# Patient Record
Sex: Male | Born: 1985
Health system: Southern US, Community
[De-identification: ages and names within clinical notes are randomized; demographics above are authoritative.]

## PROBLEM LIST (undated history)

## (undated) DIAGNOSIS — Z21 Asymptomatic human immunodeficiency virus [HIV] infection status: Secondary | ICD-10-CM

## (undated) DIAGNOSIS — D693 Immune thrombocytopenic purpura: Secondary | ICD-10-CM

## (undated) DIAGNOSIS — B20 Human immunodeficiency virus [HIV] disease: Secondary | ICD-10-CM

## (undated) HISTORY — DX: Human immunodeficiency virus (HIV) disease: B20

## (undated) HISTORY — DX: Asymptomatic human immunodeficiency virus (hiv) infection status: Z21

## (undated) HISTORY — DX: Immune thrombocytopenic purpura: D69.3

---

## 2008-12-04 ENCOUNTER — Emergency Department (HOSPITAL_COMMUNITY): Admission: EM | Admit: 2008-12-04 | Discharge: 2008-12-04 | Payer: Self-pay | Admitting: Emergency Medicine

## 2009-04-04 ENCOUNTER — Emergency Department (HOSPITAL_COMMUNITY): Admission: EM | Admit: 2009-04-04 | Discharge: 2009-04-04 | Payer: Self-pay | Admitting: Emergency Medicine

## 2010-03-06 ENCOUNTER — Emergency Department (HOSPITAL_COMMUNITY): Admission: EM | Admit: 2010-03-06 | Discharge: 2010-03-06 | Payer: Self-pay | Admitting: Family Medicine

## 2011-08-09 ENCOUNTER — Emergency Department (HOSPITAL_COMMUNITY)
Admission: EM | Admit: 2011-08-09 | Discharge: 2011-08-09 | Disposition: A | Discharge status: Home / Self Care | Payer: Self-pay | Attending: Emergency Medicine | Admitting: Emergency Medicine

## 2011-08-09 DIAGNOSIS — J329 Chronic sinusitis, unspecified: Secondary | ICD-10-CM | POA: Insufficient documentation

## 2011-08-09 DIAGNOSIS — H9209 Otalgia, unspecified ear: Secondary | ICD-10-CM | POA: Insufficient documentation

## 2011-08-09 DIAGNOSIS — J3489 Other specified disorders of nose and nasal sinuses: Secondary | ICD-10-CM | POA: Insufficient documentation

## 2011-08-09 LAB — RAPID STREP SCREEN (MED CTR MEBANE ONLY): Streptococcus, Group A Screen (Direct): NEGATIVE

## 2012-05-07 ENCOUNTER — Emergency Department (HOSPITAL_COMMUNITY)
Admission: EM | Admit: 2012-05-07 | Discharge: 2012-05-07 | Disposition: A | Discharge status: Home / Self Care | Payer: Self-pay | Attending: Emergency Medicine | Admitting: Emergency Medicine

## 2012-05-07 ENCOUNTER — Encounter (HOSPITAL_COMMUNITY): Payer: Self-pay | Admitting: Emergency Medicine

## 2012-05-07 DIAGNOSIS — F17200 Nicotine dependence, unspecified, uncomplicated: Secondary | ICD-10-CM | POA: Insufficient documentation

## 2012-05-07 DIAGNOSIS — K0500 Acute gingivitis, plaque induced: Secondary | ICD-10-CM

## 2012-05-07 DIAGNOSIS — K0889 Other specified disorders of teeth and supporting structures: Secondary | ICD-10-CM

## 2012-05-07 DIAGNOSIS — K089 Disorder of teeth and supporting structures, unspecified: Secondary | ICD-10-CM | POA: Insufficient documentation

## 2012-05-07 MED ORDER — PENICILLIN V POTASSIUM 500 MG PO TABS: 500.0000 mg | ORAL_TABLET | Freq: Three times a day (TID) | ORAL | Status: AC

## 2012-05-07 MED ORDER — HYDROCODONE-ACETAMINOPHEN 5-325 MG PO TABS: 2.0000 | ORAL_TABLET | ORAL | Status: AC | PRN

## 2012-05-07 NOTE — ED Notes (Addendum)
C/o toothache and gums bleeding x 1 week.

## 2012-05-07 NOTE — ED Provider Notes (Signed)
History     CSN: [id number]  Arrival date & time 05/07/12  1826   First MD Initiated Contact with Patient 05/07/12 1930      Chief Complaint  Patient presents with  . Dental Pain    (Consider location/radiation/quality/duration/timing/severity/associated sxs/prior treatment) HPI Comments: Patient presents with lower incisor teeth pain and gingival pain and swelling for the past week.  He does not have a dentist.    Patient is a 26 y.o. male presenting with tooth pain. The history is provided by the patient.  Dental PainThe primary symptoms include mouth pain and oral bleeding. Primary symptoms do not include dental injury, oral lesions or fever. The symptoms are worsening. The symptoms are new. The symptoms occur constantly.  Additional symptoms include: dental sensitivity to temperature, gum swelling and gum tenderness. Additional symptoms do not include: purulent gums, trismus, facial swelling, trouble swallowing, pain with swallowing, excessive salivation and drooling.    History reviewed. No pertinent past medical history.  History reviewed. No pertinent past surgical history.  No family history on file.  History  Substance Use Topics  . Smoking status: Current Everyday Smoker  . Smokeless tobacco: Not on file  . Alcohol Use: No      Review of Systems  Constitutional: Negative for fever and chills.  HENT: Positive for dental problem. Negative for facial swelling, drooling, trouble swallowing, neck pain, neck stiffness and voice change.   Gastrointestinal: Negative for nausea and vomiting.  Skin: Negative for color change.    Allergies  Review of patient's allergies indicates no known allergies.  Home Medications  No current outpatient prescriptions on file.  BP 126/85  Pulse 60  Temp 98.5 F (36.9 C) (Oral)  Resp 17  Ht 6\' 2"  (1.88 m)  Wt 180 lb (81.647 kg)  BMI 23.11 kg/m2  SpO2 99%  Physical Exam  Nursing note and vitals reviewed. Constitutional: He  is oriented to person, place, and time. He appears well-developed and well-nourished. No distress.  HENT:  Head: Normocephalic and atraumatic. No trismus in the jaw.  Mouth/Throat: Uvula is midline, oropharynx is clear and moist and mucous membranes are normal. Abnormal dentition. No dental abscesses or uvula swelling. No oropharyngeal exudate, posterior oropharyngeal edema, posterior oropharyngeal erythema or tonsillar abscesses.       Poor dental hygiene. Pt able to open and close mouth with out difficulty. Airway intact. Uvula midline. Mild gingival swelling with tenderness over affected area, but no fluctuance. No swelling or tenderness of submental and submandibular regions.  Neck: Normal range of motion and full passive range of motion without pain. Neck supple.  Cardiovascular: Normal rate, regular rhythm and normal heart sounds.   Pulmonary/Chest: Effort normal and breath sounds normal. No respiratory distress. He has no wheezes.  Musculoskeletal: Normal range of motion.  Lymphadenopathy:       Head (right side): No submental, no submandibular, no tonsillar, no preauricular and no posterior auricular adenopathy present.       Head (left side): No submental, no submandibular, no tonsillar, no preauricular and no posterior auricular adenopathy present.    He has no cervical adenopathy.  Neurological: He is alert and oriented to person, place, and time.  Skin: Skin is warm and dry. No rash noted. He is not diaphoretic.    ED Course  Procedures (including critical care time)  Labs Reviewed - No data to display No results found.   No diagnosis found.    MDM  Patient with toothache.  No gross abscess.  Exam unconcerning for Ludwig's angina or spread of infection.  Will treat with penicillin and pain medicine.  Urged patient to follow-up with dentist.          Pascal Lux Epes, PA-C 05/08/12 0131

## 2012-05-08 NOTE — ED Provider Notes (Signed)
Medical screening examination/treatment/procedure(s) were performed by non-physician practitioner and as supervising physician I was immediately available for consultation/collaboration.  Juliet Rude. Rubin Payor, MD 05/08/12 [phone]

## 2012-06-24 ENCOUNTER — Emergency Department (HOSPITAL_COMMUNITY)
Admission: EM | Admit: 2012-06-24 | Discharge: 2012-06-24 | Disposition: A | Discharge status: Home / Self Care | Payer: Self-pay | Attending: Emergency Medicine | Admitting: Emergency Medicine

## 2012-06-24 DIAGNOSIS — J329 Chronic sinusitis, unspecified: Secondary | ICD-10-CM | POA: Insufficient documentation

## 2012-06-24 DIAGNOSIS — B9789 Other viral agents as the cause of diseases classified elsewhere: Secondary | ICD-10-CM | POA: Insufficient documentation

## 2012-06-24 MED ORDER — AMOXICILLIN-POT CLAVULANATE 875-125 MG PO TABS: 1.0000 | ORAL_TABLET | Freq: Two times a day (BID) | ORAL | Status: DC

## 2012-06-24 MED ORDER — CETIRIZINE-PSEUDOEPHEDRINE ER 5-120 MG PO TB12: 1.0000 | ORAL_TABLET | Freq: Every day | ORAL | Status: DC

## 2012-06-24 MED ORDER — OXYMETAZOLINE HCL 0.05 % NA SOLN
1.0000 | Freq: Once | NASAL | Status: AC
  Administered 2012-06-24: 1 via NASAL
  Filled 2012-06-24: qty 15

## 2012-06-24 MED ORDER — MOMETASONE FUROATE 50 MCG/ACT NA SUSP: 2.0000 | Freq: Every day | NASAL | Status: DC

## 2012-06-24 NOTE — ED Notes (Signed)
Pt verbalizes understanding 

## 2012-06-24 NOTE — ED Notes (Signed)
Pt reports feeling dizzy, congestion and "stuffy ears"  Unrelieved by any over the counter meds.  Pt denies chills or fever.  Pt reports bleeding/sensitive gums

## 2012-06-26 NOTE — ED Notes (Signed)
Pt was prescribed Nasonex by [facility] PA, it was too expensive for pt per pharmacist.  PA Kirichenko wrote for Flonase 1 bottle, sig: 2 sprays in each nostril Q day.  Called into [facility] at [phone].

## 2012-07-05 NOTE — ED Provider Notes (Signed)
History     CSN: [id number]  Arrival date & time 06/24/12  1858   First MD Initiated Contact with Patient 06/24/12 1945      Chief Complaint  Patient presents with  . Facial Pain    (Consider location/radiation/quality/duration/timing/severity/associated sxs/prior treatment) HPI Comments: Patient presents with a 2 day history of sinus congestion. The symptoms started gradually and are progressively worsening. The patient has tried over the counter medication without relief. Patient reports associated dizziness, sensitive gums and "stuffy ears." Patient denies fever, chills, SOB, cough, chest pain, NVD, abdominal pain.    No past medical history on file.  No past surgical history on file.  No family history on file.  History  Substance Use Topics  . Smoking status: Current Every Day Smoker  . Smokeless tobacco: Not on file  . Alcohol Use: No      Review of Systems  HENT: Positive for congestion and sinus pressure.   Neurological: Positive for dizziness.  All other systems reviewed and are negative.    Allergies  Review of patient's allergies indicates no known allergies.  Home Medications   Current Outpatient Rx  Name Route Sig Dispense Refill  . AMOXICILLIN-POT CLAVULANATE 875-125 MG PO TABS Oral Take 1 tablet by mouth every 12 (twelve) hours. 14 tablet 0    Fill this prescription if you develop a fever grea ...  . CETIRIZINE-PSEUDOEPHEDRINE ER 5-120 MG PO TB12 Oral Take 1 tablet by mouth daily. 30 tablet 0  . MOMETASONE FUROATE 50 MCG/ACT NA SUSP Nasal Place 2 sprays into the nose daily. 17 g 12    BP 123/80  Pulse 70  Temp 98.1 F (36.7 C) (Oral)  Resp 14  SpO2 100%  Physical Exam  Nursing note and vitals reviewed. Constitutional: He is oriented to person, place, and time. He appears well-developed and well-nourished. No distress.  HENT:  Head: Normocephalic and atraumatic.  Mouth/Throat: Oropharynx is clear and moist. No oropharyngeal exudate.    Facial tenderness to palpation over maxillary and ethmoid sinuses.   Eyes: Conjunctivae normal and EOM are normal. Pupils are equal, round, and reactive to light. No scleral icterus.  Neck: Normal range of motion. Neck supple.  Cardiovascular: Normal rate and regular rhythm.  Exam reveals no gallop and no friction rub.   No murmur heard. Pulmonary/Chest: Effort normal. No respiratory distress. He has no wheezes. He has no rales. He exhibits no tenderness.  Abdominal: Soft. He exhibits no distension. There is no tenderness. There is no rebound.  Musculoskeletal: Normal range of motion.  Neurological: He is alert and oriented to person, place, and time. Coordination normal.  Skin: Skin is warm and dry. He is not diaphoretic.  Psychiatric: He has a normal mood and affect. His behavior is normal.    ED Course  Procedures (including critical care time)  Labs Reviewed - No data to display No results found.   1. Viral sinusitis       MDM  4:16 PM Patient most likely experiencing sinusitis that appears viral. I will treat him symptomatically with Zyrtec-D and Nasonex. I have instructed him to fill the prescription for augmentin if symptoms do not improve within 2 days. No further evaluation needed at this time.         Emilia Beck, PA-C 07/05/12 1623

## 2012-07-07 NOTE — ED Provider Notes (Signed)
Medical screening examination/treatment/procedure(s) were performed by non-physician practitioner and as supervising physician I was immediately available for consultation/collaboration.  Faysal Fenoglio T Bronnie Vasseur, MD 07/07/12 0806 

## 2013-10-17 ENCOUNTER — Emergency Department (HOSPITAL_COMMUNITY)
Admission: EM | Admit: 2013-10-17 | Discharge: 2013-10-17 | Disposition: A | Discharge status: Home / Self Care | Payer: Self-pay | Attending: Emergency Medicine | Admitting: Emergency Medicine

## 2013-10-17 ENCOUNTER — Encounter (HOSPITAL_COMMUNITY): Payer: Self-pay | Admitting: Emergency Medicine

## 2013-10-17 DIAGNOSIS — J329 Chronic sinusitis, unspecified: Secondary | ICD-10-CM | POA: Insufficient documentation

## 2013-10-17 DIAGNOSIS — K029 Dental caries, unspecified: Secondary | ICD-10-CM | POA: Insufficient documentation

## 2013-10-17 DIAGNOSIS — R040 Epistaxis: Secondary | ICD-10-CM | POA: Insufficient documentation

## 2013-10-17 DIAGNOSIS — F17200 Nicotine dependence, unspecified, uncomplicated: Secondary | ICD-10-CM | POA: Insufficient documentation

## 2013-10-17 MED ORDER — AMOXICILLIN-POT CLAVULANATE 875-125 MG PO TABS
1.0000 | ORAL_TABLET | Freq: Once | ORAL | Status: AC
  Administered 2013-10-17: 1 via ORAL
  Filled 2013-10-17: qty 1

## 2013-10-17 MED ORDER — AMOXICILLIN-POT CLAVULANATE 875-125 MG PO TABS: 1.0000 | ORAL_TABLET | Freq: Two times a day (BID) | ORAL | Status: DC

## 2013-10-17 MED ORDER — IPRATROPIUM BROMIDE 0.03 % NA SOLN
2.0000 | Freq: Two times a day (BID) | NASAL | Status: DC
  Administered 2013-10-17: 2 via NASAL
  Filled 2013-10-17: qty 30

## 2013-10-17 NOTE — ED Provider Notes (Signed)
CSN: [id number]     Arrival date & time 10/17/13  0001 History   First MD Initiated Contact with Patient 10/17/13 0017     Chief Complaint  Patient presents with  . Influenza   (Consider location/radiation/quality/duration/timing/severity/associated sxs/prior Treatment) Patient is a 28 y.o. male presenting with flu symptoms. The history is provided by the patient.  Influenza Presenting symptoms: headache   Presenting symptoms: no cough, no diarrhea, no fatigue, no fever, no myalgias, no nausea, no rhinorrhea, no shortness of breath, no sore throat and no vomiting   Severity:  Moderate Onset quality:  Gradual Duration:  3 weeks Progression:  Unchanged Chronicity:  New Relieved by:  Nothing Worsened by:  Nothing tried Ineffective treatments:  Decongestant and OTC medications Associated symptoms: nasal congestion   Associated symptoms comment:  Intermittent nose bleed, burning sensation after blowing nose   History reviewed. No pertinent past medical history. History reviewed. No pertinent past surgical history. History reviewed. No pertinent family history. History  Substance Use Topics  . Smoking status: Current Every Day Smoker  . Smokeless tobacco: Not on file  . Alcohol Use: No    Review of Systems  Constitutional: Negative for fever and fatigue.  HENT: Positive for congestion and nosebleeds. Negative for rhinorrhea, sinus pressure, sore throat and voice change.   Respiratory: Negative for cough and shortness of breath.   Gastrointestinal: Negative for nausea, vomiting and diarrhea.  Musculoskeletal: Negative for myalgias.  Neurological: Positive for headaches.    Allergies  Review of patient's allergies indicates no known allergies.  Home Medications   Current Outpatient Rx  Name  Route  Sig  Dispense  Refill  . acetaminophen (TYLENOL) 500 MG tablet   Oral   Take 500 mg by mouth every 6 (six) hours as needed for mild pain or headache.          . ibuprofen  (ADVIL,MOTRIN) 200 MG tablet   Oral   Take 400 mg by mouth every 6 (six) hours as needed for fever, headache or moderate pain.         Marland Kitchen amoxicillin-clavulanate (AUGMENTIN) 875-125 MG per tablet   Oral   Take 1 tablet by mouth 2 (two) times daily.   13 tablet   0    BP 128/77  Pulse 92  Temp(Src) 98.2 F (36.8 C) (Oral)  Resp 18  SpO2 98% Physical Exam  Vitals reviewed. Constitutional: He is oriented to person, place, and time. He appears well-developed and well-nourished.  HENT:  Head: Normocephalic.  Mouth/Throat: Uvula is midline. Dental caries present. No uvula swelling. Posterior oropharyngeal erythema present. No oropharyngeal exudate or posterior oropharyngeal edema.  L ear canal reddened, R TM dull   Eyes: Pupils are equal, round, and reactive to light.  Neck: Normal range of motion.  Cardiovascular: Normal rate and regular rhythm.   Pulmonary/Chest: Effort normal and breath sounds normal.  Musculoskeletal: Normal range of motion.  Lymphadenopathy:       Head (right side): No tonsillar and no preauricular adenopathy present.       Head (left side): No tonsillar and no preauricular adenopathy present.    He has cervical adenopathy.       Right cervical: Superficial cervical adenopathy present.       Left cervical: Superficial cervical adenopathy present.  Neurological: He is alert and oriented to person, place, and time.  Skin: Skin is warm.    ED Course  Procedures (including critical care time) Labs Review Labs Reviewed - No data to  display Imaging Review No results found.  EKG Interpretation   None       MDM   1. Sinusitis     Patient obtained significant relief of his nasal congestion after the use of Atrovent nasal spray.  He will be discharged home with same with instructions to use 2 sprays to each nostril 4 times a day for 3-4, days.  He's also been given a prescription for Augmentin due to the length of his symptoms    Garald Balding,  NP 10/17/13 0245

## 2013-10-17 NOTE — Discharge Instructions (Signed)

## 2013-10-17 NOTE — ED Provider Notes (Signed)
Medical screening examination/treatment/procedure(s) were performed by non-physician practitioner and as supervising physician I was immediately available for consultation/collaboration.    Kalman Drape, MD 10/17/13 [phone]

## 2013-10-17 NOTE — ED Notes (Signed)
Pt arrived to the Ed with a complaint of flu like symptoms.  Pt is congested, has a runny nose, a headache from blowing his nose too hard, shortness of breath.  Pt has taken mucinex and tylenol without any relief.  Symptoms have been present for three weeks

## 2013-11-16 ENCOUNTER — Inpatient Hospital Stay (HOSPITAL_COMMUNITY)
Admission: AD | Admit: 2013-11-16 | Discharge: 2013-11-18 | DRG: 133 | Disposition: A | Discharge status: Home / Self Care | Payer: Self-pay | Source: Ambulatory Visit | Attending: Internal Medicine | Admitting: Internal Medicine

## 2013-11-16 ENCOUNTER — Encounter (HOSPITAL_COMMUNITY): Payer: Self-pay | Admitting: Emergency Medicine

## 2013-11-16 ENCOUNTER — Other Ambulatory Visit (HOSPITAL_COMMUNITY): Payer: Self-pay | Admitting: Otolaryngology

## 2013-11-16 ENCOUNTER — Inpatient Hospital Stay (HOSPITAL_COMMUNITY): Payer: Self-pay | Admitting: Certified Registered"

## 2013-11-16 ENCOUNTER — Other Ambulatory Visit: Payer: Self-pay | Admitting: Otolaryngology

## 2013-11-16 ENCOUNTER — Ambulatory Visit (HOSPITAL_COMMUNITY)
Admission: RE | Admit: 2013-11-16 | Discharge: 2013-11-16 | Disposition: A | Discharge status: Home / Self Care | Payer: Self-pay | Source: Ambulatory Visit | Attending: Otolaryngology | Admitting: Otolaryngology

## 2013-11-16 ENCOUNTER — Encounter (HOSPITAL_COMMUNITY): Payer: Self-pay | Admitting: Certified Registered"

## 2013-11-16 ENCOUNTER — Emergency Department (HOSPITAL_COMMUNITY)
Admission: EM | Admit: 2013-11-16 | Discharge: 2013-11-16 | Disposition: A | Discharge status: Home / Self Care | Payer: Self-pay | Attending: Emergency Medicine | Admitting: Emergency Medicine

## 2013-11-16 ENCOUNTER — Encounter (HOSPITAL_COMMUNITY): Payer: Self-pay | Admitting: *Deleted

## 2013-11-16 ENCOUNTER — Encounter (HOSPITAL_COMMUNITY): Payer: Self-pay

## 2013-11-16 ENCOUNTER — Encounter (HOSPITAL_COMMUNITY)
Admission: AD | Disposition: A | Discharge status: Home / Self Care | Payer: Self-pay | Source: Ambulatory Visit | Attending: Otolaryngology

## 2013-11-16 DIAGNOSIS — J392 Other diseases of pharynx: Secondary | ICD-10-CM | POA: Diagnosis present

## 2013-11-16 DIAGNOSIS — F17200 Nicotine dependence, unspecified, uncomplicated: Secondary | ICD-10-CM | POA: Diagnosis present

## 2013-11-16 DIAGNOSIS — B20 Human immunodeficiency virus [HIV] disease: Secondary | ICD-10-CM | POA: Diagnosis present

## 2013-11-16 DIAGNOSIS — H6690 Otitis media, unspecified, unspecified ear: Secondary | ICD-10-CM | POA: Diagnosis present

## 2013-11-16 DIAGNOSIS — R040 Epistaxis: Secondary | ICD-10-CM

## 2013-11-16 DIAGNOSIS — D489 Neoplasm of uncertain behavior, unspecified: Secondary | ICD-10-CM

## 2013-11-16 DIAGNOSIS — J352 Hypertrophy of adenoids: Principal | ICD-10-CM | POA: Diagnosis present

## 2013-11-16 DIAGNOSIS — R740 Nonspecific elevation of levels of transaminase and lactic acid dehydrogenase [LDH]: Secondary | ICD-10-CM

## 2013-11-16 DIAGNOSIS — R748 Abnormal levels of other serum enzymes: Secondary | ICD-10-CM | POA: Diagnosis present

## 2013-11-16 DIAGNOSIS — B1920 Unspecified viral hepatitis C without hepatic coma: Secondary | ICD-10-CM | POA: Diagnosis present

## 2013-11-16 DIAGNOSIS — D649 Anemia, unspecified: Secondary | ICD-10-CM | POA: Diagnosis present

## 2013-11-16 DIAGNOSIS — R768 Other specified abnormal immunological findings in serum: Secondary | ICD-10-CM | POA: Diagnosis present

## 2013-11-16 DIAGNOSIS — D696 Thrombocytopenia, unspecified: Secondary | ICD-10-CM | POA: Diagnosis present

## 2013-11-16 DIAGNOSIS — R7401 Elevation of levels of liver transaminase levels: Secondary | ICD-10-CM | POA: Diagnosis present

## 2013-11-16 DIAGNOSIS — H6520 Chronic serous otitis media, unspecified ear: Secondary | ICD-10-CM | POA: Diagnosis present

## 2013-11-16 DIAGNOSIS — Z79899 Other long term (current) drug therapy: Secondary | ICD-10-CM

## 2013-11-16 DIAGNOSIS — Z23 Encounter for immunization: Secondary | ICD-10-CM

## 2013-11-16 DIAGNOSIS — R599 Enlarged lymph nodes, unspecified: Secondary | ICD-10-CM | POA: Diagnosis present

## 2013-11-16 HISTORY — PX: MYRINGOTOMY WITH TUBE PLACEMENT: SHX5663

## 2013-11-16 HISTORY — PX: NASAL ENDOSCOPY WITH EPISTAXIS CONTROL: SHX5664

## 2013-11-16 HISTORY — PX: ADENOIDECTOMY: SHX5191

## 2013-11-16 LAB — CBC WITH DIFFERENTIAL/PLATELET
BASOS ABS: 0 10*3/uL (ref 0.0–0.1)
Basophils Relative: 1 % (ref 0–1)
Eosinophils Absolute: 0.1 10*3/uL (ref 0.0–0.7)
Eosinophils Relative: 1 % (ref 0–5)
HEMATOCRIT: 36 % — AB (ref 39.0–52.0)
Hemoglobin: 12.2 g/dL — ABNORMAL LOW (ref 13.0–17.0)
LYMPHS PCT: 37 % (ref 12–46)
Lymphs Abs: 1.9 10*3/uL (ref 0.7–4.0)
MCH: 27.9 pg (ref 26.0–34.0)
MCHC: 33.9 g/dL (ref 30.0–36.0)
MCV: 82.2 fL (ref 78.0–100.0)
MONO ABS: 0.8 10*3/uL (ref 0.1–1.0)
Monocytes Relative: 15 % — ABNORMAL HIGH (ref 3–12)
NEUTROS ABS: 2.3 10*3/uL (ref 1.7–7.7)
Neutrophils Relative %: 46 % (ref 43–77)
PLATELETS: 41 10*3/uL — AB (ref 150–400)
RBC: 4.38 MIL/uL (ref 4.22–5.81)
RDW: 14.3 % (ref 11.5–15.5)
WBC: 5 10*3/uL (ref 4.0–10.5)

## 2013-11-16 LAB — POCT I-STAT 4, (NA,K, GLUC, HGB,HCT)
Glucose, Bld: 103 mg/dL — ABNORMAL HIGH (ref 70–99)
HCT: 30 % — ABNORMAL LOW (ref 39.0–52.0)
Hemoglobin: 10.2 g/dL — ABNORMAL LOW (ref 13.0–17.0)
Potassium: 3.9 mEq/L (ref 3.7–5.3)
SODIUM: 139 meq/L (ref 137–147)

## 2013-11-16 LAB — PROTIME-INR
INR: 1.09 (ref 0.00–1.49)
PROTHROMBIN TIME: 13.9 s (ref 11.6–15.2)

## 2013-11-16 LAB — APTT: APTT: 39 s — AB (ref 24–37)

## 2013-11-16 LAB — HIV ANTIBODY (ROUTINE TESTING W REFLEX): HIV: REACTIVE — AB

## 2013-11-16 SURGERY — CONTROL OF EPISTAXIS, ENDOSCOPIC
Anesthesia: General | Site: Nose

## 2013-11-16 MED ORDER — CIPROFLOXACIN-DEXAMETHASONE 0.3-0.1 % OT SUSP
OTIC | Status: DC | PRN
  Administered 2013-11-16: 4 [drp] via OTIC

## 2013-11-16 MED ORDER — NEOSTIGMINE METHYLSULFATE 1 MG/ML IJ SOLN
INTRAMUSCULAR | Status: DC | PRN
  Administered 2013-11-16: 4 mg via INTRAVENOUS

## 2013-11-16 MED ORDER — HYDROMORPHONE HCL PF 1 MG/ML IJ SOLN
0.2500 mg | INTRAMUSCULAR | Status: DC | PRN
  Administered 2013-11-16: 0.25 mg via INTRAVENOUS

## 2013-11-16 MED ORDER — IMMUNE GLOBULIN (HUMAN) 20 GM/200ML IV SOLN
80.0000 g | INTRAVENOUS | Status: AC
  Administered 2013-11-16 – 2013-11-18 (×2): 80 g via INTRAVENOUS
  Filled 2013-11-16 (×3): qty 800

## 2013-11-16 MED ORDER — SODIUM CHLORIDE 0.9 % IR SOLN
Status: DC | PRN
  Administered 2013-11-16: 1000 mL

## 2013-11-16 MED ORDER — LACTATED RINGERS IV SOLN
INTRAVENOUS | Status: DC
  Administered 2013-11-16 (×3): via INTRAVENOUS

## 2013-11-16 MED ORDER — PROPOFOL 10 MG/ML IV BOLUS
INTRAVENOUS | Status: DC | PRN
  Administered 2013-11-16: 350 mg via INTRAVENOUS

## 2013-11-16 MED ORDER — LIDOCAINE-EPINEPHRINE 1 %-1:100000 IJ SOLN
INTRAMUSCULAR | Status: AC
  Filled 2013-11-16: qty 1

## 2013-11-16 MED ORDER — PROMETHAZINE HCL 25 MG RE SUPP: 25.0000 mg | Freq: Four times a day (QID) | RECTAL | Status: DC | PRN

## 2013-11-16 MED ORDER — SUCCINYLCHOLINE CHLORIDE 20 MG/ML IJ SOLN
INTRAMUSCULAR | Status: AC
Start: 2013-11-16 — End: 2013-11-16
  Filled 2013-11-16: qty 1

## 2013-11-16 MED ORDER — BACITRACIN-NEOMYCIN-POLYMYXIN 400-5-5000 EX OINT
TOPICAL_OINTMENT | CUTANEOUS | Status: AC
  Filled 2013-11-16: qty 1

## 2013-11-16 MED ORDER — CIPROFLOXACIN-DEXAMETHASONE 0.3-0.1 % OT SUSP
4.0000 [drp] | Freq: Two times a day (BID) | OTIC | Status: DC
  Administered 2013-11-16 – 2013-11-18 (×4): 4 [drp] via OTIC
  Filled 2013-11-16 (×2): qty 7.5

## 2013-11-16 MED ORDER — IOHEXOL 300 MG/ML  SOLN
100.0000 mL | Freq: Once | INTRAMUSCULAR | Status: AC | PRN
  Administered 2013-11-16: 100 mL via INTRAVENOUS

## 2013-11-16 MED ORDER — FENTANYL CITRATE 0.05 MG/ML IJ SOLN
INTRAMUSCULAR | Status: DC | PRN
  Administered 2013-11-16: 100 ug via INTRAVENOUS
  Administered 2013-11-16 (×3): 50 ug via INTRAVENOUS

## 2013-11-16 MED ORDER — OXYMETAZOLINE HCL 0.05 % NA SOLN
NASAL | Status: AC
  Filled 2013-11-16: qty 15

## 2013-11-16 MED ORDER — ACETAMINOPHEN 500 MG PO TABS
500.0000 mg | ORAL_TABLET | ORAL | Status: AC
  Administered 2013-11-16 – 2013-11-17 (×2): 500 mg via ORAL
  Filled 2013-11-16 (×4): qty 1

## 2013-11-16 MED ORDER — SALINE SPRAY 0.65 % NA SOLN
1.0000 | NASAL | Status: DC | PRN
  Filled 2013-11-16: qty 44

## 2013-11-16 MED ORDER — SODIUM CHLORIDE 0.9 % IV SOLN
40.0000 mg | INTRAVENOUS | Status: AC
  Administered 2013-11-16 – 2013-11-17 (×2): 40 mg via INTRAVENOUS
  Filled 2013-11-16 (×3): qty 4

## 2013-11-16 MED ORDER — AMOXICILLIN 250 MG/5ML PO SUSR
500.0000 mg | Freq: Three times a day (TID) | ORAL | Status: DC
  Administered 2013-11-16 – 2013-11-18 (×5): 500 mg via ORAL
  Filled 2013-11-16 (×10): qty 10

## 2013-11-16 MED ORDER — LIDOCAINE HCL (CARDIAC) 20 MG/ML IV SOLN
INTRAVENOUS | Status: AC
Start: 2013-11-16 — End: 2013-11-16
  Filled 2013-11-16: qty 5

## 2013-11-16 MED ORDER — GLYCOPYRROLATE 0.2 MG/ML IJ SOLN
INTRAMUSCULAR | Status: DC | PRN
  Administered 2013-11-16: 0.6 mg via INTRAVENOUS

## 2013-11-16 MED ORDER — COCAINE HCL 4 % EX SOLN
CUTANEOUS | Status: AC
  Filled 2013-11-16: qty 4

## 2013-11-16 MED ORDER — MIDAZOLAM HCL 2 MG/2ML IJ SOLN
INTRAMUSCULAR | Status: AC
  Filled 2013-11-16: qty 2

## 2013-11-16 MED ORDER — PROPOFOL 10 MG/ML IV BOLUS
INTRAVENOUS | Status: AC
  Filled 2013-11-16: qty 20

## 2013-11-16 MED ORDER — OXYMETAZOLINE HCL 0.05 % NA SOLN
2.0000 | NASAL | Status: DC
  Filled 2013-11-16: qty 15

## 2013-11-16 MED ORDER — ALBUMIN HUMAN 5 % IV SOLN
INTRAVENOUS | Status: DC | PRN
  Administered 2013-11-16: 18:00:00 via INTRAVENOUS

## 2013-11-16 MED ORDER — MORPHINE SULFATE 2 MG/ML IJ SOLN: 2.0000 mg | INTRAMUSCULAR | Status: DC | PRN

## 2013-11-16 MED ORDER — LIDOCAINE HCL (CARDIAC) 20 MG/ML IV SOLN
INTRAVENOUS | Status: DC | PRN
  Administered 2013-11-16: 100 mg via INTRAVENOUS

## 2013-11-16 MED ORDER — ROCURONIUM BROMIDE 100 MG/10ML IV SOLN
INTRAVENOUS | Status: DC | PRN
  Administered 2013-11-16: 20 mg via INTRAVENOUS

## 2013-11-16 MED ORDER — FENTANYL CITRATE 0.05 MG/ML IJ SOLN
INTRAMUSCULAR | Status: AC
  Filled 2013-11-16: qty 5

## 2013-11-16 MED ORDER — PROMETHAZINE HCL 25 MG PO TABS: 25.0000 mg | ORAL_TABLET | Freq: Four times a day (QID) | ORAL | Status: DC | PRN

## 2013-11-16 MED ORDER — MIDAZOLAM HCL 5 MG/5ML IJ SOLN
INTRAMUSCULAR | Status: DC | PRN
  Administered 2013-11-16: 2 mg via INTRAVENOUS

## 2013-11-16 MED ORDER — LIDOCAINE HCL 4 % IJ SOLN
INTRAMUSCULAR | Status: AC
  Filled 2013-11-16: qty 5

## 2013-11-16 MED ORDER — DEXTROSE-NACL 5-0.45 % IV SOLN
INTRAVENOUS | Status: DC
  Administered 2013-11-16 – 2013-11-17 (×3): via INTRAVENOUS

## 2013-11-16 MED ORDER — OXYMETAZOLINE HCL 0.05 % NA SOLN
NASAL | Status: DC | PRN
  Administered 2013-11-16: 1

## 2013-11-16 MED ORDER — CIPROFLOXACIN-DEXAMETHASONE 0.3-0.1 % OT SUSP
OTIC | Status: AC
  Filled 2013-11-16: qty 7.5

## 2013-11-16 MED ORDER — CEFAZOLIN SODIUM-DEXTROSE 2-3 GM-% IV SOLR
2.0000 g | Freq: Once | INTRAVENOUS | Status: AC
  Administered 2013-11-16: 2 g via INTRAVENOUS
  Filled 2013-11-16: qty 50

## 2013-11-16 MED ORDER — LIDOCAINE HCL (CARDIAC) 20 MG/ML IV SOLN
INTRAVENOUS | Status: AC
  Filled 2013-11-16: qty 5

## 2013-11-16 MED ORDER — HYDROCODONE-ACETAMINOPHEN 7.5-325 MG/15ML PO SOLN
10.0000 mL | ORAL | Status: DC | PRN
  Administered 2013-11-17: 15 mL via ORAL
  Filled 2013-11-16: qty 15

## 2013-11-16 MED ORDER — OXYCODONE HCL 5 MG PO TABS: 5.0000 mg | ORAL_TABLET | Freq: Once | ORAL | Status: DC | PRN

## 2013-11-16 MED ORDER — HYDROMORPHONE HCL PF 1 MG/ML IJ SOLN
INTRAMUSCULAR | Status: AC
  Filled 2013-11-16: qty 1

## 2013-11-16 MED ORDER — METHYLPREDNISOLONE SODIUM SUCC 125 MG IJ SOLR
80.0000 mg | INTRAMUSCULAR | Status: AC
  Administered 2013-11-16 – 2013-11-17 (×2): 80 mg via INTRAVENOUS
  Filled 2013-11-16: qty 1.28
  Filled 2013-11-16: qty 2
  Filled 2013-11-16: qty 1.28

## 2013-11-16 MED ORDER — OXYCODONE HCL 5 MG/5ML PO SOLN: 5.0000 mg | Freq: Once | ORAL | Status: DC | PRN

## 2013-11-16 MED ORDER — ONDANSETRON HCL 4 MG/2ML IJ SOLN
INTRAMUSCULAR | Status: DC | PRN
  Administered 2013-11-16: 4 mg via INTRAVENOUS

## 2013-11-16 MED ORDER — ONDANSETRON HCL 4 MG/2ML IJ SOLN: 4.0000 mg | Freq: Once | INTRAMUSCULAR | Status: DC | PRN

## 2013-11-16 SURGICAL SUPPLY — 64 items
ATTRACTOMAT 16X20 MAGNETIC DRP (DRAPES) IMPLANT
BLADE RAD40 ROTATE 4M 4 5PK (BLADE) IMPLANT
BLADE RAD40 ROTATE 4M 4MM 5PK (BLADE)
BLADE RAD60 ROTATE M4 4 5PK (BLADE) IMPLANT
BLADE RAD60 ROTATE M4 4MM 5PK (BLADE)
BLADE TRICUT ROTATE M4 4 5PK (BLADE) IMPLANT
BLADE TRICUT ROTATE M4 4MM 5PK (BLADE)
CANISTER SUCTION 2500CC (MISCELLANEOUS) ×6 IMPLANT
CATH ROBINSON RED A/P 10FR (CATHETERS) ×12 IMPLANT
CLEANER TIP ELECTROSURG 2X2 (MISCELLANEOUS) IMPLANT
CLOTH BEACON ORANGE TIMEOUT ST (SAFETY) IMPLANT
COAGULATOR SUCT 6 FR SWTCH (ELECTROSURGICAL) ×2
COAGULATOR SUCT SWTCH 10FR 6 (ELECTROSURGICAL) ×10 IMPLANT
CONT SPEC 4OZ CLIKSEAL STRL BL (MISCELLANEOUS) ×12 IMPLANT
COTTONBALL LRG STERILE PKG (GAUZE/BANDAGES/DRESSINGS) ×12 IMPLANT
COVER TABLE BACK 60X90 (DRAPES) IMPLANT
CRADLE DONUT ADULT HEAD (MISCELLANEOUS) ×6 IMPLANT
DECANTER SPIKE VIAL GLASS SM (MISCELLANEOUS) IMPLANT
DRAPE PROXIMA HALF (DRAPES) IMPLANT
DRESSING TELFA 8X3 (GAUZE/BANDAGES/DRESSINGS) ×6 IMPLANT
ELECT COATED BLADE 2.86 ST (ELECTRODE) ×6 IMPLANT
ELECT REM PT RETURN 9FT ADLT (ELECTROSURGICAL) ×6
ELECTRODE REM PT RTRN 9FT ADLT (ELECTROSURGICAL) ×4 IMPLANT
FILTER ARTHROSCOPY CONVERTOR (FILTER) IMPLANT
GAUZE PACKING FOLDED 2  STR (GAUZE/BANDAGES/DRESSINGS) ×2
GAUZE PACKING FOLDED 2 STR (GAUZE/BANDAGES/DRESSINGS) ×4 IMPLANT
GAUZE SPONGE 4X4 16PLY XRAY LF (GAUZE/BANDAGES/DRESSINGS) IMPLANT
GLOVE ECLIPSE 8.0 STRL XLNG CF (GLOVE) ×12 IMPLANT
GOWN PREVENTION PLUS XLARGE (GOWN DISPOSABLE) ×6 IMPLANT
GOWN STRL NON-REIN LRG LVL3 (GOWN DISPOSABLE) ×12 IMPLANT
GUARD TEETH (MISCELLANEOUS) IMPLANT
KIT BASIN OR (CUSTOM PROCEDURE TRAY) ×6 IMPLANT
KIT ROOM TURNOVER OR (KITS) ×6 IMPLANT
MARKER SKIN DUAL TIP RULER LAB (MISCELLANEOUS) IMPLANT
NEEDLE SPNL 22GX3.5 QUINCKE BK (NEEDLE) IMPLANT
NEEDLE SPNL 25GX3.5 QUINCKE BL (NEEDLE) IMPLANT
NS IRRIG 1000ML POUR BTL (IV SOLUTION) ×6 IMPLANT
PACK SURGICAL SETUP 50X90 (CUSTOM PROCEDURE TRAY) ×6 IMPLANT
PAD ARMBOARD 7.5X6 YLW CONV (MISCELLANEOUS) ×6 IMPLANT
PATTIES SURGICAL .5 X3 (DISPOSABLE) ×6 IMPLANT
PATTIES SURGICAL 1X1 (DISPOSABLE) ×6 IMPLANT
PENCIL BUTTON HOLSTER BLD 10FT (ELECTRODE) ×6 IMPLANT
PENCIL FOOT CONTROL (ELECTRODE) IMPLANT
SHEATH ENDOSCRUB 0 DEG (SHEATH) IMPLANT
SHEATH ENDOSCRUB 30 DEG (SHEATH) IMPLANT
SHEATH ENDOSCRUB 45 DEG (SHEATH) IMPLANT
SOLUTION ANTI FOG 6CC (MISCELLANEOUS) ×6 IMPLANT
SPECIMEN JAR SMALL (MISCELLANEOUS) ×12 IMPLANT
SPONGE GAUZE 4X4 12PLY (GAUZE/BANDAGES/DRESSINGS) IMPLANT
SPONGE INTESTINAL PEANUT (DISPOSABLE) IMPLANT
SPONGE TONSIL 1 RF SGL (DISPOSABLE) IMPLANT
SURGILUBE 2OZ TUBE FLIPTOP (MISCELLANEOUS) IMPLANT
SUT SILK 2 0 FS (SUTURE) IMPLANT
SUT SILK 2 0 SH (SUTURE) ×6 IMPLANT
SYR BULB 3OZ (MISCELLANEOUS) ×6 IMPLANT
SYR CONTROL 10ML LL (SYRINGE) IMPLANT
TOWEL OR 17X24 6PK STRL BLUE (TOWEL DISPOSABLE) ×12 IMPLANT
TOWEL OR 17X26 10 PK STRL BLUE (TOWEL DISPOSABLE) ×6 IMPLANT
TRAP SPECIMEN MUCOUS 40CC (MISCELLANEOUS) IMPLANT
TRAY ENT MC OR (CUSTOM PROCEDURE TRAY) ×6 IMPLANT
TUBE CONNECTING 12'X1/4 (SUCTIONS) ×1
TUBE CONNECTING 12X1/4 (SUCTIONS) ×5 IMPLANT
WATER STERILE IRR 1000ML POUR (IV SOLUTION) IMPLANT
YANKAUER SUCT BULB TIP NO VENT (SUCTIONS) ×6 IMPLANT

## 2013-11-16 NOTE — Anesthesia Procedure Notes (Signed)
Procedure Name: Intubation Date/Time: 11/16/2013 4:25 PM Performed by: Izora Gala Pre-anesthesia Checklist: Patient identified, Emergency Drugs available, Suction available and Patient being monitored Patient Re-evaluated:Patient Re-evaluated prior to inductionOxygen Delivery Method: Circle system utilized Preoxygenation: Pre-oxygenation with 100% oxygen Intubation Type: IV induction Ventilation: Mask ventilation without difficulty Laryngoscope Size: Miller and 2 Grade View: Grade I Tube type: Oral Tube size: 7.0 mm Number of attempts: 1 Airway Equipment and Method: Stylet Placement Confirmation: ETT inserted through vocal cords under direct vision,  positive ETCO2 and breath sounds checked- equal and bilateral Secured at: 23 cm Tube secured with: Tape Dental Injury: Teeth and Oropharynx as per pre-operative assessment

## 2013-11-16 NOTE — Consult Note (Signed)
#   [id number] is consult note.  I suspect that he is HIV (+).  His mom says that he is gay.  I do NOT suspect TTP, HUS, or malignancy.  I would treat this with IVIG as he is bleeding and the success with HIV-associated thrombocytopenia is quite high.  I would give him 2 doses of IVIG.  He probably needs a CT scan to assess for systemic lymphadenopathy.  I believe that he will need to stay in the hospital for 2-3 days to get his w/u completed properly.  I spoke to his parents tonight and told them what I thought we were dealing with.  They are very nice and took the preliminary news about as will as could be expected.  Dr. Erik Obey will get ID and the Hospitalists involved.  Pete E.

## 2013-11-16 NOTE — Anesthesia Preprocedure Evaluation (Addendum)
Anesthesia Evaluation  Patient identified by MRN, date of birth, ID band Patient awake    Reviewed: Allergy & Precautions, H&P , NPO status , Patient's Chart, lab work & pertinent test results  History of Anesthesia Complications Negative for: history of anesthetic complications (no prior surgeries)  Airway Mallampati: I TM Distance: >3 FB Neck ROM: Full    Dental  (+) Teeth Intact, Dental Advisory Given   Pulmonary Current Smoker,  breath sounds clear to auscultation        Cardiovascular Rhythm:Regular Rate:Normal     Neuro/Psych    GI/Hepatic   Endo/Other    Renal/GU      Musculoskeletal   Abdominal   Peds  Hematology   Anesthesia Other Findings   Reproductive/Obstetrics                         Anesthesia Physical Anesthesia Plan  ASA: II  Anesthesia Plan: General   Post-op Pain Management:    Induction: Intravenous  Airway Management Planned: Oral ETT  Additional Equipment:   Intra-op Plan:   Post-operative Plan: Extubation in OR  Informed Consent: I have reviewed the patients History and Physical, chart, labs and discussed the procedure including the risks, benefits and alternatives for the proposed anesthesia with the patient or authorized representative who has indicated his/her understanding and acceptance.   Dental advisory given  Plan Discussed with: Anesthesiologist, Surgeon and CRNA  Anesthesia Plan Comments:        Anesthesia Quick Evaluation

## 2013-11-16 NOTE — H&P (Signed)
[name], [name] 28 y.o., male [id number]     Chief Complaint: epistaxis  HPI: 28 year old black male who comes in for evaluation of off and on right-sided epistaxis for the past 2 months.  Last evening, he had some bleeding from both sides.  He was seen in the emergency room 2 weeks ago with nasal congestion, ear congestion, chronic tan nasal drainage and given Augmentin for presumed sinus infection.  The drainage is now clear and the facial congestion feels better.  His ears remained stuffy.  He has never used cocaine.  No history of trauma or  surgery to the nose.  No blood thinners.  No known bleeding problems elsewhere.  No past issues with epistaxis.  He has had some degree of nasal congestion/obstruction for months, but cannot be more specific.  Likewise with the stuffiness in his ears.  No neck masses.  No change in weight, appetite, or energy.  No exposure to HIV.  He has not had trouble with large tonsils or sleep apnea.  PMH:No past medical history on file.  Surg Hx:No past surgical history on file.  FHx:  No family history on file. SocHx:  reports that he has been smoking.  He does not have any smokeless tobacco history on file. He reports that he does not drink alcohol or use illicit drugs.  ALLERGIES: No Known Allergies   (Not in a hospital admission)  No results found for this or any previous visit (from the past 48 hour(s)). No results found.  NKN:[id number]: Not feeling tired (fatigue).  No fever, no night sweats, and no recent weight loss. Head: No headache. Eyes: No eye symptoms. Otolaryngeal: No hearing loss, no earache, no tinnitus, and no purulent nasal discharge.  No nasal passage blockage (stuffiness), no snoring, no sneezing, no hoarseness, and no sore throat. Cardiovascular: No chest pain or discomfort  and no palpitations. Pulmonary: No dyspnea, no cough, and no wheezing. Gastrointestinal: No dysphagia  and no heartburn.  No nausea, no abdominal pain, and no  melena.  No diarrhea. Genitourinary: No dysuria. Endocrine: No muscle weakness. Musculoskeletal: No calf muscle cramps, no arthralgias, and no soft tissue swelling. Neurological: No dizziness, no fainting, no tingling, and no numbness. Psychological: No anxiety  and no depression. Skin: No rash.  BP:119/78,  HR: 85 b/min,  Height: 6 ft 2 in, Weight: 189 lb , BMI: 24.3 kg/m2,   PHYSICAL EXAM: He is tall, thin, and basically no distress.  He has a paper towel in his RIGHT nose and is not actively bleeding.  Mental status is sharp.  He hears adequately and conversational speech.  Voice is hyponasal and respirations unlabored through the mouth.  The head is atraumatic and neck supple.  Cranial nerves intact.  He has a serous effusion in each ear.  On the RIGHT side, it is blood-tinged.  The anterior nose is moist and slightly narrow on the LEFT side.  On the RIGHT side, there are soft clots.  Upon evacuating degrees, there is mild bloody oozing from the posterior nose.  Oral cavity is clear with teeth in fair to good repair.  Oropharynx shows a somewhat irregular appearing uvula.  RIGHT tonsil is 1+, LEFT tonsil is 2+.  Some old blood.  Neck with bilateral shotty adenopathy.   Using the nasal endoscope, he has bulky tissue,?  Adenoids, in the LEFT nasopharynx.  This is more prominent on the RIGHT side and is oozing from the tissue mass.  The nose itself shows a leftward septal  deviation but no distinct pathology on either side.  Lungs:  Clear to auscultation Heart:  RRR, no murmurs Abd:  Scaphoid.  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  Jodi Marble 9/83/3825, 12:57 PM

## 2013-11-16 NOTE — Op Note (Signed)
11/16/2013  7:53 PM    [name]  [id number]   Pre-Op Dx:  Epistaxis. Uncertain behaving nasopharyngeal mass. Chronic serous otitis media bilateral  Post-op Dx: Same  Proc: Nasal endoscopy with biopsy. Adenoidectomy with control of hemorrhage. Bilateral myringotomy with T-tube placement   Surg:  Jodi Marble T MD  Anes:  GOT  EBL:  1200 mL  Comp:  None  Findings:  A large friable bleeding obstructive nasopharyngeal mass including extension into the choana on both sides, left more than right. Frozen section suggested atypical adenoid lymphoid tissue. 1+ right tonsil. 2+ left tonsil. Probable right retropharyngeal node. A thick mucoid middle ear effusion, right more than left.  Procedure:  With the patient in a comfortable supine position, general orotracheal anesthesia was induced without difficulty. At an appropriate level, the patient was placed in a slight sitting position. He had received preoperative Afrin spray for nasal decongestion and hemostasis.  A clean preparation and draping was accomplished in the standard fashion.  Using the 0 nasal endoscope, clots were evacuated from both sides of the nose. Under direct vision, using a straight Wilde forceps, several pieces of soft tissue were removed from the posterior right nasal passage in the nasopharynx. These were sent in saline for frozen section and lymphoma workup. Suction cautery was used to attempt hemostasis in the posterior right nose unsuccessfully.  The pathologist felt like additional material would be helpful for completing the evaluation. This is not nasopharyngeal carcinoma but could be lymphoma.  At this point, to obtain additional tissue, and to achieve hemostasis, an adenoidectomy approach was used.  The table was turned 90 and the patient placed in Trendelenburg.  Taking care to protect lips, teeth, and endotracheal tube, the Crowe-Davis mouth gag was placed, expanded for visualization, and suspended  from the [place] stand in the standard fashion. The findings were as described above. There was active bleeding in the pharynx.  A sharp adenoid curette was used to take additional tissue in the midline which was sent for pathologic evaluation. The nasopharynx was packed with moist tonsil sponges for several minutes. The platelet count was noted to be 41,000, possibly contributing to additional bleeding.  A red rubber catheter was passed through the right nose and brought out the mouth as a palate retractor. The nasopharynx was unpacked. Using suction cautery and indirect visualization, attempts were made to ablate additional adenoid tissue and to achieve hemostasis, unsuccessfully. The nasopharynx was packed once again.  A second red rubber catheter was passed through the left nose to improve visualization. Finally, a 2-0 silk stitch was placed to the uvula and brought forward to allow better visualization.  The nasopharynx was unpacked once again. Continuous oozing was noted. Some adenoid tags were removed with curettes and the nasopharynx was packed again.  At this point, more direct visualization was possible, and cautery was performed with successful control of bleeding.  After achieving control, the palate retractor and mouthgag were relaxed for approximately 10 minutes with persistent hemostasis observed.  An orogastric tube was placed and a small amount of blood was evacuated from the stomach. The tube was removed.    Hemostasis was observed in the nasopharynx. The pharynx was irrigated with saline and suctioned clear. This completed the adenoid portion of the procedure.  The patient was placed flat and returned to the direction of anesthesia. The head was rotated to the left for access to the right ear. Microscope was brought into the field with the findings as described above. An  anterior inferior radial myringotomy incision was sharply executed and blood-tinged thick mucoid effusion was  evacuated. A modified T-tube was placed and Ciprodex drops were instilled into the canal. A cotton ball was placed at the external meatus. This completed the right ear.  The left ear was performed in the  same fashion  with a lesser degree of mucoid effusion noted.  At this point the procedure was completed. Patient was returned to anesthesia, awakened, extubated, and transferred to recovery in stable condition.   Dispo:   PACU to step down unit given significant blood loss.  Plan:  Await final pathology. Will recheck a CBC in the morning. If he does not bleed further, he will probably not require a blood transfusion.  Given a platelet count of 41,000, I will consult hematology. I would anticipate possible discharge in the morning.  Tyson Alias MD

## 2013-11-16 NOTE — Transfer of Care (Signed)
Immediate Anesthesia Transfer of Care Note  Patient: [name]  Procedure(s) Performed: Procedure(s): NASAL ENDOSCOPY with nasopharyngeal biopsy with frozen section and adnoidectomy (N/A) ADENOIDECTOMY (N/A) MYRINGOTOMY WITH TUBE PLACEMENT (Bilateral)  Patient Location: PACU  Anesthesia Type:General  Level of Consciousness: awake, alert  and oriented  Airway & Oxygen Therapy: Patient Spontanous Breathing and Patient connected to face mask oxygen  Post-op Assessment: Report given to PACU RN and Post -op Vital signs reviewed and stable  Post vital signs: Reviewed and stable  Complications: No apparent anesthesia complications

## 2013-11-16 NOTE — Discharge Instructions (Signed)
Activity for 5 days Frequent nasal saline spray Okay to rinse throat with salt water as often as desired. Advance diet as comfortable. Keep the ears dry as best possible. A cotton ball with Vaseline to plug the ears with showering is fine Ciprodex drops, 4 drops each ear twice daily for 3 days total Hydrocodone liquid for pain relief Call for excessive pain, active bleeding Recheck my office 2 weeks, [phone]

## 2013-11-16 NOTE — ED Notes (Signed)
Pt reports epistaxis at right nare intermittent for 1 month, denies injury, bleeding minimal at triage.

## 2013-11-16 NOTE — ED Notes (Signed)
MD at BS

## 2013-11-16 NOTE — ED Notes (Signed)
Pt. was sleeping when he was called by EMT , pt. notified nurse first that he did not leave.

## 2013-11-16 NOTE — Discharge Instructions (Signed)
Go directly to [facility] ENT office to see Dr. Simeon Craft.   Nosebleed Nosebleeds can be caused by many conditions including trauma, infections, polyps, foreign bodies, dry mucous membranes or climate, medications and air conditioning. Most nosebleeds occur in the front of the nose. It is because of this location that most nosebleeds can be controlled by pinching the nostrils gently and continuously. Do this for at least 10 to 20 minutes. The reason for this long continuous pressure is that you must hold it long enough for the blood to clot. If during that 10 to 20 minute time period, pressure is released, the process may have to be started again. The nosebleed may stop by itself, quit with pressure, need concentrated heating (cautery) or stop with pressure from packing. HOME CARE INSTRUCTIONS   If your nose was packed, try to maintain the pack inside until your caregiver removes it. If a gauze pack was used and it starts to fall out, gently replace or cut the end off. Do not cut if a balloon catheter was used to pack the nose. Otherwise, do not remove unless instructed.  Avoid blowing your nose for 12 hours after treatment. This could dislodge the pack or clot and start bleeding again.  If the bleeding starts again, sit up and bending forward, gently pinch the front half of your nose continuously for 20 minutes.  If bleeding was caused by dry mucous membranes, cover the inside of your nose every morning with a petroleum or antibiotic ointment. Use your little fingertip as an applicator. Do this as needed during dry weather. This will keep the mucous membranes moist and allow them to heal.  Maintain humidity in your home by using less air conditioning or using a humidifier.  Do not use aspirin or medications which make bleeding more likely. Your caregiver can give you recommendations on this.  Resume normal activities as able but try to avoid straining, lifting or bending at the waist for several  days.  If the nosebleeds become recurrent and the cause is unknown, your caregiver may suggest laboratory tests. SEEK IMMEDIATE MEDICAL CARE IF:   Bleeding recurs and cannot be controlled.  There is unusual bleeding from or bruising on other parts of the body.  You have a fever.  Nosebleeds continue.  There is any worsening of the condition which originally brought you in.  You become lightheaded, feel faint, become sweaty or vomit blood. MAKE SURE YOU:   Understand these instructions.  Will watch your condition.  Will get help right away if you are not doing well or get worse. Document Released: 07/02/2005 Document Revised: 12/15/2011 Document Reviewed: 08/24/2009 [facility] Patient Information 2014 [place], [place].

## 2013-11-16 NOTE — ED Notes (Addendum)
Pt going to ENT appointment at 0900 with Dr. Simeon Craft. Given directions how to get there. Pt a x 4. PT reports his rhino rocket fell out. Has gauze in his nose and given a box of tissues until he can get to app.t

## 2013-11-16 NOTE — ED Provider Notes (Signed)
CSN: [id number]     Arrival date & time 11/16/13  0137 History   First MD Initiated Contact with Patient 11/16/13 0501     Chief Complaint  Patient presents with  . Epistaxis     (Consider location/radiation/quality/duration/timing/severity/associated sxs/prior Treatment) Patient is a 28 y.o. male presenting with nosebleeds. The history is provided by the patient.  Epistaxis He has had nasal congestion with yellowish to brownish rhinorrhea for about the last 3 weeks. He was seen in emergency and given a prescription for amoxicillin-clavulanic acid and also a prescription for ipratropium nose spray. He has taken the medication as prescribed and his continued usebut is not seeing any improvement. He has been having intermittent nosebleeds but tonight the nosebleed became constant. The bleeding is from the right nostril. Tonight it started after he sneezed. He denies nasal trauma and specifically denies picking at it. He denies taking aspirin and is not on any anticoagulants. He has not had problems with nosebleeds before.  History reviewed. No pertinent past medical history. History reviewed. No pertinent past surgical history. No family history on file. History  Substance Use Topics  . Smoking status: Current Every Day Smoker  . Smokeless tobacco: Not on file  . Alcohol Use: No    Review of Systems  HENT: Positive for nosebleeds.   All other systems reviewed and are negative.      Allergies  Review of patient's allergies indicates no known allergies.  Home Medications   Current Outpatient Rx  Name  Route  Sig  Dispense  Refill  . acetaminophen (TYLENOL) 500 MG tablet   Oral   Take 500 mg by mouth every 6 (six) hours as needed for mild pain or headache.          Marland Kitchen amoxicillin-clavulanate (AUGMENTIN) 875-125 MG per tablet   Oral   Take 1 tablet by mouth 2 (two) times daily.   13 tablet   0   . ibuprofen (ADVIL,MOTRIN) 200 MG tablet   Oral   Take 400 mg by mouth every  6 (six) hours as needed for fever, headache or moderate pain.          BP 134/84  Pulse 86  Temp(Src) 97.8 F (36.6 C) (Oral)  Resp 18  Ht 6\' 2"  (1.88 m)  Wt 185 lb (83.915 kg)  BMI 23.74 kg/m2  SpO2 99% Physical Exam  Nursing note and vitals reviewed.  28 year old male, resting comfortably and in no acute distress. Vital signs are normal. Oxygen saturation is 99%, which is normal. Head is normocephalic and atraumatic. PERRLA, EOMI. Oropharynx is clear. There is slow bleeding from the right nostril. Nasal turbinates are very edematous and bleeding site seems to be coming from the region of the turbinates. I do not see any bleeding sites from the nasal septum. There is no sinus tenderness. Neck is nontender and supple without adenopathy or JVD. Back is nontender and there is no CVA tenderness. Lungs are clear without rales, wheezes, or rhonchi. Chest is nontender. Heart has regular rate and rhythm without murmur. Abdomen is soft, flat, nontender without masses or hepatosplenomegaly and peristalsis is normoactive. Extremities have no cyanosis or edema, full range of motion is present. Skin is warm and dry without rash. Neurologic: Mental status is normal, cranial nerves are intact, there are no motor or sensory deficits.  ED Course  EPISTAXIS MANAGEMENT Date/Time: 11/16/2013 5:00 AM Performed by: Delora Fuel Authorized by: Roxanne Mins, Shivali Quackenbush Consent: Verbal consent obtained. written consent not obtained. Risks  and benefits: risks, benefits and alternatives were discussed Consent given by: patient Patient understanding: patient states understanding of the procedure being performed Patient consent: the patient's understanding of the procedure matches consent given Procedure consent: procedure consent matches procedure scheduled Relevant documents: relevant documents present and verified Site marked: the operative site was marked Required items: required blood products, implants,  devices, and special equipment available Patient identity confirmed: verbally with patient and arm band Time out: Immediately prior to procedure a "time out" was called to verify the correct patient, procedure, equipment, support staff and site/side marked as required. Patient sedated: no Treatment site: right posterior Repair method: Rapid Rhino 7.5 cm. Post-procedure assessment: bleeding decreased Treatment complexity: simple Patient tolerance: Patient tolerated the procedure well with no immediate complications. Comments: Because of anterior bleeding site could not be identified, 7.5 cm Rapid Rhino was used. Following this, he had a small amount of blood coming from the left nostril to when this was cleared there was no further bleeding from the left nostril. However, some very blood was seen dripping into the oropharynx. He was observed further in the ED with continued persistent dripping of blood into the oropharynx. At this point, it was decided to refer the patient for ENT evaluation. I've contacted Dr. Simeon Craft of the ENT service who requests the patient be sent to his office at 9 AM.   (including critical care time)  MDM   Final diagnoses:  Epistaxis    Epistaxis in the setting of chronically inflamed turbinates. He has failed treatment for sinusitis. Because I cannot clearly see the source of the bleeding, he is treated with a Rapid Rhino 7.5 cm nasal pack in his observed in the ED. He will be referred to ENT for followup. Old records were reviewed and confirm that he was treated for probable sinusitis with amoxicillin-clavulanic acid and ipratropium nasal spray.  Bleeding persisted as noted in procedure note, so he is sent to ENT office for further evaluation.  Delora Fuel, MD 80/32/12 [id number]

## 2013-11-16 NOTE — Anesthesia Postprocedure Evaluation (Signed)
  Anesthesia Post-op Note  Patient: [name]  Procedure(s) Performed: Procedure(s): NASAL ENDOSCOPY with nasopharyngeal biopsy with frozen section and adnoidectomy (N/A) ADENOIDECTOMY (N/A) MYRINGOTOMY WITH TUBE PLACEMENT (Bilateral)  Patient Location: PACU  Anesthesia Type:General  Level of Consciousness: awake and sedated  Airway and Oxygen Therapy: Patient Spontanous Breathing  Post-op Pain: mild  Post-op Assessment: Post-op Vital signs reviewed  Post-op Vital Signs: stable  Complications: No apparent anesthesia complications

## 2013-11-16 NOTE — ED Notes (Signed)
Pt checked in during system downtime.

## 2013-11-16 NOTE — ED Notes (Signed)
Dr. Roxanne Mins reporting pt has appointment at ENT today 0900. Will discharge at prior, then he can go to his appointment. Pt made aware.

## 2013-11-16 NOTE — ED Notes (Signed)
ENT cart at BS.  

## 2013-11-17 ENCOUNTER — Observation Stay (HOSPITAL_COMMUNITY): Payer: Self-pay

## 2013-11-17 ENCOUNTER — Encounter (HOSPITAL_COMMUNITY): Payer: Self-pay | Admitting: Otolaryngology

## 2013-11-17 DIAGNOSIS — R040 Epistaxis: Secondary | ICD-10-CM | POA: Diagnosis present

## 2013-11-17 DIAGNOSIS — D696 Thrombocytopenia, unspecified: Secondary | ICD-10-CM | POA: Diagnosis present

## 2013-11-17 DIAGNOSIS — Z21 Asymptomatic human immunodeficiency virus [HIV] infection status: Secondary | ICD-10-CM

## 2013-11-17 DIAGNOSIS — R7401 Elevation of levels of liver transaminase levels: Secondary | ICD-10-CM | POA: Diagnosis present

## 2013-11-17 DIAGNOSIS — B20 Human immunodeficiency virus [HIV] disease: Secondary | ICD-10-CM | POA: Diagnosis present

## 2013-11-17 DIAGNOSIS — K759 Inflammatory liver disease, unspecified: Secondary | ICD-10-CM

## 2013-11-17 DIAGNOSIS — R740 Nonspecific elevation of levels of transaminase and lactic acid dehydrogenase [LDH]: Secondary | ICD-10-CM

## 2013-11-17 DIAGNOSIS — H6690 Otitis media, unspecified, unspecified ear: Secondary | ICD-10-CM | POA: Diagnosis present

## 2013-11-17 DIAGNOSIS — R7402 Elevation of levels of lactic acid dehydrogenase (LDH): Secondary | ICD-10-CM

## 2013-11-17 DIAGNOSIS — R748 Abnormal levels of other serum enzymes: Secondary | ICD-10-CM | POA: Diagnosis present

## 2013-11-17 DIAGNOSIS — D649 Anemia, unspecified: Secondary | ICD-10-CM | POA: Clinically undetermined

## 2013-11-17 LAB — LACTATE DEHYDROGENASE: LDH: 429 U/L — ABNORMAL HIGH (ref 94–250)

## 2013-11-17 LAB — HEPATITIS B SURFACE ANTIGEN: HEP B S AG: NEGATIVE

## 2013-11-17 LAB — CBC WITH DIFFERENTIAL/PLATELET
Basophils Absolute: 0 10*3/uL (ref 0.0–0.1)
Basophils Relative: 0 % (ref 0–1)
EOS ABS: 0 10*3/uL (ref 0.0–0.7)
Eosinophils Relative: 0 % (ref 0–5)
HEMATOCRIT: 29.6 % — AB (ref 39.0–52.0)
HEMOGLOBIN: 9.9 g/dL — AB (ref 13.0–17.0)
Lymphocytes Relative: 21 % (ref 12–46)
Lymphs Abs: 1.1 10*3/uL (ref 0.7–4.0)
MCH: 27.4 pg (ref 26.0–34.0)
MCHC: 33.4 g/dL (ref 30.0–36.0)
MCV: 82 fL (ref 78.0–100.0)
MONO ABS: 0.5 10*3/uL (ref 0.1–1.0)
MONOS PCT: 10 % (ref 3–12)
Neutro Abs: 3.7 10*3/uL (ref 1.7–7.7)
Neutrophils Relative %: 69 % (ref 43–77)
Platelets: 40 10*3/uL — ABNORMAL LOW (ref 150–400)
RBC: 3.61 MIL/uL — ABNORMAL LOW (ref 4.22–5.81)
RDW: 14.2 % (ref 11.5–15.5)
WBC: 5.4 10*3/uL (ref 4.0–10.5)

## 2013-11-17 LAB — HEPATITIS PANEL, ACUTE
HCV Ab: REACTIVE — AB
HEP B C IGM: NONREACTIVE
Hep A IgM: NONREACTIVE
Hepatitis B Surface Ag: NEGATIVE

## 2013-11-17 LAB — COMPREHENSIVE METABOLIC PANEL
ALK PHOS: 90 U/L (ref 39–117)
ALT: 64 U/L — AB (ref 0–53)
AST: 136 U/L — ABNORMAL HIGH (ref 0–37)
Albumin: 2.7 g/dL — ABNORMAL LOW (ref 3.5–5.2)
BUN: 19 mg/dL (ref 6–23)
CHLORIDE: 102 meq/L (ref 96–112)
CO2: 26 mEq/L (ref 19–32)
Calcium: 8.5 mg/dL (ref 8.4–10.5)
Creatinine, Ser: 0.94 mg/dL (ref 0.50–1.35)
GFR calc Af Amer: >90 mL/min (ref >90)
Glucose, Bld: 102 mg/dL — ABNORMAL HIGH (ref 70–99)
Potassium: 4.4 mEq/L (ref 3.7–5.3)
Sodium: 138 mEq/L (ref 137–147)
Total Bilirubin: 0.7 mg/dL (ref 0.3–1.2)
Total Protein: 7.4 g/dL (ref 6.0–8.3)

## 2013-11-17 LAB — HEPATITIS C ANTIBODY: HCV Ab: REACTIVE — AB

## 2013-11-17 LAB — HEPATITIS B SURFACE ANTIBODY,QUALITATIVE: Hep B S Ab: POSITIVE — AB

## 2013-11-17 LAB — RETICULOCYTES
RBC.: 3.28 MIL/uL — ABNORMAL LOW (ref 4.22–5.81)
RETIC COUNT ABSOLUTE: 52.5 10*3/uL (ref 19.0–186.0)
Retic Ct Pct: 1.6 % (ref 0.4–3.1)

## 2013-11-17 LAB — HEPATITIS A ANTIBODY, TOTAL: Hep A Total Ab: REACTIVE — AB

## 2013-11-17 LAB — RPR: RPR Ser Ql: NONREACTIVE

## 2013-11-17 MED ORDER — FOLIC ACID 1 MG PO TABS
2.0000 mg | ORAL_TABLET | Freq: Every day | ORAL | Status: DC
  Administered 2013-11-17 – 2013-11-18 (×2): 2 mg via ORAL
  Filled 2013-11-17 (×2): qty 2

## 2013-11-17 MED ORDER — INFLUENZA VAC SPLIT QUAD 0.5 ML IM SUSP
0.5000 mL | INTRAMUSCULAR | Status: AC
  Administered 2013-11-18: 0.5 mL via INTRAMUSCULAR
  Filled 2013-11-17: qty 0.5

## 2013-11-17 MED ORDER — IOHEXOL 300 MG/ML  SOLN
100.0000 mL | Freq: Once | INTRAMUSCULAR | Status: AC | PRN
  Administered 2013-11-17: 100 mL via INTRAVENOUS

## 2013-11-17 MED ORDER — PNEUMOCOCCAL VAC POLYVALENT 25 MCG/0.5ML IJ INJ
0.5000 mL | INJECTION | INTRAMUSCULAR | Status: AC
  Administered 2013-11-18: 0.5 mL via INTRAMUSCULAR
  Filled 2013-11-17: qty 0.5

## 2013-11-17 MED ORDER — FERROUS GLUCONATE 324 (38 FE) MG PO TABS
324.0000 mg | ORAL_TABLET | Freq: Two times a day (BID) | ORAL | Status: DC
  Administered 2013-11-17 – 2013-11-18 (×3): 324 mg via ORAL
  Filled 2013-11-17 (×5): qty 1

## 2013-11-17 NOTE — Progress Notes (Signed)
11/17/2013 10:56 AM  [name], [name] [id number]  Post-Op Day 1    Temp:  [97.7 F (36.5 C)-100.9 F (38.3 C)] 97.7 F (36.5 C) (02/12 0741) Pulse Rate:  [75-115] 97 (02/12 0327) Resp:  [13-24] 21 (02/12 0327) BP: (118-153)/(54-91) 124/62 mmHg (02/12 0327) SpO2:  [89 %-99 %] 96 % (02/12 0327) Weight:  [85.6 kg (188 lb 11.4 oz)] 85.6 kg (188 lb 11.4 oz) (02/11 2223),     Intake/Output Summary (Last 24 hours) at 11/17/13 1056 Last data filed at 11/17/13 1000  Gross per 24 hour  Intake   5397 ml  Output   3450 ml  Net   1947 ml    Results for orders placed during the hospital encounter of 11/16/13 (from the past 24 hour(s))  APTT     Status: Abnormal   Collection Time    11/16/13  2:22 PM      Result Value Ref Range   aPTT 39 (*) 24 - 37 seconds  CBC WITH DIFFERENTIAL     Status: Abnormal   Collection Time    11/16/13  2:22 PM      Result Value Ref Range   WBC 5.0  4.0 - 10.5 K/uL   RBC 4.38  4.22 - 5.81 MIL/uL   Hemoglobin 12.2 (*) 13.0 - 17.0 g/dL   HCT 36.0 (*) 39.0 - 52.0 %   MCV 82.2  78.0 - 100.0 fL   MCH 27.9  26.0 - 34.0 pg   MCHC 33.9  30.0 - 36.0 g/dL   RDW 14.3  11.5 - 15.5 %   Platelets 41 (*) 150 - 400 K/uL   Neutrophils Relative % 46  43 - 77 %   Neutro Abs 2.3  1.7 - 7.7 K/uL   Lymphocytes Relative 37  12 - 46 %   Lymphs Abs 1.9  0.7 - 4.0 K/uL   Monocytes Relative 15 (*) 3 - 12 %   Monocytes Absolute 0.8  0.1 - 1.0 K/uL   Eosinophils Relative 1  0 - 5 %   Eosinophils Absolute 0.1  0.0 - 0.7 K/uL   Basophils Relative 1  0 - 1 %   Basophils Absolute 0.0  0.0 - 0.1 K/uL  PROTIME-INR     Status: None   Collection Time    11/16/13  2:22 PM      Result Value Ref Range   Prothrombin Time 13.9  11.6 - 15.2 seconds   INR 1.09  0.00 - 1.49  HIV ANTIBODY (ROUTINE TESTING)     Status: Abnormal   Collection Time    11/16/13  2:22 PM      Result Value Ref Range   HIV Reactive (*) NON REACTIVE  POCT I-STAT 4, (NA,K, GLUC, HGB,HCT)     Status: Abnormal   Collection Time    11/16/13  6:40 PM      Result Value Ref Range   Sodium 139  137 - 147 mEq/L   Potassium 3.9  3.7 - 5.3 mEq/L   Glucose, Bld 103 (*) 70 - 99 mg/dL   HCT 30.0 (*) 39.0 - 52.0 %   Hemoglobin 10.2 (*) 13.0 - 17.0 g/dL  COMPREHENSIVE METABOLIC PANEL     Status: Abnormal   Collection Time    11/16/13 11:06 PM      Result Value Ref Range   Sodium 138  137 - 147 mEq/L   Potassium 4.4  3.7 - 5.3 mEq/L   Chloride 102  96 - 112 mEq/L  CO2 26  19 - 32 mEq/L   Glucose, Bld 102 (*) 70 - 99 mg/dL   BUN 19  6 - 23 mg/dL   Creatinine, Ser 0.94  0.50 - 1.35 mg/dL   Calcium 8.5  8.4 - 10.5 mg/dL   Total Protein 7.4  6.0 - 8.3 g/dL   Albumin 2.7 (*) 3.5 - 5.2 g/dL   AST 136 (*) 0 - 37 U/L   ALT 64 (*) 0 - 53 U/L   Alkaline Phosphatase 90  39 - 117 U/L   Total Bilirubin 0.7  0.3 - 1.2 mg/dL   GFR calc non Af Amer >90  >90 mL/min   GFR calc Af Amer >90  >90 mL/min  LACTATE DEHYDROGENASE     Status: Abnormal   Collection Time    11/16/13 11:06 PM      Result Value Ref Range   LDH 429 (*) 94 - 250 U/L  CBC WITH DIFFERENTIAL     Status: Abnormal   Collection Time    11/16/13 11:06 PM      Result Value Ref Range   WBC 5.4  4.0 - 10.5 K/uL   RBC 3.61 (*) 4.22 - 5.81 MIL/uL   Hemoglobin 9.9 (*) 13.0 - 17.0 g/dL   HCT 29.6 (*) 39.0 - 52.0 %   MCV 82.0  78.0 - 100.0 fL   MCH 27.4  26.0 - 34.0 pg   MCHC 33.4  30.0 - 36.0 g/dL   RDW 14.2  11.5 - 15.5 %   Platelets 40 (*) 150 - 400 K/uL   Neutrophils Relative % 69  43 - 77 %   Neutro Abs 3.7  1.7 - 7.7 K/uL   Lymphocytes Relative 21  12 - 46 %   Lymphs Abs 1.1  0.7 - 4.0 K/uL   Monocytes Relative 10  3 - 12 %   Monocytes Absolute 0.5  0.1 - 1.0 K/uL   Eosinophils Relative 0  0 - 5 %   Eosinophils Absolute 0.0  0.0 - 0.7 K/uL   Basophils Relative 0  0 - 1 %   Basophils Absolute 0.0  0.0 - 0.1 K/uL  RETICULOCYTES     Status: Abnormal   Collection Time    11/17/13  8:30 AM      Result Value Ref Range   Retic Ct Pct 1.6   0.4 - 3.1 %   RBC. 3.28 (*) 4.22 - 5.81 MIL/uL   Retic Count, Manual 52.5  19.0 - 186.0 K/uL    SUBJECTIVE:  Hearing better.  No bleeding.  Breathing OK.  Mod pain, controlled with meds  OBJECTIVE:  Soiled drip pad in place.  Voice clear, breathing well.  IMPRESSION:  New HIV + diagnosis, discussed with patient.  PLAN:  Await ID consultation, further w/u per ID and Hematology.  Recommended to transfer to Hospitalist IP service.  Recheck CBC.  Out of step down unit.  Jodi Marble

## 2013-11-17 NOTE — Consult Note (Signed)
[name], [name] NO.:  [id number]  MEDICAL RECORD NO.:  [id number]  LOCATION:  6N31C                        FACILITY:  [facility]  PHYSICIAN:  Volanda Napoleon, M.D.  DATE OF BIRTH:  [birth year]  DATE OF CONSULTATION:  11/16/2013 DATE OF DISCHARGE:                                CONSULTATION   REASON FOR CONSULTATION: 1. Thrombocytopenia. 2. Nasopharyngeal mass.  HISTORY OF PRESENT ILLNESS:  [name] is a very nice 28 year old African American gentleman.  He has been in good health.  He currently works at [employer].  He has no obvious past history from the records.  He was seen by Dr. Erik Obey after having epistaxis for a couple of months.  He began to have a lot of bleeding the day before admission. He had been in the emergency room.  He was not on any blood thinners. He was not taking aspirin or nonsteroidals.  There is no past history of bleeding.  There is no family history of bleeding.  He does have tattoos.  He does smoke, but really does not drink alcohol or use recreational drugs.  Dr. Erik Obey did an exam on him.  He did have bulky tissue with the adenoids.  He then underwent a CT of the face.  This was done on the 11th.  The CT scan showed markedly enlarged nasopharyngeal adenoids.  It was felt that this could represent lymphoma, nasopharyngeal carcinoma, or possibly hyperplasia.  He had blood work done on the 11th.  He was found have a white cell count of 5, hemoglobin 12.2, and hematocrit of 36.  Platelet count however was 41,000.  He had an INR of 1, and PTT of 39 seconds.  His white cell differential showed 46 segs, 37 lymphs, 15 monos.  Renal chemistry studies done were sodium and potassium.  He did have an HIV test done.  Unfortunately, this was found to be reactive.  [name] was not aware of any possibility of him being HIV positive.  I spoke to his parents.  I explained to them what we have found.  His mom confirms that he  is homosexual.  We again are asked to see him so that we could help manage his thrombocytopenia and bleeding.  PAST MEDICAL HISTORY:  Pretty much unremarkable.  ALLERGIES:  None.  ADMISSION MEDICATIONS:  Some nasal spray.  SOCIAL HISTORY:  Negative for recreational drug use.  He does have some tobacco use.  There maybe some alcohol use.  FAMILY HISTORY:  Negative.  According to his parents, there is no history of bleeding in the family.  There is no history of sickle cell in the family.  PHYSICAL EXAMINATION:  GENERAL:  This is a well-developed, well- nourished African American gentleman in no obvious distress.  He was somewhat groggy from his anesthesia. VITAL SIGNS:  Temperature 97.8, pulse 95, respiratory rate 17, blood pressure 136/74. HEAD AND NECK:  Nasal packing.  He has no obvious adenopathy in his neck.  Thyroid is not palpable. LUNGS:  Clear bilaterally. CARDIAC:  Regular rate and rhythm with a normal S1 and S2.  There are no murmurs, rubs, or bruits. ABDOMEN:  Soft with good  bowel sounds.  He has no palpable hepatosplenomegaly. AXILLARY:  Shows bilateral axillary lymphadenopathy.  Lymph nodes probably measure about 1 cm.  They are firm and mobile and nontender. INGUINAL:  Some small inguinal lymphadenopathy. EXTREMITIES:  No clubbing, cyanosis, or edema. SKIN:  No rashes. NEUROLOGICAL:  No focal neurological deficits.  IMPRESSION AND PLAN:  [name] is a nice 28 year old gentleman.  He has thrombocytopenia.  I did look at the blood smear.  I did not see schistocytes.  Platelets were decreased in number.  They were well granulated.  Platelets were somewhat large.  He had no nucleated red cells.  There was no rouleaux formation.  He had no target cells.  White cells appeared normal in morphology and maturation.  [name] is a 28 year old gentleman.  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  Again, this was just the initial test.  We will have  to run additional tests.  However, I think the whole clinical scenario is consistent with HIV-associated thrombocytopenia.  I hope that what is in the nasopharynx is just adenoid hyperplasia.  He does not have lymphadenopathy elsewhere.  I will go ahead and give him a dose of IVIG.  I think this would be reasonable given that he has bleeding and has a high likelihood of thrombocytopenias from HIV.  It is also recommended to do IVIG.  I will also send off hepatitis serologies to make sure that there is no hepatitis B or hepatitis C.  It is certainly possible if he is truly HIV positive that he has got this from tattoos.  We will have to have Infectious Disease look into his risk factors.  Again, I spoke to his parents.  I told him what I thought was going on. I told him that we needed to do confirmatory testing and that Infectious Disease doctors would be involved.  I think [name] may need a CT scan so that we can check to see if there is lymphadenopathy elsewhere.  I feel very confident that the thrombocytopenia will be resolved.  I think that treatment with HAART agents will improve his platelets ultimately and also improve his lymphadenopathy.  I would not think that he has lymphoma.  However, this certainly is a possibility.  We will follow him along.  Again, his parents are very, very nice.  They certainly do understand what is the likely problem right now.     Volanda Napoleon, M.D.     PRE/MEDQ  D:  11/16/2013  T:  11/17/2013  Job:  [id number]

## 2013-11-17 NOTE — Progress Notes (Addendum)
1509: Attempted to call report to 6N RN, in room with patient. Gave number, RN will call back.  1520: Report given. Pt has visitors in room, leaving in 20 mins. Will transfer after. VSS. All belongings sent with patient including cell phone and charger. eICU and CCMT notified of transfer.  1545: Transferred by wheelchair escorted by RN.

## 2013-11-17 NOTE — Progress Notes (Signed)
Pt belongings from PACU (clothing, keys, cell phone charger) given to patient. Cell phone retrieved from security, given to patient.

## 2013-11-17 NOTE — Progress Notes (Signed)
UR completed. Patient changed to inpatient- requiring IVF @ 150cc/hr, IVIG, and IV solumedrol.

## 2013-11-17 NOTE — Progress Notes (Signed)
Had no problems with the first dose of IVIG. He did have a little bit of a temperature with the IVIG. I would not expect any problems with his second dose.  His platelet count is 40,000. Hemoglobin is 9.9. It does not look like he is having much, if any bleeding from the nares.  His LDH is high. Again this is not surprising.  I think what is a little bit concerning is his albumin is still low. This is highly unusual for somebody young and healthy. His liver functions are little bit on the high side. I will be very interested see what his hepatitis studies show.  I look as blood smear again. I do not see anything that looked suspicious for microangiopathic hemolysis (i.e. TTP).  He's had no cough. He's had no diarrhea.  I talked to him again about the likely HIV diagnosis. I reassured him that I do not think that he had AIDS. However, we will need to see what his other studies show. He'll be very interesting to see what his CAT scan shows.  His vital signs are stable. Again have a little bit of a temperature which I suspect is probably from the IVIG. I don't find anything new on his physical exam.  The workup is in progress for the HIV positive test. The confirmatory test and quantitative studies for HIV are pending. Hepatitis serologies are pending his CAT scan is pending. We'll be very interested see what infectious disease has to say about all this.  I don't think that he needs a bone marrow test at the present time.  Frederich Cha 1:5-7

## 2013-11-17 NOTE — Progress Notes (Signed)
Patient ID: [name], male   DOB: 1986/09/24, 28 y.o.   MRN: [id number]         [facility]    Date of Admission:  11/16/2013          Reason for Consult: Reactive HIV screening test    Referring Physician: Dr. Herbie Baltimore  Principal Problem:   Nasopharyngeal mass Active Problems:   Thrombocytopenia, unspecified   Human immunodeficiency virus (HIV) disease   Epistaxis   Normocytic anemia   Elevated liver enzymes   Chronic otitis media   . acetaminophen  500 mg Oral Q24 Hr x 2  . amoxicillin  500 mg Oral 3 times per day  . ciprofloxacin-dexamethasone  4 drop Both Ears BID  . famotidine (PEPCID) IV  40 mg Intravenous Q24H  . ferrous gluconate  324 mg Oral BID WC  . folic acid  2 mg Oral Daily  . IMMUNE GLOBULIN 10% (HUMAN) IV - For Fluid Restriction Only  80 g Intravenous Q24H  . [START ON 11/18/2013] influenza vac split quadrivalent PF  0.5 mL Intramuscular Tomorrow-1000  . methylPREDNISolone (SOLU-MEDROL) injection  80 mg Intravenous Q24 Hr x 2  . [START ON 11/18/2013] pneumococcal 23 valent vaccine  0.5 mL Intramuscular Tomorrow-1000    Recommendations: 1. Check HIV viral load. I called [facility] and they will put his specimen on tomorrow's run 2. Check CD4 count, hepatitis A, B and C serologies, RPR, GC screen, chlamydia screen and interferon gamma release assay for tuberculosis   Assessment: Although his screening HIV EIA needs to be confirmed by Western blot and viral load, the likelihood of HIV infection was very high. I requested that his HIV viral load be run tomorrow. I will follow up tomorrow afternoon.   HPI: [name] is a 28 y.o. male who has been in good health until he began to develop intermittent epistaxis several months ago. He underwent CT scan A. which showed a nasopharyngeal mass. He was admitted yesterday and underwent surgery revealing benign lymphoid tissue. It is also noted to be anemic and thrombocytopenic.  His HIV EIA screening test was reactive. He states that he is never been tested for HIV before that he is aware of. He used to donate blood living in [place] but was never told that he had any abnormal test results. Her last donated blood 3 years ago. He states that he has gained but that he and his partners have always used protection. He has never had any other sexually transmitted diseases or hepatitis he knows of. He does smoke cigarettes. He does not drink alcohol to excess she denies any recreational drug use. He works as a Programme researcher, broadcasting/film/video at [employer].   Review of Systems: Constitutional: negative Eyes: negative Ears, nose, mouth, throat, and face: positive for epistaxis and chronic, bilateral fullness in his ears with occasional drainage, negative for earaches, sore mouth and sore throat Respiratory: negative Cardiovascular: negative Gastrointestinal: negative Genitourinary:negative Integument/breast: negative  History reviewed. No pertinent past medical history.  History  Substance Use Topics  . Smoking status: Current Every Day Smoker -- 0.25 packs/day  . Smokeless tobacco: Not on file  . Alcohol Use: No    History reviewed. No pertinent family history. No Known Allergies  OBJECTIVE: Blood pressure 124/62, pulse 97, temperature 97.7 F (36.5 C), temperature source Axillary, resp. rate 21, height 6\' 2"  (1.88 m), weight 85.6 kg (188 lb 11.4 oz), SpO2 96.00%. General: He is alert in no distress. He has nasal  packing in place Skin: No rash Lymph nodes: Shotty epitrochlear adenopathy Oral: No oropharyngeal lesions noted Lungs: Clear Cor: Regular S1 and S2 with no murmurs Abdomen: Soft and nontender Joints and extremities: Normal  Lab Results Lab Results  Component Value Date   WBC 5.4 11/16/2013   HGB 9.9* 11/16/2013   HCT 29.6* 11/16/2013   MCV 82.0 11/16/2013   PLT 40* 11/16/2013    Lab Results  Component Value Date   CREATININE 0.94 11/16/2013   BUN 19 11/16/2013   NA  138 11/16/2013   K 4.4 11/16/2013   CL 102 11/16/2013   CO2 26 11/16/2013    Lab Results  Component Value Date   ALT 64* 11/16/2013   AST 136* 11/16/2013   ALKPHOS 90 11/16/2013   BILITOT 0.7 11/16/2013    HIV EIA: reactive  Microbiology: No results found for this or any previous visit (from the past 240 hour(s)).  [name], MD [facility] [phone] pager   [phone] cell 11/17/2013, 11:54 AM

## 2013-11-17 NOTE — Progress Notes (Signed)
    [facility] for Infectious Disease   Patient tested positve by EIA, WB takes several days in our current system. I am ensuring that HIV ultra quant reflex to genotype and CD4 being done with hep panel.   I will let Dr. Megan Salon know about the patient

## 2013-11-17 NOTE — Progress Notes (Signed)
Physician notified: Wolicki  At: 9150  Regarding: OK to increase diet? No nausea w clear liquids.   Awaiting return response.   Returned Response at: 1420  Order(s): OK to advance.

## 2013-11-17 NOTE — Consult Note (Signed)
[facility] Medical Consultation/transfer of care to hospitalist team  [name] ZOX:[id number] DOB: [birth year] DOA: 11/16/2013 PCP: Default, Provider, MD   Requesting physician: Dr. Erik Obey Date of consultation: 11/17/2013 Reason for consultation: Transfer of care to medical service.  Impression/Recommendations Principal Problem:   Nasopharyngeal mass Active Problems:   Thrombocytopenia, unspecified   Human immunodeficiency virus (HIV) disease   Epistaxis   Normocytic anemia   Elevated liver enzymes   Chronic otitis media   Transaminitis  #1 nasopharyngeal mass  status post nasal endoscopy with biopsy, adenectomy with control of hemorrhage, bilateral myringotomy with T-tube placement 81/19/1478 per Dr. Erik Obey. Biopsies pending. The ENT.  #2  probable HIV infection HIV EIA was positive. Confirmatory testing with Western blot and viral load pending. CD4 count is pending. Acute hepatitis panel is pending. RPR, GC screen, chlamydia screen and interfering, release assay for tuberculosis has been ordered per infectious diseases. Per ID.  #3 thrombocytopenia Patient was noted to have a thrombocytopenia. Patient has been seen by hematology who is evaluating etiology. LDH is elevated. Patient has received some IVIG. Per hematology.  #4 anemia No overt GI bleed. Check an anemia panel. Follow H&H. Transfusion threshold hemoglobin less than 7.  #5 epistaxis Likely secondary to problem #1. Improved. Per ENT.  #6 transaminitis Questionable etiology. Acute hepatitis panel pending. CT of the abdomen and pelvis with no focal hepatic lesion.   #7 prophylaxis SCDs for DVT prophylaxis.    I will followup again tomorrow. Please contact me if I can be of assistance in the meanwhile. Thank you for this consultation.  Chief Complaint: Epistaxis  HPI:  Patient is a 28 year old otherwise healthy gentleman who developed right-sided epistaxis which has been intermittent over the past  2 months prior to admission. The day prior to admission patient had some epistaxis from both nares. Patient had been seen in the emergency room 2 weeks prior to admission for nasal congestion ear congestion nasal drainage and treated with Augmentin for sinus infection. Patient had presented to Dr. Noreene Filbert office, ENT for his evaluation of his epistaxis. CT scan which was done showed a nasopharyngeal mass and patient was subsequently admitted yesterday to the ENT service where he underwent nasal endoscopy with biopsy, adenoidectomy with control of hemorrhage, bilateral myringotomy with T-tube placement. Patient denied any fevers, no chills, no nausea, no vomiting, no chest pain, no abdominal pain, no diarrhea, no constipation, no dysuria, no melena, no hematemesis, no hematochezia, no weakness. Patient does endorse some shortness of breath with his nasal packing. During patient's workup he was noted to be anemic, thrombocytopenic and HIV EIA screening test came back positive. ID consultation was obtained for further evaluation of his HIV. Hematology consultation was also obtained for his thrombocytopenia. Patient's epistaxis has currently improved and has no further bleeding. We were asked to take over patient's care.  Review of Systems:  As per HPI otherwise negative.  History reviewed. No pertinent past medical history. Past Surgical History  Procedure Laterality Date  . Nasal endoscopy with epistaxis control N/A 11/16/2013    Procedure: NASAL ENDOSCOPY with nasopharyngeal biopsy with frozen section and adnoidectomy;  Surgeon: Jodi Marble, MD;  Location: [place];  Service: ENT;  Laterality: N/A;  . Adenoidectomy N/A 11/16/2013    Procedure: ADENOIDECTOMY;  Surgeon: Jodi Marble, MD;  Location: [place];  Service: ENT;  Laterality: N/A;  . Myringotomy with tube placement Bilateral 11/16/2013    Procedure: MYRINGOTOMY WITH TUBE PLACEMENT;  Surgeon: Jodi Marble, MD;  Location: [place];  Service:  ENT;   Laterality: Bilateral;   Social History:  reports that he has been smoking.  He does not have any smokeless tobacco history on file. He reports that he does not drink alcohol or use illicit drugs.  No Known Allergies History reviewed. No pertinent family history.  Prior to Admission medications   Medication Sig Start Date End Date Taking? Authorizing Provider  ipratropium (ATROVENT) 0.03 % nasal spray Place 2 sprays into both nostrils every 6 (six) hours as needed for rhinitis.   Yes Historical Provider, MD  acetaminophen (TYLENOL) 500 MG tablet Take 500 mg by mouth every 6 (six) hours as needed for mild pain or headache.     Historical Provider, MD   Physical Exam: Blood pressure 124/62, pulse 97, temperature 97.7 F (36.5 C), temperature source Axillary, resp. rate 21, height 6\' 2"  (1.88 m), weight 85.6 kg (188 lb 11.4 oz), SpO2 96.00%. Filed Vitals:   11/17/13 0741  BP:   Pulse:   Temp: 97.7 F (36.5 C)  Resp:      General:  NAD  Eyes: PERRLA, EOMI  ENT: Nasal packing in place. Oropharynx is clear with no lesions and no exudates.  Neck: Supple. Shotty epitrochlear adenopathy  Cardiovascular: Regular rate rhythm no murmurs rubs or gallops  Respiratory: Clear" bilaterally. No wheezes, no crackles, no rhonchi.  Abdomen: Soft, nontender, nondistended, positive bowel sounds  Skin: No rashes or lesions  Musculoskeletal: 5 out of 5 bilateral upper extremity strength. 5/5 bilateral lower extremity strength.  Psychiatric: Normal mood. Normal affect. Good insight. Good judgment.  Neurologic: Alert and on to x3. Cranial nerves II through XII are grossly intact. No focal deficits. Gait not tested secondary to safety.  Labs on Admission:  Basic Metabolic Panel:  Recent Labs Lab 11/16/13 1840 11/16/13 2306  NA 139 138  K 3.9 4.4  CL  --  102  CO2  --  26  GLUCOSE 103* 102*  BUN  --  19  CREATININE  --  0.94  CALCIUM  --  8.5   Liver Function Tests:  Recent  Labs Lab 11/16/13 2306  AST 136*  ALT 64*  ALKPHOS 90  BILITOT 0.7  PROT 7.4  ALBUMIN 2.7*   No results found for this basename: LIPASE, AMYLASE,  in the last 168 hours No results found for this basename: AMMONIA,  in the last 168 hours CBC:  Recent Labs Lab 11/16/13 1422 11/16/13 1840 11/16/13 2306  WBC 5.0  --  5.4  NEUTROABS 2.3  --  3.7  HGB 12.2* 10.2* 9.9*  HCT 36.0* 30.0* 29.6*  MCV 82.2  --  82.0  PLT 41*  --  40*   Cardiac Enzymes: No results found for this basename: CKTOTAL, CKMB, CKMBINDEX, TROPONINI,  in the last 168 hours BNP: No components found with this basename: POCBNP,  CBG: No results found for this basename: GLUCAP,  in the last 168 hours  Radiological Exams on Admission: Ct Abdomen Pelvis W Contrast  11/17/2013   CLINICAL DATA:  Nasopharyngeal lymphadenopathy.  Thrombocytopenia.  EXAM: CT CHEST, ABDOMEN, AND PELVIS WITH CONTRAST  TECHNIQUE: Multidetector CT imaging of the chest, abdomen and pelvis was performed following the standard protocol during bolus administration of intravenous contrast.  CONTRAST:  122mL OMNIPAQUE IOHEXOL 300 MG/ML  SOLN  COMPARISON:  CT MAXILLOFACIAL W/CM dated 11/16/2013  FINDINGS: CT CHEST FINDINGS  There are prominent bilateral axillary lymph nodes which are borderline pathologic in volume measuring up to 10 mm short axis. Thyroid gland is  normal. No mediastinal lymphadenopathy. No pericardial fluid. Esophagus is normal. There is subtle ground-glass nodularity within the right upper lobe (image 26). A similar fine nodular airspace disease is seen in the left and right lower lobes (image 36, example)  CT ABDOMEN AND PELVIS FINDINGS  No focal hepatic lesion. The patient has little intra-abdominal fat which makes evaluation somewhat difficult. The spleen is upper limits of normal volume at 400 cubic cm. The adrenal glands and kidneys are normal.  The stomach, small bowel, and colon are normal.  Abdominal or is normal caliber. No  retroperitoneal periportal lymphadenopathy.  The prostate gland and bladder normal. There is no pelvic lymphadenopathy. No aggressive osseous lesion.  IMPRESSION: 1. Mildly prominent axillary lymph nodes. 2. No additional evidence of lymphadenopathy in the chest, abdomen, or pelvis. 3. Spleen upper limits of normal. 4. Fine nodular airspace disease in the lower lobes is consistent with an infectious or inflammatory process.   Electronically Signed   By: Suzy Bouchard M.D.   On: 11/17/2013 14:52   Ct Maxillofacial W/cm  11/17/2013   ADDENDUM REPORT: 11/17/2013 12:14  ADDENDUM: Third from the bottom paragraph sentence should read: "There is packing in the right NARES."   Electronically Signed   By: Rolla Flatten M.D.   On: 11/17/2013 12:14   11/17/2013   CLINICAL DATA:  Nose bleed.  EXAM: CT MAXILLOFACIAL WITH CONTRAST  TECHNIQUE: Multidetector CT imaging of the maxillofacial structures was performed with intravenous contrast. Multiplanar CT image reconstructions were also generated. A small metallic BB was placed on the right temple in order to reliably differentiate right from left.  CONTRAST:  169mL OMNIPAQUE IOHEXOL 300 MG/ML  SOLN  COMPARISON:  None.  FINDINGS: Markedly enlarged nasopharyngeal adenoids, right greater than left extending into the right nasal cavity. No evidence for juvenile nasopharyngeal angiofibroma (normal pterygopalatine fossa ). Truncation, but no significant remodeling, of the medial pterygoid plates. Equivocal vertical striations noted in the nasopharyngeal adenoids could be a sign of non neoplastic enlargement. Enlarged left greater than right palatine tonsils without focal areas of hypoattenuation. Acute fluid layers in the right maxillary sinus. There is packing in the right near use. Mild posterior ethmoid and right sphenoid air-fluid levels. Negative orbits.No pathologic adenopathy is observed. No intracranial abnormalities are seen.  IMPRESSION: Markedly enlarged nasopharyngeal  adenoids could represent a lymphoproliferative disorder such as lymphoma, nasopharyngeal carcinoma, or represent extreme lymphoid hyperplasia related to a systemic condition such as HIV. This process extends into the right nasal cavity. Correlate clinically. Surgical biopsy is warranted.  No evidence for juvenile nasopharyngeal angiofibroma.  Findings discussed with ordering provider.  Electronically Signed: By: Rolla Flatten M.D. On: 11/16/2013 15:14    EKG: None  Time spent: 65 mins  [facility] MD [facility] Pager [phone]  If 7PM-7AM, please contact night-coverage [email or web address] Password [facility] 11/17/2013, 3:53 PM

## 2013-11-18 DIAGNOSIS — B1920 Unspecified viral hepatitis C without hepatic coma: Secondary | ICD-10-CM

## 2013-11-18 DIAGNOSIS — R768 Other specified abnormal immunological findings in serum: Secondary | ICD-10-CM | POA: Diagnosis present

## 2013-11-18 LAB — GC/CHLAMYDIA PROBE AMP
CT Probe RNA: NEGATIVE
GC PROBE AMP APTIMA: NEGATIVE

## 2013-11-18 LAB — COMPREHENSIVE METABOLIC PANEL
ALBUMIN: 2.4 g/dL — AB (ref 3.5–5.2)
ALT: 52 U/L (ref 0–53)
AST: 104 U/L — ABNORMAL HIGH (ref 0–37)
Alkaline Phosphatase: 78 U/L (ref 39–117)
BUN: 11 mg/dL (ref 6–23)
CO2: 27 mEq/L (ref 19–32)
CREATININE: 0.77 mg/dL (ref 0.50–1.35)
Calcium: 8.4 mg/dL (ref 8.4–10.5)
Chloride: 103 mEq/L (ref 96–112)
GFR calc Af Amer: >90 mL/min (ref >90)
GFR calc non Af Amer: >90 mL/min (ref >90)
Glucose, Bld: 132 mg/dL — ABNORMAL HIGH (ref 70–99)
Potassium: 4.4 mEq/L (ref 3.7–5.3)
Sodium: 136 mEq/L — ABNORMAL LOW (ref 137–147)
TOTAL PROTEIN: 9.4 g/dL — AB (ref 6.0–8.3)
Total Bilirubin: 0.5 mg/dL (ref 0.3–1.2)

## 2013-11-18 LAB — CBC WITH DIFFERENTIAL/PLATELET
BASOS PCT: 0 % (ref 0–1)
Basophils Absolute: 0 10*3/uL (ref 0.0–0.1)
Eosinophils Absolute: 0 10*3/uL (ref 0.0–0.7)
Eosinophils Relative: 0 % (ref 0–5)
HEMATOCRIT: 24.4 % — AB (ref 39.0–52.0)
Hemoglobin: 8.2 g/dL — ABNORMAL LOW (ref 13.0–17.0)
Lymphocytes Relative: 25 % (ref 12–46)
Lymphs Abs: 1.1 10*3/uL (ref 0.7–4.0)
MCH: 27.5 pg (ref 26.0–34.0)
MCHC: 33.6 g/dL (ref 30.0–36.0)
MCV: 81.9 fL (ref 78.0–100.0)
MONO ABS: 0.3 10*3/uL (ref 0.1–1.0)
MONOS PCT: 6 % (ref 3–12)
NEUTROS PCT: 70 % (ref 43–77)
Neutro Abs: 3.1 10*3/uL (ref 1.7–7.7)
Platelets: 55 10*3/uL — ABNORMAL LOW (ref 150–400)
RBC: 2.98 MIL/uL — ABNORMAL LOW (ref 4.22–5.81)
RDW: 14.4 % (ref 11.5–15.5)
WBC: 4.5 10*3/uL (ref 4.0–10.5)

## 2013-11-18 LAB — FERRITIN: Ferritin: 74 ng/mL (ref 22–322)

## 2013-11-18 LAB — HEPATITIS PANEL, ACUTE
HCV Ab: REACTIVE — AB
HEP A IGM: NONREACTIVE
HEP B C IGM: NONREACTIVE
HEP B S AG: NEGATIVE

## 2013-11-18 LAB — FOLATE

## 2013-11-18 LAB — HIV-1 RNA QUANT-NO REFLEX-BLD
HIV 1 RNA Quant: 5132 copies/mL — ABNORMAL HIGH (ref <20)
HIV-1 RNA Quant, Log: 3.71 {Log} — ABNORMAL HIGH (ref <1.30)

## 2013-11-18 LAB — T-HELPER CELLS (CD4) COUNT (NOT AT ARMC)
CD4 T CELL HELPER: 10 % — AB (ref 33–55)
CD4 T Cell Abs: 100 /uL — ABNORMAL LOW (ref 400–2700)

## 2013-11-18 LAB — HIV 1/2 CONFIRMATION
HIV-1 antibody: POSITIVE
HIV-2 Ab: NEGATIVE

## 2013-11-18 LAB — VITAMIN B12: VITAMIN B 12: 385 pg/mL (ref 211–911)

## 2013-11-18 LAB — IRON AND TIBC
IRON: 41 ug/dL — AB (ref 42–135)
Saturation Ratios: 17 % — ABNORMAL LOW (ref 20–55)
TIBC: 238 ug/dL (ref 215–435)
UIBC: 197 ug/dL (ref 125–400)

## 2013-11-18 MED ORDER — CIPROFLOXACIN-DEXAMETHASONE 0.3-0.1 % OT SUSP: 4.0000 [drp] | Freq: Two times a day (BID) | OTIC | Status: DC

## 2013-11-18 MED ORDER — AMOXICILLIN 500 MG PO TABS: 500.0000 mg | ORAL_TABLET | Freq: Three times a day (TID) | ORAL | Status: DC

## 2013-11-18 MED ORDER — SALINE SPRAY 0.65 % NA SOLN: 1.0000 | NASAL | Status: DC | PRN

## 2013-11-18 MED ORDER — FERROUS GLUCONATE 324 (38 FE) MG PO TABS: 324.0000 mg | ORAL_TABLET | Freq: Two times a day (BID) | ORAL | Status: DC

## 2013-11-18 MED ORDER — OXYCODONE HCL 5 MG PO TABS: 5.0000 mg | ORAL_TABLET | ORAL | Status: DC | PRN

## 2013-11-18 NOTE — Discharge Summary (Signed)
Physician Discharge Summary  [name] [id number] DOB: 1986-04-02 DOA: 11/16/2013  PCP: Default, Provider, MD  Admit date: 11/16/2013 Discharge date: 11/18/2013  Time spent: 65 minutes  Recommendations for Outpatient Follow-up:  1. Followup with Dr. Erik Obey of ENT in 2 weeks. 2. Followup with Dr. Megan Salon of infectious disease. On followup patient will likely need a hepatitis C viral load to be obtained. CD4 count will need to be followed up upon.  3. Followup with Dr. Marin Olp, hematology in 1 week. On followup CBC will need to be obtained to followup on patient's hemoglobin and thrombocytopenia. 4. Followup with PCP one to 2 weeks. On Followup at comprehensive metabolic profile needs to be obtained to followup on patient's electrolytes, renal function, LFTs.  Discharge Diagnoses:  Principal Problem:   Nasopharyngeal mass Active Problems:   Thrombocytopenia, unspecified   Human immunodeficiency virus (HIV) disease   Epistaxis   Normocytic anemia   Elevated liver enzymes   Chronic otitis media   Transaminitis   Positive hepatitis C antibody test   Discharge Condition: Stable and improved  Diet recommendation: Regular  Filed Weights   11/16/13 2223  Weight: 85.6 kg (188 lb 11.4 oz)    History of present illness:  Patient is a 28 year old otherwise healthy gentleman who developed right-sided epistaxis which has been intermittent over the past 2 months prior to admission. The day prior to admission patient had some epistaxis from both nares. Patient had been seen in the emergency room 2 weeks prior to admission for nasal congestion ear congestion nasal drainage and treated with Augmentin for sinus infection. Patient had presented to Dr. Noreene Filbert office, ENT for his evaluation of his epistaxis. CT scan which was done showed a nasopharyngeal mass and patient was subsequently admitted 11/16/13 to the ENT service where he underwent nasal endoscopy with biopsy, adenoidectomy with  control of hemorrhage, bilateral myringotomy with T-tube placement. Patient denied any fevers, no chills, no nausea, no vomiting, no chest pain, no abdominal pain, no diarrhea, no constipation, no dysuria, no melena, no hematemesis, no hematochezia, no weakness. Patient does endorse some shortness of breath with his nasal packing. During patient's workup he was noted to be anemic, thrombocytopenic and HIV EIA screening test came back positive.    Hospital Course:  #1 nasopharyngeal mass  Patient had presented to the ENTs office with epistaxis. Patient underwent a CT of the maxillofacial which is concerning for a nasopharyngeal mass. Patient was subsequently admitted under the ENT service and underwent nasal endoscopy with biopsy, adenectomy with control of hemorrhage, bilateral myringotomy with T-tube placement [id number] per Dr. Erik Obey. Biopsies were obtained and were pending at time of discharge. Patient's epistaxis resolved and did not have any further bleeding. Patient was placed on Ciprodex eardrops. During his workup he was noted to be HIV positive and it was felt this might be likely related to his nasopharyngeal mass. Patient will followup with Dr. Erik Obey of ENT 2 weeks postdischarge. #2 probable HIV infection  HIV EIA was positive. Confirmatory testing with Western blot and viral load were obtained. Patient's viral load was elevated at 5132 with CD4 count pending. Infectious disease was consulted and patient was seen in consultation by Dr. Megan Salon. A GC Chlamydia probe was obtained was negative. RPR was also obtained which came back negative. Acute hepatitis panel which was obtained did show a positive hepatitis C viral antibody. Patient will followup with Dr. Megan Salon of infectious disease as outpatient for further management of probable HIV infection as well as further lab testing  including viral load for hepatitis C. #3 thrombocytopenia  Patient was noted to have a thrombocytopenia. Patient  has been seen by hematology who is evaluating etiology. It is felt by hematology that patient's thrombocytopenia and anemia may be related in part to positive HIV infection and should improve with treatment. LDH was obtained which came back elevated. Peripheral smear which was done did not show any schistocytes. Platelets were well granulated and somewhat large. There was no rouleaux formation. No target cells seen. Patient received 2 doses of IVIG, with some improvement in his platelet count. Patient did not have any further bleeding during the hospitalization. CT of the abdomen and pelvis which was done did not show any focal hepatic lesions however did show the spleen was in the upper limits of normal. Patient will followup with hematology as outpatient. #4 anemia  No overt GI bleed. May be secondary to problem #2. Patient did not have any further bleeding during the hospitalization. Anemia panel is pending at the time of discharge and needs to be followed up as outpatient.  #5 epistaxis  Likely secondary to problem #1. Improved. Per ENT.  #6 transaminitis/positive hepatitis C antibody  On admission patient was noted to have a transaminitis. Acute hepatitis panel was obtained which had a positive hepatitis C antibody noted. CT of the abdomen and pelvis did not show any focal hepatic lesions. Patient's LFTs were trending down. Patient will followup with ID as outpatient for further evaluation at which time a viral load will likely need to be obtained. Patient will need repeat comprehensive metabolic profile done to followup on his transaminitis. Questionable etiology. Acute hepatitis panel pending. CT of the abdomen and pelvis with no focal hepatic lesion   Procedures: Nasal endoscopy with biopsy. Adenoidectomy with control of hemorrhage. Bilateral myringotomy with T-tube placement per Dr. Erik Obey 12/19/38 CT chest abdomen and pelvis 11/17/2013 CT maxillofacial 11/16/2013   Consultations:  Dr. [name] infectious diseases 11/17/2013  Hematology/oncology: Dr. Marin Olp  08/[id number]/[id number]  ENT: Dr. Erik Obey initially admitted under ENT service 11/16/2013  Discharge Exam: Filed Vitals:   11/18/13 1008  BP: 145/82  Pulse: 96  Temp: 98.2 F (36.8 C)  Resp: 18    General: NAD.  Cardiovascular: RRR Respiratory: CTAB  Discharge Instructions      Discharge Orders   Future Orders Complete By Expires   Diet general  As directed    Discharge instructions  As directed    Comments:     Follow up with Dr Erik Obey in 2 weeks. Follow up with Dr Marin Olp in 1 week. Follow up with Dr Megan Salon in 1-2 weeks Follow up with PCP in 2 weeks.   Increase activity slowly  As directed        Medication List         acetaminophen 500 MG tablet  Commonly known as:  TYLENOL  Take 500 mg by mouth every 6 (six) hours as needed for mild pain or headache.     amoxicillin 500 MG tablet  Commonly known as:  AMOXIL  Take 1 tablet (500 mg total) by mouth 3 (three) times daily. Take for 6 days then stop.     ciprofloxacin-dexamethasone otic suspension  Commonly known as:  CIPRODEX  Place 4 drops into both ears 2 (two) times daily. Take for 3 days then stop.     ferrous gluconate 324 MG tablet  Commonly known as:  FERGON  Take 1 tablet (324 mg total) by mouth 2 (two) times daily with a  meal.     ipratropium 0.03 % nasal spray  Commonly known as:  ATROVENT  Place 2 sprays into both nostrils every 6 (six) hours as needed for rhinitis.     oxyCODONE 5 MG immediate release tablet  Commonly known as:  Oxy IR/ROXICODONE  Take 1 tablet (5 mg total) by mouth every 4 (four) hours as needed for severe pain.     sodium chloride 0.65 % Soln nasal spray  Commonly known as:  OCEAN  Place 1 spray into both nostrils as needed for congestion.       No Known Allergies Follow-up Information   Follow up with Jodi Marble, MD. Schedule an appointment as soon as possible for a visit in 2 weeks.    Specialty:  Otolaryngology   Contact information:   [address] [place] [address] [phone]       Follow up with Volanda Napoleon, MD In 1 week.   Specialty:  Oncology   Contact information:   [address], [address] [place] [address] [phone]       Schedule an appointment as soon as possible for a visit in 2 weeks to follow up. (f/u with PCP in 1-2 weeks)       Follow up with [name], MD. (Patient will be called with appointment time. If no response in 1 week then call office to schedule appointment.)    Specialty:  Infectious Diseases   Contact information:   [address]  [place] [address] [phone]        The results of significant diagnostics from this hospitalization (including imaging, microbiology, ancillary and laboratory) are listed below for reference.    Significant Diagnostic Studies: Ct Abdomen Pelvis W Contrast  11/17/2013   CLINICAL DATA:  Nasopharyngeal lymphadenopathy.  Thrombocytopenia.  EXAM: CT CHEST, ABDOMEN, AND PELVIS WITH CONTRAST  TECHNIQUE: Multidetector CT imaging of the chest, abdomen and pelvis was performed following the standard protocol during bolus administration of intravenous contrast.  CONTRAST:  130mL OMNIPAQUE IOHEXOL 300 MG/ML  SOLN  COMPARISON:  CT MAXILLOFACIAL W/CM dated 11/16/2013  FINDINGS: CT CHEST FINDINGS  There are prominent bilateral axillary lymph nodes which are borderline pathologic in volume measuring up to 10 mm short axis. Thyroid gland is normal. No mediastinal lymphadenopathy. No pericardial fluid. Esophagus is normal. There is subtle ground-glass nodularity within the right upper lobe (image 26). A similar fine nodular airspace disease is seen in the left and right lower lobes (image 36, example)  CT ABDOMEN AND PELVIS FINDINGS  No focal hepatic lesion. The patient has little intra-abdominal fat which makes evaluation somewhat difficult. The spleen is upper limits of  normal volume at 400 cubic cm. The adrenal glands and kidneys are normal.  The stomach, small bowel, and colon are normal.  Abdominal or is normal caliber. No retroperitoneal periportal lymphadenopathy.  The prostate gland and bladder normal. There is no pelvic lymphadenopathy. No aggressive osseous lesion.  IMPRESSION: 1. Mildly prominent axillary lymph nodes. 2. No additional evidence of lymphadenopathy in the chest, abdomen, or pelvis. 3. Spleen upper limits of normal. 4. Fine nodular airspace disease in the lower lobes is consistent with an infectious or inflammatory process.   Electronically Signed   By: Suzy Bouchard M.D.   On: 11/17/2013 14:52   Ct Maxillofacial W/cm  11/17/2013   ADDENDUM REPORT: 11/17/2013 12:14  ADDENDUM: Third from the bottom paragraph sentence should read: "There is packing in the right NARES."  Electronically Signed   By: Rolla Flatten M.D.   On: 11/17/2013 12:14   11/17/2013   CLINICAL DATA:  Nose bleed.  EXAM: CT MAXILLOFACIAL WITH CONTRAST  TECHNIQUE: Multidetector CT imaging of the maxillofacial structures was performed with intravenous contrast. Multiplanar CT image reconstructions were also generated. A small metallic BB was placed on the right temple in order to reliably differentiate right from left.  CONTRAST:  137mL OMNIPAQUE IOHEXOL 300 MG/ML  SOLN  COMPARISON:  None.  FINDINGS: Markedly enlarged nasopharyngeal adenoids, right greater than left extending into the right nasal cavity. No evidence for juvenile nasopharyngeal angiofibroma (normal pterygopalatine fossa ). Truncation, but no significant remodeling, of the medial pterygoid plates. Equivocal vertical striations noted in the nasopharyngeal adenoids could be a sign of non neoplastic enlargement. Enlarged left greater than right palatine tonsils without focal areas of hypoattenuation. Acute fluid layers in the right maxillary sinus. There is packing in the right near use. Mild posterior ethmoid and right sphenoid  air-fluid levels. Negative orbits.No pathologic adenopathy is observed. No intracranial abnormalities are seen.  IMPRESSION: Markedly enlarged nasopharyngeal adenoids could represent a lymphoproliferative disorder such as lymphoma, nasopharyngeal carcinoma, or represent extreme lymphoid hyperplasia related to a systemic condition such as HIV. This process extends into the right nasal cavity. Correlate clinically. Surgical biopsy is warranted.  No evidence for juvenile nasopharyngeal angiofibroma.  Findings discussed with ordering provider.  Electronically Signed: By: Rolla Flatten M.D. On: 11/16/2013 15:14    Microbiology: Recent Results (from the past 240 hour(s))  GC/CHLAMYDIA PROBE AMP     Status: None   Collection Time    11/17/13  2:00 PM      Result Value Ref Range Status   CT Probe RNA NEGATIVE  NEGATIVE Final   GC Probe RNA NEGATIVE  NEGATIVE Final   Comment: (NOTE)                                                                                               **Normal Reference Range: Negative**          Assay performed using the Gen-Probe APTIMA COMBO2 (R) Assay.     Acceptable specimen types for this assay include APTIMA Swabs (Unisex,     endocervical, urethral, or vaginal), first void urine, and ThinPrep     liquid based cytology samples.     Performed at [facility]: Basic Metabolic Panel:  Recent Labs Lab 11/16/13 1840 11/16/13 2306 11/18/13 0625  NA 139 138 136*  K 3.9 4.4 4.4  CL  --  102 103  CO2  --  26 27  GLUCOSE 103* 102* 132*  BUN  --  19 11  CREATININE  --  0.94 0.77  CALCIUM  --  8.5 8.4   Liver Function Tests:  Recent Labs Lab 11/16/13 2306 11/18/13 0625  AST 136* 104*  ALT 64* 52  ALKPHOS 90 78  BILITOT 0.7 0.5  PROT 7.4 9.4*  ALBUMIN 2.7* 2.4*   No results found for this basename: LIPASE, AMYLASE,  in the last 168 hours No results found for this  basename: AMMONIA,  in the last 168 hours CBC:  Recent Labs Lab  11/16/13 1422 11/16/13 1840 11/16/13 2306 11/18/13 0625  WBC 5.0  --  5.4 4.5  NEUTROABS 2.3  --  3.7 3.1  HGB 12.2* 10.2* 9.9* 8.2*  HCT 36.0* 30.0* 29.6* 24.4*  MCV 82.2  --  82.0 81.9  PLT 41*  --  40* 55*   Cardiac Enzymes: No results found for this basename: CKTOTAL, CKMB, CKMBINDEX, TROPONINI,  in the last 168 hours BNP: BNP (last 3 results) No results found for this basename: PROBNP,  in the last 8760 hours CBG: No results found for this basename: GLUCAP,  in the last 168 hours     Signed:  [facility] MD [facility] 11/18/2013, 11:58 AM

## 2013-11-18 NOTE — Progress Notes (Signed)
Patient ID: [name], male   DOB: 23-Jan-1986, 28 y.o.   MRN: [id number]         [facility] for Infectious [facility]    Date of Admission:  11/16/2013       Principal Problem:   Nasopharyngeal mass Active Problems:   Thrombocytopenia, unspecified   Human immunodeficiency virus (HIV) disease   Epistaxis   Normocytic anemia   Elevated liver enzymes   Chronic otitis media   Transaminitis   Positive hepatitis C antibody test   . amoxicillin  500 mg Oral 3 times per day  . ciprofloxacin-dexamethasone  4 drop Both Ears BID  . ferrous gluconate  324 mg Oral BID WC  . folic acid  2 mg Oral Daily    Subjective: He is feeling better. He has had no further nosebleeds.   History reviewed. No pertinent past medical history.  History  Substance Use Topics  . Smoking status: Current Every Day Smoker -- 0.25 packs/day  . Smokeless tobacco: Not on file  . Alcohol Use: No    History reviewed. No pertinent family history.  No Known Allergies  Objective: Temp:  [97.6 F (36.4 C)-98.8 F (37.1 C)] 98.2 F (36.8 C) (02/13 1008) Pulse Rate:  [78-100] 96 (02/13 1008) Resp:  [17-20] 18 (02/13 1008) BP: (120-145)/(69-84) 145/82 mmHg (02/13 1008) SpO2:  [97 %-100 %] 99 % (02/13 1008)  General: He is alert and in no distress  Lab Results Lab Results  Component Value Date   WBC 4.5 11/18/2013   HGB 8.2* 11/18/2013   HCT 24.4* 11/18/2013   MCV 81.9 11/18/2013   PLT 55* 11/18/2013    Lab Results  Component Value Date   CREATININE 0.77 11/18/2013   BUN 11 11/18/2013   NA 136* 11/18/2013   K 4.4 11/18/2013   CL 103 11/18/2013   CO2 27 11/18/2013    Lab Results  Component Value Date   ALT 52 11/18/2013   AST 104* 11/18/2013   ALKPHOS 78 11/18/2013   BILITOT 0.5 11/18/2013      HIV 1 RNA Quant (copies/mL)  Date Value  11/16/2013 5132*   Microbiology: Recent Results (from the past 240 hour(s))  GC/CHLAMYDIA PROBE AMP     Status: None   Collection Time    11/17/13  2:00 PM       Result Value Ref Range Status   CT Probe RNA NEGATIVE  NEGATIVE Final   GC Probe RNA NEGATIVE  NEGATIVE Final   Comment: (NOTE)                                                                                               **Normal Reference Range: Negative**          Assay performed using the Gen-Probe APTIMA COMBO2 (R) Assay.     Acceptable specimen types for this assay include APTIMA Swabs (Unisex,     endocervical, urethral, or vaginal), first void urine, and ThinPrep     liquid based cytology samples.     Performed at [facility]    Studies/Results: Ct Abdomen Pelvis W Contrast  11/17/2013  CLINICAL DATA:  Nasopharyngeal lymphadenopathy.  Thrombocytopenia.  EXAM: CT CHEST, ABDOMEN, AND PELVIS WITH CONTRAST  TECHNIQUE: Multidetector CT imaging of the chest, abdomen and pelvis was performed following the standard protocol during bolus administration of intravenous contrast.  CONTRAST:  140mL OMNIPAQUE IOHEXOL 300 MG/ML  SOLN  COMPARISON:  CT MAXILLOFACIAL W/CM dated 11/16/2013  FINDINGS: CT CHEST FINDINGS  There are prominent bilateral axillary lymph nodes which are borderline pathologic in volume measuring up to 10 mm short axis. Thyroid gland is normal. No mediastinal lymphadenopathy. No pericardial fluid. Esophagus is normal. There is subtle ground-glass nodularity within the right upper lobe (image 26). A similar fine nodular airspace disease is seen in the left and right lower lobes (image 36, example)  CT ABDOMEN AND PELVIS FINDINGS  No focal hepatic lesion. The patient has little intra-abdominal fat which makes evaluation somewhat difficult. The spleen is upper limits of normal volume at 400 cubic cm. The adrenal glands and kidneys are normal.  The stomach, small bowel, and colon are normal.  Abdominal or is normal caliber. No retroperitoneal periportal lymphadenopathy.  The prostate gland and bladder normal. There is no pelvic lymphadenopathy. No aggressive osseous lesion.   IMPRESSION: 1. Mildly prominent axillary lymph nodes. 2. No additional evidence of lymphadenopathy in the chest, abdomen, or pelvis. 3. Spleen upper limits of normal. 4. Fine nodular airspace disease in the lower lobes is consistent with an infectious or inflammatory process.   Electronically Signed   By: Suzy Bouchard M.D.   On: 11/17/2013 14:52   Ct Maxillofacial W/cm  11/17/2013   ADDENDUM REPORT: 11/17/2013 12:14  ADDENDUM: Third from the bottom paragraph sentence should read: "There is packing in the right NARES."   Electronically Signed   By: Rolla Flatten M.D.   On: 11/17/2013 12:14   11/17/2013   CLINICAL DATA:  Nose bleed.  EXAM: CT MAXILLOFACIAL WITH CONTRAST  TECHNIQUE: Multidetector CT imaging of the maxillofacial structures was performed with intravenous contrast. Multiplanar CT image reconstructions were also generated. A small metallic BB was placed on the right temple in order to reliably differentiate right from left.  CONTRAST:  126mL OMNIPAQUE IOHEXOL 300 MG/ML  SOLN  COMPARISON:  None.  FINDINGS: Markedly enlarged nasopharyngeal adenoids, right greater than left extending into the right nasal cavity. No evidence for juvenile nasopharyngeal angiofibroma (normal pterygopalatine fossa ). Truncation, but no significant remodeling, of the medial pterygoid plates. Equivocal vertical striations noted in the nasopharyngeal adenoids could be a sign of non neoplastic enlargement. Enlarged left greater than right palatine tonsils without focal areas of hypoattenuation. Acute fluid layers in the right maxillary sinus. There is packing in the right near use. Mild posterior ethmoid and right sphenoid air-fluid levels. Negative orbits.No pathologic adenopathy is observed. No intracranial abnormalities are seen.  IMPRESSION: Markedly enlarged nasopharyngeal adenoids could represent a lymphoproliferative disorder such as lymphoma, nasopharyngeal carcinoma, or represent extreme lymphoid hyperplasia related  to a systemic condition such as HIV. This process extends into the right nasal cavity. Correlate clinically. Surgical biopsy is warranted.  No evidence for juvenile nasopharyngeal angiofibroma.  Findings discussed with ordering provider.  Electronically Signed: By: Rolla Flatten M.D. On: 11/16/2013 15:14    Assessment: His positive HIV viral load confirms active HIV infection. I discussed this information with him and told him that he does have HIV infection. He is appropriately upset. He is also hepatitis C antibody positive. He will need a hepatitis C viral load to determine if he has active hepatitis. Will obtain  further blood work when he comes in to see me in clinic. I've given him my office number and asked him to call me if he has any questions or concerns before his first visit.  Plan: 1. Agree with discharge today 2. I will arrange followup at her clinic soon  [name], MD [facility] for [place] [phone] pager   [phone] cell 11/18/2013, 11:37 AM

## 2013-11-18 NOTE — Progress Notes (Signed)
He seems to be doing better. No bleeding is noted. No results back from the biopsy.  He got his 2 doses of IVIG.  No labs are back yet this morning.  He is hepatitis C positive. I don't think this is a real surprise.  Infectious disease is following him. They will make recommendations for therapy for the HIV. I'm sure that also will look into the hepatitis C.  He had a CT scan done. I reviewed the scan. No formal report is back yet. 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 This is trying to be from the HIV or could be from hepatitis C.  I suspect that the thrombocytopenia is more involved than just HIV. With hepatitis C, this is certainly could lead to thrombocytopenia. He does have splenomegaly which also would lead to thrombocytopenia.  He really wants to go home today. I think the my point of view, as long as he is not bleeding, I think we are okay to let him go home.  I think the real key for Mr. Scifres is to make sure that he has followup with infectious disease. I suspect that his thrombocytopenia will improve with HIV therapy and also hepatitis C therapy.  I still think the likelihood of a malignancy is quite low.  It will be interesting to see what his CD4 count is.  I'll I'll be more than to have a 2 follow him up as an outpatient if necessary. Again, I would think that once it he is on treatment for HIV, his hematologic abnormalities  should improve.  Joylene John 6:9

## 2013-11-18 NOTE — Care Management Note (Signed)
    Page 1 of 1   11/18/2013     11:55:31 AM   CARE MANAGEMENT NOTE 11/18/2013  Patient:  [name]   Account Number:  [id number]  Date Initiated:  11/18/2013  Documentation initiated by:  Magdalen Spatz  Subjective/Objective Assessment:     Action/Plan:   Anticipated DC Date:  11/18/2013   Anticipated DC Plan:  HOME/SELF CARE         Choice offered to / List presented to:             Status of service:   Medicare Important Message given?   (If response is "NO", the following Medicare IM given date fields will be blank) Date Medicare IM given:   Date Additional Medicare IM given:    Discharge Disposition:    Per UR Regulation:    If discussed at Long Length of Stay Meetings, dates discussed:    Comments:  11-18-13 [facility] letter given and explained to patient . [facility] information given and explained to patient , patient called [facility] to make appointment. Magdalen Spatz RN [facility] [phone]

## 2013-11-18 NOTE — Progress Notes (Signed)
11/18/2013 9:01 AM  [place], Minna Merritts [id number]  Post-Op Day 2    Temp:  [97.6 F (36.4 C)-98.8 F (37.1 C)] 97.9 F (36.6 C) (02/13 0509) Pulse Rate:  [78-100] 78 (02/13 0509) Resp:  [17-20] 20 (02/13 0509) BP: (120-137)/(69-84) 121/71 mmHg (02/13 0509) SpO2:  [97 %-100 %] 99 % (02/13 0509),     Intake/Output Summary (Last 24 hours) at 11/18/13 0901 Last data filed at 11/18/13 0600  Gross per 24 hour  Intake   3350 ml  Output   1998 ml  Net   1352 ml    Results for orders placed during the hospital encounter of 11/16/13 (from the past 24 hour(s))  HEPATITIS A ANTIBODY, TOTAL     Status: Abnormal   Collection Time    11/17/13 12:40 PM      Result Value Ref Range   Hep A Total Ab Reactive (*) NON REACTIVE  HEPATITIS B SURFACE ANTIBODY     Status: Abnormal   Collection Time    11/17/13 12:40 PM      Result Value Ref Range   Hep B S Ab POSITIVE (*) NEGATIVE  HEPATITIS B SURFACE ANTIGEN     Status: None   Collection Time    11/17/13 12:40 PM      Result Value Ref Range   Hepatitis B Surface Ag NEGATIVE  NEGATIVE  HEPATITIS C ANTIBODY     Status: Abnormal   Collection Time    11/17/13 12:40 PM      Result Value Ref Range   HCV Ab Reactive (*) NEGATIVE  RPR     Status: None   Collection Time    11/17/13 12:40 PM      Result Value Ref Range   RPR NON REACTIVE  NON REACTIVE  CBC WITH DIFFERENTIAL     Status: Abnormal   Collection Time    11/18/13  6:25 AM      Result Value Ref Range   WBC 4.5  4.0 - 10.5 K/uL   RBC 2.98 (*) 4.22 - 5.81 MIL/uL   Hemoglobin 8.2 (*) 13.0 - 17.0 g/dL   HCT 24.4 (*) 39.0 - 52.0 %   MCV 81.9  78.0 - 100.0 fL   MCH 27.5  26.0 - 34.0 pg   MCHC 33.6  30.0 - 36.0 g/dL   RDW 14.4  11.5 - 15.5 %   Platelets 55 (*) 150 - 400 K/uL   Neutrophils Relative % 70  43 - 77 %   Neutro Abs 3.1  1.7 - 7.7 K/uL   Lymphocytes Relative 25  12 - 46 %   Lymphs Abs 1.1  0.7 - 4.0 K/uL   Monocytes Relative 6  3 - 12 %   Monocytes Absolute 0.3  0.1 - 1.0  K/uL   Eosinophils Relative 0  0 - 5 %   Eosinophils Absolute 0.0  0.0 - 0.7 K/uL   Basophils Relative 0  0 - 1 %   Basophils Absolute 0.0  0.0 - 0.1 K/uL  COMPREHENSIVE METABOLIC PANEL     Status: Abnormal   Collection Time    11/18/13  6:25 AM      Result Value Ref Range   Sodium 136 (*) 137 - 147 mEq/L   Potassium 4.4  3.7 - 5.3 mEq/L   Chloride 103  96 - 112 mEq/L   CO2 27  19 - 32 mEq/L   Glucose, Bld 132 (*) 70 - 99 mg/dL   BUN 11  6 -  23 mg/dL   Creatinine, Ser 0.77  0.50 - 1.35 mg/dL   Calcium 8.4  8.4 - 10.5 mg/dL   Total Protein 9.4 (*) 6.0 - 8.3 g/dL   Albumin 2.4 (*) 3.5 - 5.2 g/dL   AST 104 (*) 0 - 37 U/L   ALT 52  0 - 53 U/L   Alkaline Phosphatase 78  39 - 117 U/L   Total Bilirubin 0.5  0.3 - 1.2 mg/dL   GFR calc non Af Amer >90  >90 mL/min   GFR calc Af Amer >90  >90 mL/min    SUBJECTIVE:  Ears stuffy, nose stuffy.   Pain minimal.  Taking po reg diet.  No further bleeding.  OBJECTIVE:  Hyponasal.  Eating breakfast  IMPRESSION:  Doing well from my standpoint.  PLAN:  OK for discharge home by me.  Regular diet.  Resume activities Monday.  Recheck my office 2 weeks, [id number].  Continue ear drops for 3 days total.  Pt requests his parents do not know all the details of his health situation.  Jodi Marble

## 2013-11-19 LAB — HIV-1 RNA ULTRAQUANT REFLEX TO GENTYP+
HIV 1 RNA Quant: 10190 copies/mL — ABNORMAL HIGH (ref <20)
HIV-1 RNA Quant, Log: 4.01 {Log} — ABNORMAL HIGH (ref <1.30)

## 2013-11-21 LAB — QUANTIFERON TB GOLD ASSAY (BLOOD)
Mitogen value: 0.1 IU/mL
QUANTIFERON NIL VALUE: 0.02 [IU]/mL
TB AG VALUE: 0.01 [IU]/mL
TB Antigen Minus Nil Value: 0 IU/mL

## 2013-11-24 ENCOUNTER — Telehealth: Payer: Self-pay | Admitting: Hematology & Oncology

## 2013-11-24 ENCOUNTER — Encounter: Payer: Self-pay | Admitting: Hematology & Oncology

## 2013-11-24 ENCOUNTER — Other Ambulatory Visit: Payer: Self-pay | Admitting: Hematology & Oncology

## 2013-11-24 DIAGNOSIS — D696 Thrombocytopenia, unspecified: Secondary | ICD-10-CM

## 2013-11-24 DIAGNOSIS — D693 Immune thrombocytopenic purpura: Secondary | ICD-10-CM

## 2013-11-24 HISTORY — DX: Immune thrombocytopenic purpura: D69.3

## 2013-11-24 NOTE — Telephone Encounter (Signed)
Left pt message to call and schedule appointment. Dr. Marin Olp would like to see him within 2 weeks

## 2013-11-25 ENCOUNTER — Encounter (HOSPITAL_COMMUNITY): Payer: Self-pay | Admitting: Emergency Medicine

## 2013-11-25 ENCOUNTER — Inpatient Hospital Stay (HOSPITAL_COMMUNITY)
Admission: EM | Admit: 2013-11-25 | Discharge: 2013-11-27 | DRG: 813 | Disposition: A | Discharge status: Home / Self Care | Payer: MEDICAID | Attending: Internal Medicine | Admitting: Internal Medicine

## 2013-11-25 DIAGNOSIS — D693 Immune thrombocytopenic purpura: Principal | ICD-10-CM | POA: Diagnosis present

## 2013-11-25 DIAGNOSIS — B1920 Unspecified viral hepatitis C without hepatic coma: Secondary | ICD-10-CM | POA: Diagnosis present

## 2013-11-25 DIAGNOSIS — B20 Human immunodeficiency virus [HIV] disease: Secondary | ICD-10-CM | POA: Diagnosis present

## 2013-11-25 DIAGNOSIS — R894 Abnormal immunological findings in specimens from other organs, systems and tissues: Secondary | ICD-10-CM

## 2013-11-25 DIAGNOSIS — D696 Thrombocytopenia, unspecified: Secondary | ICD-10-CM | POA: Diagnosis present

## 2013-11-25 DIAGNOSIS — F17200 Nicotine dependence, unspecified, uncomplicated: Secondary | ICD-10-CM | POA: Diagnosis present

## 2013-11-25 DIAGNOSIS — R768 Other specified abnormal immunological findings in serum: Secondary | ICD-10-CM

## 2013-11-25 DIAGNOSIS — D509 Iron deficiency anemia, unspecified: Secondary | ICD-10-CM | POA: Diagnosis present

## 2013-11-25 DIAGNOSIS — D649 Anemia, unspecified: Secondary | ICD-10-CM | POA: Diagnosis present

## 2013-11-25 DIAGNOSIS — R7689 Other specified abnormal immunological findings in serum: Secondary | ICD-10-CM | POA: Diagnosis present

## 2013-11-25 DIAGNOSIS — R040 Epistaxis: Secondary | ICD-10-CM | POA: Diagnosis present

## 2013-11-25 DIAGNOSIS — R599 Enlarged lymph nodes, unspecified: Secondary | ICD-10-CM | POA: Diagnosis present

## 2013-11-25 LAB — CBC WITH DIFFERENTIAL/PLATELET
BASOS ABS: 0 10*3/uL (ref 0.0–0.1)
BASOS PCT: 0 % (ref 0–1)
Eosinophils Absolute: 0 10*3/uL (ref 0.0–0.7)
Eosinophils Relative: 0 % (ref 0–5)
HEMATOCRIT: 27.7 % — AB (ref 39.0–52.0)
HEMOGLOBIN: 9.2 g/dL — AB (ref 13.0–17.0)
LYMPHS ABS: 1.9 10*3/uL (ref 0.7–4.0)
Lymphocytes Relative: 38 % (ref 12–46)
MCH: 27.2 pg (ref 26.0–34.0)
MCHC: 33.2 g/dL (ref 30.0–36.0)
MCV: 82 fL (ref 78.0–100.0)
Monocytes Absolute: 1 10*3/uL (ref 0.1–1.0)
Monocytes Relative: 21 % — ABNORMAL HIGH (ref 3–12)
NEUTROS PCT: 40 % — AB (ref 43–77)
Neutro Abs: 2 10*3/uL (ref 1.7–7.7)
Platelets: 63 10*3/uL — ABNORMAL LOW (ref 150–400)
RBC: 3.38 MIL/uL — AB (ref 4.22–5.81)
RDW: 14.7 % (ref 11.5–15.5)
WBC: 4.9 10*3/uL (ref 4.0–10.5)

## 2013-11-25 LAB — RETICULOCYTES
RBC.: 3.31 MIL/uL — ABNORMAL LOW (ref 4.22–5.81)
RETIC COUNT ABSOLUTE: 59.6 10*3/uL (ref 19.0–186.0)
Retic Ct Pct: 1.8 % (ref 0.4–3.1)

## 2013-11-25 LAB — APTT: aPTT: 40 seconds — ABNORMAL HIGH (ref 24–37)

## 2013-11-25 LAB — BASIC METABOLIC PANEL
BUN: 13 mg/dL (ref 6–23)
CALCIUM: 8.5 mg/dL (ref 8.4–10.5)
CHLORIDE: 101 meq/L (ref 96–112)
CO2: 26 meq/L (ref 19–32)
Creatinine, Ser: 0.75 mg/dL (ref 0.50–1.35)
GFR calc non Af Amer: >90 mL/min (ref >90)
Glucose, Bld: 90 mg/dL (ref 70–99)
Potassium: 3.7 mEq/L (ref 3.7–5.3)
SODIUM: 138 meq/L (ref 137–147)

## 2013-11-25 LAB — PROTIME-INR
INR: 1.06 (ref 0.00–1.49)
PROTHROMBIN TIME: 13.6 s (ref 11.6–15.2)

## 2013-11-25 LAB — TYPE AND SCREEN
ABO/RH(D): O POS
Antibody Screen: NEGATIVE

## 2013-11-25 LAB — HLA B*5701: HLA B 5701: NEGATIVE

## 2013-11-25 LAB — LACTATE DEHYDROGENASE: LDH: 411 U/L — ABNORMAL HIGH (ref 94–250)

## 2013-11-25 LAB — SAVE SMEAR

## 2013-11-25 LAB — ABO/RH: ABO/RH(D): O POS

## 2013-11-25 MED ORDER — SODIUM CHLORIDE 0.9 % IJ SOLN
3.0000 mL | Freq: Two times a day (BID) | INTRAMUSCULAR | Status: DC
  Administered 2013-11-25: 3 mL via INTRAVENOUS

## 2013-11-25 MED ORDER — OXYCODONE HCL 5 MG PO TABS: 5.0000 mg | ORAL_TABLET | ORAL | Status: DC | PRN

## 2013-11-25 MED ORDER — AMOXICILLIN 500 MG PO CAPS
500.0000 mg | ORAL_CAPSULE | Freq: Three times a day (TID) | ORAL | Status: DC
  Administered 2013-11-25 – 2013-11-27 (×5): 500 mg via ORAL
  Filled 2013-11-25 (×8): qty 1

## 2013-11-25 MED ORDER — IMMUNE GLOBULIN (HUMAN) 10 GM/200ML IV SOLN
1.0000 g/kg | Freq: Once | INTRAVENOUS | Status: AC
  Administered 2013-11-26: 80 g via INTRAVENOUS
  Filled 2013-11-25: qty 1600

## 2013-11-25 MED ORDER — ALBUTEROL SULFATE (2.5 MG/3ML) 0.083% IN NEBU: 2.5000 mg | INHALATION_SOLUTION | RESPIRATORY_TRACT | Status: DC | PRN

## 2013-11-25 MED ORDER — CIPROFLOXACIN-DEXAMETHASONE 0.3-0.1 % OT SUSP
4.0000 [drp] | Freq: Two times a day (BID) | OTIC | Status: DC
  Administered 2013-11-25 – 2013-11-27 (×3): 4 [drp] via OTIC
  Filled 2013-11-25 (×2): qty 7.5

## 2013-11-25 MED ORDER — SALINE SPRAY 0.65 % NA SOLN
1.0000 | NASAL | Status: DC | PRN
  Administered 2013-11-25 – 2013-11-26 (×2): 1 via NASAL
  Filled 2013-11-25: qty 44

## 2013-11-25 MED ORDER — AMOXICILLIN 500 MG PO TABS: 500.0000 mg | ORAL_TABLET | Freq: Three times a day (TID) | ORAL | Status: DC

## 2013-11-25 MED ORDER — IMMUNE GLOBULIN (HUMAN) 5 GM/100ML IV SOLN
1.0000 g/kg | Freq: Once | INTRAVENOUS | Status: DC
  Filled 2013-11-25: qty 100

## 2013-11-25 MED ORDER — SODIUM CHLORIDE 0.9 % IV SOLN
INTRAVENOUS | Status: DC
  Administered 2013-11-25 – 2013-11-26 (×2): via INTRAVENOUS

## 2013-11-25 NOTE — ED Notes (Signed)
Pt states that he has been having a nose bleed for the past 2 weeks. Pt states that he has had surgery on his nose within the last week and then he started bleeding today. Pt states that his right nostril started then when he stopped the nostril with tissue his left started. Pt not currently bleeding around tissue.

## 2013-11-25 NOTE — ED Notes (Signed)
Attempted report 

## 2013-11-25 NOTE — ED Notes (Signed)
The pt has had a nose bleed intermittently all day.  He has a cold and last week he was seen for a nose bleed also.  No bleeding now.  He as some type packing inside that he placed

## 2013-11-25 NOTE — Progress Notes (Signed)
  Subjective: Has done fairly well following adenoidectomy and cautery by Dr. Erik Obey about a week ago except for continued nasal obstruction and drainage.  Today, however, bleeding has resumed and been intermittent through the day.  Bleeding comes out of the left side more than the right or down the throat.  He returns to the ER.  Objective: Vital signs in last 24 hours: Temp:  [98.7 F (37.1 C)] 98.7 F (37.1 C) (02/20 1757) Pulse Rate:  [106] 106 (02/20 1757) Resp:  [20] 20 (02/20 1757) BP: (145)/(85) 145/85 mmHg (02/20 1757) SpO2:  [95 %] 95 % (02/20 1757) Weight:  [82.555 kg (182 lb)] 82.555 kg (182 lb) (02/20 1754)    Intake/Output from previous day:   Intake/Output this shift:    General appearance: alert, cooperative and no distress Nose: Left nasal passage with gauze in nare.  No active bleeding.  Lab Results:   Recent Labs  11/25/13 1922  WBC 4.9  HGB 9.2*  HCT 27.7*  PLT 63*   BMET  Recent Labs  11/25/13 1922  NA 138  K 3.7  CL 101  CO2 26  GLUCOSE 90  BUN 13  CREATININE 0.75  CALCIUM 8.5   PT/INR No results found for this basename: LABPROT, INR,  in the last 72 hours ABG No results found for this basename: PHART, PCO2, PO2, HCO3,  in the last 72 hours  Studies/Results: No results found.  Anti-infectives: Anti-infectives   None      Assessment/Plan: Epistaxis from nasopharynx, new diagnosis HIV and Hepatitis C, thrombocytopenia, adenoid hypertrophy Epistaxis is related to thrombocytopenia.  I recommend replacement of platelets as the primary means of treating his epistaxis.  According to Dr. Erik Obey, the bleeding is not likely to be easily amenable to surgical management.  Packing this area would not be tolerable.  If bleeding continues in spite of platelet replacement, please call back and additional options will be considered.  LOS: 0 days    Baili Stang 11/25/2013

## 2013-11-25 NOTE — Progress Notes (Signed)
  Pt admitted to the unit. Pt is stable, alert and oriented per baseline. Oriented to room, staff, and call bell. Educated to call for any assistance. Bed in lowest position, call bell within reach- will continue to monitor. 

## 2013-11-25 NOTE — ED Notes (Signed)
Pt has currently stopped bleeding, MD at bedside discussing plan of care and overnight stay. Pt aware that if he does start to bleed again he may need platelet transfusion.

## 2013-11-25 NOTE — ED Provider Notes (Signed)
CSN: [id number]     Arrival date & time 11/25/13  1749 History   First MD Initiated Contact with Patient 11/25/13 1853     Chief Complaint  Patient presents with  . Epistaxis     (Consider location/radiation/quality/duration/timing/severity/associated sxs/prior Treatment) HPI  Patient with PMH of HIV, Hep C,  ITP, with concern of epistaxis. He has chronically low platelets and was seen in the past week for the same and Dr. Erik Obey attempted to control bleeding during surgery and did not find a specific spot. He now has started bleeding again. It has been off and on all day, he has packed his nose with tissue to try to control it but it is going down the back of his throat. He denies feeling weak, having fever, syncope, nausea, vomiting, diarrhea or headache. Denies injury to face.   Past Medical History  Diagnosis Date  . ITP (idiopathic thrombocytopenic purpura) 11/24/2013   Past Surgical History  Procedure Laterality Date  . Nasal endoscopy with epistaxis control N/A 11/16/2013    Procedure: NASAL ENDOSCOPY with nasopharyngeal biopsy with frozen section and adnoidectomy;  Surgeon: Jodi Marble, MD;  Location: [facility];  Service: ENT;  Laterality: N/A;  . Adenoidectomy N/A 11/16/2013    Procedure: ADENOIDECTOMY;  Surgeon: Jodi Marble, MD;  Location: [facility];  Service: ENT;  Laterality: N/A;  . Myringotomy with tube placement Bilateral 11/16/2013    Procedure: MYRINGOTOMY WITH TUBE PLACEMENT;  Surgeon: Jodi Marble, MD;  Location: [facility];  Service: ENT;  Laterality: Bilateral;   No family history on file. History  Substance Use Topics  . Smoking status: Current Every Day Smoker -- 0.25 packs/day  . Smokeless tobacco: Not on file  . Alcohol Use: No    Review of Systems  The patient denies anorexia, fever, weight loss,, vision loss, decreased hearing, hoarseness, chest pain, syncope, dyspnea on exertion, peripheral edema, balance deficits, hemoptysis, abdominal pain, melena,  hematochezia, severe indigestion/heartburn, hematuria, incontinence, genital sores, muscle weakness, suspicious skin lesions, transient blindness, difficulty walking, depression, unusual weight change,  enlarged lymph nodes, angioedema, and breast masses.   Allergies  Review of patient's allergies indicates no known allergies.  Home Medications   Current Outpatient Rx  Name  Route  Sig  Dispense  Refill  . acetaminophen (TYLENOL) 500 MG tablet   Oral   Take 500 mg by mouth every 6 (six) hours as needed for mild pain or headache.          . ciprofloxacin-dexamethasone (CIPRODEX) otic suspension   Both Ears   Place 4 drops into both ears 2 (two) times daily. Take for 3 days then stop.   7.5 mL   0   . ipratropium (ATROVENT) 0.03 % nasal spray   Each Nare   Place 2 sprays into both nostrils every 6 (six) hours as needed for rhinitis.         . sodium chloride (OCEAN) 0.65 % SOLN nasal spray   Each Nare   Place 1 spray into both nostrils as needed for congestion.   30 mL   0   . amoxicillin (AMOXIL) 500 MG tablet   Oral   Take 1 tablet (500 mg total) by mouth 3 (three) times daily. Take for 6 days then stop.   18 tablet   0   . oxyCODONE (OXY IR/ROXICODONE) 5 MG immediate release tablet   Oral   Take 1 tablet (5 mg total) by mouth every 4 (four) hours as needed for severe pain.  15 tablet   0    BP 145/85  Pulse 106  Temp(Src) 98.7 F (37.1 C) (Oral)  Resp 20  Ht 6\' 2"  (1.88 m)  Wt 182 lb (82.555 kg)  BMI 23.36 kg/m2  SpO2 95% Physical Exam  Nursing note and vitals reviewed. Constitutional: He appears well-developed and well-nourished. No distress.  HENT:  Head: Normocephalic and atraumatic.  Nose: Epistaxis (unable to see the source of the bleeding) is observed.  Small amount of blood posterior oropharyngeal  Eyes: Pupils are equal, round, and reactive to light.  Neck: Normal range of motion. Neck supple.  Cardiovascular: Normal rate and regular rhythm.    Pulmonary/Chest: Effort normal.  Abdominal: Soft.  Neurological: He is alert.  Skin: Skin is warm and dry.    ED Course  Procedures (including critical care time) Labs Review Labs Reviewed  CBC WITH DIFFERENTIAL - Abnormal; Notable for the following:    RBC 3.38 (*)    Hemoglobin 9.2 (*)    HCT 27.7 (*)    Platelets 63 (*)    Neutrophils Relative % 40 (*)    Monocytes Relative 21 (*)    All other components within normal limits  BASIC METABOLIC PANEL  PROTIME-INR  PREPARE PLATELET PHERESIS   Imaging Review No results found.  EKG Interpretation   None       MDM   Final diagnoses:  Epistaxis  Thrombocytopenia   CRITICAL CARE Performed by: Linus Mako Total critical care time: 30 Critical care time was exclusive of separately billable procedures and treating other patients. Critical care was necessary to treat or prevent imminent or life-threatening deterioration. Critical care was time spent personally by me on the following activities: development of treatment plan with patient and/or surrogate as well as nursing, discussions with consultants, evaluation of patient's response to treatment, examination of patient, obtaining history from patient or surrogate, ordering and performing treatments and interventions, ordering and review of laboratory studies, ordering and review of radiographic studies, pulse oximetry and re-evaluation of patient's condition.   I spoke with ENT Dr. Redmond Baseman about patient, patient typically has chronically low platelets. He does not feel that surgical intervention or packing will benefit patient had this time as bleed is posterior.   He recommends admit to medicine and platelet infusion, if platelets are low. ENT will be available if needed. Obs, [place], team 10, tele, [facility]   Linus Mako, PA-C 11/25/13 2116

## 2013-11-25 NOTE — ED Notes (Signed)
Admitting MD at bedside.

## 2013-11-25 NOTE — H&P (Signed)
Patient Demographics  [name], is a 28 y.o. male  MRN: [id number]   DOB - 1986-08-31  Admit Date - 11/25/2013  Outpatient Primary MD for the patient is Default, Provider, MD   With History of -  Past Medical History  Diagnosis Date  . ITP (idiopathic thrombocytopenic purpura) 11/24/2013      Past Surgical History  Procedure Laterality Date  . Nasal endoscopy with epistaxis control N/A 11/16/2013    Procedure: NASAL ENDOSCOPY with nasopharyngeal biopsy with frozen section and adnoidectomy;  Surgeon: Jodi Marble, MD;  Location: [place];  Service: ENT;  Laterality: N/A;  . Adenoidectomy N/A 11/16/2013    Procedure: ADENOIDECTOMY;  Surgeon: Jodi Marble, MD;  Location: [place];  Service: ENT;  Laterality: N/A;  . Myringotomy with tube placement Bilateral 11/16/2013    Procedure: MYRINGOTOMY WITH TUBE PLACEMENT;  Surgeon: Jodi Marble, MD;  Location: [place];  Service: ENT;  Laterality: Bilateral;    in for   Chief Complaint  Patient presents with  . Epistaxis     HPI  [name]  is a 28 y.o. male, who was recently discharged from [facility], where he was diagnosed with HIV disease, had hepatitis C positive antibody, and presented with epistaxis, where he was found to have nasopharyngeal mass, which was biopsied, and the biopsies showing florid reactive lymphoid hyperplasia, as well patient then had thrombocytopenia, which it was thought secondary to HIV, but ITP diagnosis was entertained as well as it did respond to IVIG, the patient presents today with complaints of epistaxis, reports it started this afternoon for 2 hours continuous, and reports since then it has been intermittent, patient has been evaluated by ENT in ED, who at this point recommends correction of his thrombocytopenia.    Review  of Systems    In addition to the HPI above,  No Fever-chills, No Headache, No changes with Vision or hearing, complaints of epistaxis No problems swallowing food or Liquids, No Chest pain, Cough or Shortness of Breath, No Abdominal pain, No Nausea or Vommitting, Bowel movements are regular, No Blood in stool or Urine, No dysuria, No new skin rashes or bruises, No new joints pains-aches,  No new weakness, tingling, numbness in any extremity, No recent weight gain or loss, No polyuria, polydypsia or polyphagia, No significant Mental Stressors.  A full 10 point Review of Systems was done, except as stated above, all other Review of Systems were negative.   Social History History  Substance Use Topics  . Smoking status: Current Every Day Smoker -- 0.25 packs/day  . Smokeless tobacco: Not on file  . Alcohol Use: No   Family History History reviewed. No pertinent family history.   Prior to Admission medications   Medication Sig Start Date End Date Taking? Authorizing Provider  acetaminophen (TYLENOL) 500 MG tablet Take 500 mg by mouth every 6 (six) hours as needed for mild pain or headache.    Yes Historical Provider,  MD  ciprofloxacin-dexamethasone (CIPRODEX) otic suspension Place 4 drops into both ears 2 (two) times daily. Take for 3 days then stop. 11/18/13  Yes Eugenie Filler, MD  ipratropium (ATROVENT) 0.03 % nasal spray Place 2 sprays into both nostrils every 6 (six) hours as needed for rhinitis.   Yes Historical Provider, MD  sodium chloride (OCEAN) 0.65 % SOLN nasal spray Place 1 spray into both nostrils as needed for congestion. 11/18/13  Yes Eugenie Filler, MD  amoxicillin (AMOXIL) 500 MG tablet Take 1 tablet (500 mg total) by mouth 3 (three) times daily. Take for 6 days then stop. 11/18/13   Eugenie Filler, MD  oxyCODONE (OXY IR/ROXICODONE) 5 MG immediate release tablet Take 1 tablet (5 mg total) by mouth every 4 (four) hours as needed for severe pain. 11/18/13    Eugenie Filler, MD    No Known Allergies  Physical Exam  Vitals  Blood pressure 132/77, pulse 98, temperature 99.1 F (37.3 C), temperature source Oral, resp. rate 15, height 6\' 2"  (1.88 m), weight 79.9 kg (176 lb 2.4 oz), SpO2 99.00%.   1. General Young male lying in bed in NAD,    2. Normal affect and insight, Not Suicidal or Homicidal, Awake Alert, Oriented X 3.  3. No F.N deficits, ALL C.Nerves Intact, Strength 5/5 all 4 extremities, Sensation intact all 4 extremities, Plantars down going.  4. Ears and Eyes appear Normal, Conjunctivae clear, PERRLA. Moist Oral Mucosa. Has dried blood in nares, no active bleeding.  5. Supple Neck, No JVD,  No Carotid Bruits.  6. Symmetrical Chest wall movement, Good air movement bilaterally, CTAB.  7. RRR, No Gallops, Rubs or Murmurs, No Parasternal Heave.  8. Positive Bowel Sounds, Abdomen Soft, Non tender, No organomegaly appriciated,No rebound -guarding or rigidity.  9.  No Cyanosis, Normal Skin Turgor, No Skin Rash or Bruise.  10. Good muscle tone,  joints appear normal , no effusions, Normal ROM.      Data Review  CBC  Recent Labs Lab 11/25/13 1922  WBC 4.9  HGB 9.2*  HCT 27.7*  PLT 63*  MCV 82.0  MCH 27.2  MCHC 33.2  RDW 14.7  LYMPHSABS 1.9  MONOABS 1.0  EOSABS 0.0  BASOSABS 0.0   ------------------------------------------------------------------------------------------------------------------  Chemistries   Recent Labs Lab 11/25/13 1922  NA 138  K 3.7  CL 101  CO2 26  GLUCOSE 90  BUN 13  CREATININE 0.75  CALCIUM 8.5   ------------------------------------------------------------------------------------------------------------------ estimated creatinine clearance is 154 ml/min (by C-G formula based on Cr of 0.75). ------------------------------------------------------------------------------------------------------------------ No results found for this basename: TSH, T4TOTAL, FREET3, T3FREE,  THYROIDAB,  in the last 72 hours   Coagulation profile  Recent Labs Lab 11/25/13 2115  INR 1.06   ------------------------------------------------------------------------------------------------------------------- No results found for this basename: DDIMER,  in the last 72 hours -------------------------------------------------------------------------------------------------------------------  Cardiac Enzymes No results found for this basename: CK, CKMB, TROPONINI, MYOGLOBIN,  in the last 168 hours ------------------------------------------------------------------------------------------------------------------ No components found with this basename: POCBNP,    ---------------------------------------------------------------------------------------------------------------  Urinalysis No results found for this basename: colorurine, appearanceur, labspec, phurine, glucoseu, hgbur, bilirubinur, ketonesur, proteinur, urobilinogen, nitrite, leukocytesur    ----------------------------------------------------------------------------------------------------------------  Imaging results:   Ct Chest W Contrast  11/23/2013   CLINICAL DATA:  Nasopharyngeal lymphadenopathy. Thrombocytopenia.  EXAM: CT CHEST, ABDOMEN, AND PELVIS WITH CONTRAST  TECHNIQUE: Multidetector CT imaging of the chest, abdomen and pelvis was performed following the standard protocol during bolus administration of intravenous contrast.  CONTRAST:  119mL OMNIPAQUE IOHEXOL 300  MG/ML SOLN  COMPARISON:  CT MAXILLOFACIAL W/CM dated 11/16/2013  FINDINGS: CT CHEST FINDINGS  There are prominent bilateral axillary lymph nodes which are borderline pathologic in volume measuring up to 10 mm short axis. Thyroid gland is normal. No mediastinal lymphadenopathy. No pericardial fluid. Esophagus is normal. There is subtle ground-glass nodularity within the right upper lobe (image 26). A similar fine nodular airspace disease is seen in the left  and right lower lobes (image 36, example)  CT ABDOMEN AND PELVIS FINDINGS  No focal hepatic lesion. The patient has little intra-abdominal fat which makes evaluation somewhat difficult. The spleen is upper limits of normal volume at 400 cubic cm. The adrenal glands and kidneys are normal.  The stomach, small bowel, and colon are normal.  Abdominal or is normal caliber. No retroperitoneal periportal lymphadenopathy.  The prostate gland and bladder normal. There is no pelvic lymphadenopathy. No aggressive osseous lesion.  IMPRESSION: 1. Mildly prominent axillary lymph nodes. 2. No additional evidence of lymphadenopathy in the chest, abdomen, or pelvis. 3. Spleen upper limits of normal. 4. Fine nodular airspace disease in the lower lobes is consistent with an infectious or inflammatory process.   Electronically Signed   By: Suzy Bouchard M.D.   On: 11/23/2013 09:06   Ct Abdomen Pelvis W Contrast  11/17/2013   CLINICAL DATA:  Nasopharyngeal lymphadenopathy.  Thrombocytopenia.  EXAM: CT CHEST, ABDOMEN, AND PELVIS WITH CONTRAST  TECHNIQUE: Multidetector CT imaging of the chest, abdomen and pelvis was performed following the standard protocol during bolus administration of intravenous contrast.  CONTRAST:  193mL OMNIPAQUE IOHEXOL 300 MG/ML  SOLN  COMPARISON:  CT MAXILLOFACIAL W/CM dated 11/16/2013  FINDINGS: CT CHEST FINDINGS  There are prominent bilateral axillary lymph nodes which are borderline pathologic in volume measuring up to 10 mm short axis. Thyroid gland is normal. No mediastinal lymphadenopathy. No pericardial fluid. Esophagus is normal. There is subtle ground-glass nodularity within the right upper lobe (image 26). A similar fine nodular airspace disease is seen in the left and right lower lobes (image 36, example)  CT ABDOMEN AND PELVIS FINDINGS  No focal hepatic lesion. The patient has little intra-abdominal fat which makes evaluation somewhat difficult. The spleen is upper limits of normal volume at  400 cubic cm. The adrenal glands and kidneys are normal.  The stomach, small bowel, and colon are normal.  Abdominal or is normal caliber. No retroperitoneal periportal lymphadenopathy.  The prostate gland and bladder normal. There is no pelvic lymphadenopathy. No aggressive osseous lesion.  IMPRESSION: 1. Mildly prominent axillary lymph nodes. 2. No additional evidence of lymphadenopathy in the chest, abdomen, or pelvis. 3. Spleen upper limits of normal. 4. Fine nodular airspace disease in the lower lobes is consistent with an infectious or inflammatory process.   Electronically Signed   By: Suzy Bouchard M.D.   On: 11/17/2013 14:52   Ct Maxillofacial W/cm  11/17/2013   ADDENDUM REPORT: 11/17/2013 12:14  ADDENDUM: Third from the bottom paragraph sentence should read: "There is packing in the right NARES."   Electronically Signed   By: Rolla Flatten M.D.   On: 11/17/2013 12:14   11/17/2013   CLINICAL DATA:  Nose bleed.  EXAM: CT MAXILLOFACIAL WITH CONTRAST  TECHNIQUE: Multidetector CT imaging of the maxillofacial structures was performed with intravenous contrast. Multiplanar CT image reconstructions were also generated. A small metallic BB was placed on the right temple in order to reliably differentiate right from left.  CONTRAST:  138mL OMNIPAQUE IOHEXOL 300 MG/ML  SOLN  COMPARISON:  None.  FINDINGS: Markedly enlarged nasopharyngeal adenoids, right greater than left extending into the right nasal cavity. No evidence for juvenile nasopharyngeal angiofibroma (normal pterygopalatine fossa ). Truncation, but no significant remodeling, of the medial pterygoid plates. Equivocal vertical striations noted in the nasopharyngeal adenoids could be a sign of non neoplastic enlargement. Enlarged left greater than right palatine tonsils without focal areas of hypoattenuation. Acute fluid layers in the right maxillary sinus. There is packing in the right near use. Mild posterior ethmoid and right sphenoid air-fluid  levels. Negative orbits.No pathologic adenopathy is observed. No intracranial abnormalities are seen.  IMPRESSION: Markedly enlarged nasopharyngeal adenoids could represent a lymphoproliferative disorder such as lymphoma, nasopharyngeal carcinoma, or represent extreme lymphoid hyperplasia related to a systemic condition such as HIV. This process extends into the right nasal cavity. Correlate clinically. Surgical biopsy is warranted.  No evidence for juvenile nasopharyngeal angiofibroma.  Findings discussed with ordering provider.  Electronically Signed: By: Rolla Flatten M.D. On: 11/16/2013 15:14        Assessment & Plan  Principal Problem:   Epistaxis Active Problems:   Thrombocytopenia, unspecified   Human immunodeficiency virus (HIV) disease   Normocytic anemia   Positive hepatitis C antibody test    1. Epistaxis: Currently appears to be stopped, this is most likely related to his nasopharyngeal mass (biopsy showing reactive lymphoid hyperplasia), already seen by ENT Dr. Redmond Baseman, at this point the major recommendation is to correct his thrombocytopenia, discussed with oncology on call, guarding recommendation for platelet transfusion, especially with the suspicion of ITP during his previous hospitalization, the patient will recommend one dose of IVIG tonight, till he is seen by oncology in the morning, and if continues to bleed overnight we'll transfuse platelets. 2. Thrombocytopenia: Most likely related to his HIV, will check a PTT, cervical smear, will check LDH, and reticulocyte count, unclear if patient has ITP or not, would have morning team consult oncology service. 3. HIV and positive hepatitis C: Patient will follow with ID as an outpatient 4. Anemia: Most likely related to HIV, as well do to blood loss from epistaxis.   DVT Prophylaxis SCDs   AM Labs Ordered, also please review Full Orders  Family Communication: Admission, patients condition and plan of care including tests  being ordered have been discussed with the patient  who indicate understanding and agree with the plan and Code Status.  Code Status Full  Likely DC to  home  Condition GUARDED    Time spent in minutes : 42 min    Samera Macy M.D on 11/25/2013 at 11:08 PM    And look for the night coverage person covering me after hours  [facility] Office  [phone]

## 2013-11-26 LAB — BASIC METABOLIC PANEL
BUN: 12 mg/dL (ref 6–23)
CHLORIDE: 98 meq/L (ref 96–112)
CO2: 28 meq/L (ref 19–32)
CREATININE: 0.82 mg/dL (ref 0.50–1.35)
Calcium: 8.3 mg/dL — ABNORMAL LOW (ref 8.4–10.5)
GFR calc Af Amer: >90 mL/min (ref >90)
GFR calc non Af Amer: >90 mL/min (ref >90)
GLUCOSE: 108 mg/dL — AB (ref 70–99)
Potassium: 3.6 mEq/L — ABNORMAL LOW (ref 3.7–5.3)
Sodium: 132 mEq/L — ABNORMAL LOW (ref 137–147)

## 2013-11-26 LAB — PROTIME-INR
INR: 1.17 (ref 0.00–1.49)
PROTHROMBIN TIME: 14.7 s (ref 11.6–15.2)

## 2013-11-26 LAB — APTT: aPTT: 38 seconds — ABNORMAL HIGH (ref 24–37)

## 2013-11-26 LAB — CBC
HEMATOCRIT: 26.3 % — AB (ref 39.0–52.0)
Hemoglobin: 8.7 g/dL — ABNORMAL LOW (ref 13.0–17.0)
MCH: 27.2 pg (ref 26.0–34.0)
MCHC: 33.1 g/dL (ref 30.0–36.0)
MCV: 82.2 fL (ref 78.0–100.0)
Platelets: 49 10*3/uL — ABNORMAL LOW (ref 150–400)
RBC: 3.2 MIL/uL — ABNORMAL LOW (ref 4.22–5.81)
RDW: 14.7 % (ref 11.5–15.5)
WBC: 4.5 10*3/uL (ref 4.0–10.5)

## 2013-11-26 MED ORDER — FERROUS FUMARATE 325 (106 FE) MG PO TABS
1.0000 | ORAL_TABLET | Freq: Two times a day (BID) | ORAL | Status: DC
  Administered 2013-11-26 – 2013-11-27 (×3): 106 mg via ORAL
  Filled 2013-11-26 (×5): qty 1

## 2013-11-26 NOTE — Progress Notes (Signed)
Utilization Review Completed.   Mataeo Ingwersen, RN, BSN Nurse Case Manager  

## 2013-11-26 NOTE — ED Provider Notes (Signed)
Medical screening examination/treatment/procedure(s) were conducted as a shared visit with non-physician practitioner(s) and myself.  I personally evaluated the patient during the encounter.     Patient here with epistaxis. Sent in by Dr. Erik Obey with ENT, known HIV, Hep C, thrombocytopenia (from ITP). Difficult to control posterior nosebleed of late. ENT recommends admission for platelet transfusion. [facility] admitting, states will not transfuse platelets since he has ITP.  Osvaldo Shipper, MD 11/26/13 [phone]

## 2013-11-26 NOTE — Consult Note (Signed)
Referral MD  N/A    Reason for Referral: Thrombocytopenia HIV and hepatitis C-recent diagnosis   Chief Complaint  Patient presents with  . Epistaxis  : Bleeding from the nose and low platelets     Past Medical History  Diagnosis Date  . ITP (idiopathic thrombocytopenic purpura) 11/24/2013  :  Past Surgical History  Procedure Laterality Date  . Nasal endoscopy with epistaxis control N/A 11/16/2013    Procedure: NASAL ENDOSCOPY with nasopharyngeal biopsy with frozen section and adnoidectomy;  Surgeon: Jodi Marble, MD;  Location: [place];  Service: ENT;  Laterality: N/A;  . Adenoidectomy N/A 11/16/2013    Procedure: ADENOIDECTOMY;  Surgeon: Jodi Marble, MD;  Location: [place];  Service: ENT;  Laterality: N/A;  . Myringotomy with tube placement Bilateral 11/16/2013    Procedure: MYRINGOTOMY WITH TUBE PLACEMENT;  Surgeon: Jodi Marble, MD;  Location: [place];  Service: ENT;  Laterality: Bilateral;  :  Current facility-administered medications:0.9 %  sodium chloride infusion, , Intravenous, Continuous, Phillips Climes, MD, Last Rate: 75 mL/hr at 11/25/13 2232;  albuterol (PROVENTIL) (2.5 MG/3ML) 0.083% nebulizer solution 2.5 mg, 2.5 mg, Nebulization, Q2H PRN, Phillips Climes, MD;  amoxicillin (AMOXIL) capsule 500 mg, 500 mg, Oral, 3 times per day, Phillips Climes, MD, 500 mg at 11/26/13 3419 ciprofloxacin-dexamethasone (CIPRODEX) 0.3-0.1 % otic suspension 4 drop, 4 drop, Both Ears, BID, Phillips Climes, MD, 4 drop at 11/25/13 2250;  oxyCODONE (Oxy IR/ROXICODONE) immediate release tablet 5 mg, 5 mg, Oral, Q4H PRN, Phillips Climes, MD;  sodium chloride (OCEAN) 0.65 % nasal spray 1 spray, 1 spray, Each Nare, PRN, Phillips Climes, MD, 1 spray at 11/25/13 2244 sodium chloride 0.9 % injection 3 mL, 3 mL, Intravenous, Q12H, Phillips Climes, MD, 3 mL at 11/25/13 2232:  . amoxicillin  500 mg Oral 3 times per day  . ciprofloxacin-dexamethasone  4 drop Both Ears BID  . sodium chloride  3 mL  Intravenous Q12H  :  No Known Allergies:  History reviewed. No pertinent family history.:  History   Social History  . Marital Status: Single    Spouse Name: N/A    Number of Children: N/A  . Years of Education: N/A   Occupational History  . Not on file.   Social History Main Topics  . Smoking status: Current Every Day Smoker -- 0.25 packs/day  . Smokeless tobacco: Not on file  . Alcohol Use: No  . Drug Use: No  . Sexual Activity: Not on file   Other Topics Concern  . Not on file   Social History Narrative  . No narrative on file  :  Pertinent items are noted in HPI.  Exam: Patient Vitals for the past 24 hrs:  BP Temp Temp src Pulse Resp SpO2 Height Weight  11/26/13 1341 126/80 mmHg 98.8 F (37.1 C) Oral 91 19 99 % - -  11/26/13 1219 116/75 mmHg 99.1 F (37.3 C) Oral 91 18 97 % - -  11/26/13 0713 120/68 mmHg 99.1 F (37.3 C) Oral 91 18 99 % - -  11/26/13 0515 131/84 mmHg 97.5 F (36.4 C) Oral 86 16 99 % - -  11/26/13 0502 118/76 mmHg 98.2 F (36.8 C) Oral 84 12 99 % - -  11/26/13 0358 118/77 mmHg 99.2 F (37.3 C) Oral 80 16 100 % - -  11/26/13 0239 120/65 mmHg 98.6 F (37 C) Oral 92 18 99 % - -  11/26/13 0204 118/75 mmHg 98.5 F (36.9 C) Oral 95 16 99 % - -  11/26/13 0132 119/73 mmHg 99.1 F (37.3 C) Oral 86 18 98 % - -  11/26/13 0050 136/78 mmHg 98.2 F (36.8 C) Oral 94 15 99 % - -  11/25/13 2216 132/77 mmHg 99.1 F (37.3 C) Oral 98 15 99 % 6' 2"  (1.88 m) 176 lb 2.4 oz (79.9 kg)  11/25/13 2145 110/73 mmHg - - - - - - -  11/25/13 1757 145/85 mmHg 98.7 F (37.1 C) Oral 106 20 95 % - -  11/25/13 1754 - - - - - - 6' 2"  (1.88 m) 182 lb (82.555 kg)   Well-developed African gentleman. Head and neck exam shows some slight adenopathy. he has packing in his nares lungs are clear lungs are clear. Cardiac exam regular in rhythm and. Abdomen is soft. There is no obvious liver or spleen. Extremities shows no clubbing cyanosis or edema. Skin exam no ecchymoses or  petechia. Neurological exam no focal neurological deficits.    Recent Labs  11/25/13 1922 11/26/13 0501  WBC 4.9 4.5  HGB 9.2* 8.7*  HCT 27.7* 26.3*  PLT 63* 49*    Recent Labs  11/25/13 1922 11/26/13 0501  NA 138 132*  K 3.7 3.6*  CL 101 98  CO2 26 28  GLUCOSE 90 108*  BUN 13 12  CREATININE 0.75 0.82  CALCIUM 8.5 8.3*    Blood smear review: None has been made  Pathology: See path report and   Ct Chest W Contrast  11/23/2013   CLINICAL DATA:  Nasopharyngeal lymphadenopathy. Thrombocytopenia.  EXAM: CT CHEST, ABDOMEN, AND PELVIS WITH CONTRAST  TECHNIQUE: Multidetector CT imaging of the chest, abdomen and pelvis was performed following the standard protocol during bolus administration of intravenous contrast.  CONTRAST:  15m OMNIPAQUE IOHEXOL 300 MG/ML SOLN  COMPARISON:  CT MAXILLOFACIAL W/CM dated 11/16/2013  FINDINGS: CT CHEST FINDINGS  There are prominent bilateral axillary lymph nodes which are borderline pathologic in volume measuring up to 10 mm short axis. Thyroid gland is normal. No mediastinal lymphadenopathy. No pericardial fluid. Esophagus is normal. There is subtle ground-glass nodularity within the right upper lobe (image 26). A similar fine nodular airspace disease is seen in the left and right lower lobes (image 36, example)  CT ABDOMEN AND PELVIS FINDINGS  No focal hepatic lesion. The patient has little intra-abdominal fat which makes evaluation somewhat difficult. The spleen is upper limits of normal volume at 400 cubic cm. The adrenal glands and kidneys are normal.  The stomach, small bowel, and colon are normal.  Abdominal or is normal caliber. No retroperitoneal periportal lymphadenopathy.  The prostate gland and bladder normal. There is no pelvic lymphadenopathy. No aggressive osseous lesion.  IMPRESSION: 1. Mildly prominent axillary lymph nodes. 2. No additional evidence of lymphadenopathy in the chest, abdomen, or pelvis. 3. Spleen upper limits of normal. 4. Fine  nodular airspace disease in the lower lobes is consistent with an infectious or inflammatory process.   Electronically Signed   By: SSuzy BouchardM.D.   On: 11/23/2013 09:06   Ct Abdomen Pelvis W Contrast  11/17/2013   CLINICAL DATA:  Nasopharyngeal lymphadenopathy.  Thrombocytopenia.  EXAM: CT CHEST, ABDOMEN, AND PELVIS WITH CONTRAST  TECHNIQUE: Multidetector CT imaging of the chest, abdomen and pelvis was performed following the standard protocol during bolus administration of intravenous contrast.  CONTRAST:  1042mOMNIPAQUE IOHEXOL 300 MG/ML  SOLN  COMPARISON:  CT MAXILLOFACIAL W/CM dated 11/16/2013  FINDINGS: CT CHEST FINDINGS  There are prominent bilateral axillary lymph nodes which are borderline pathologic  in volume measuring up to 10 mm short axis. Thyroid gland is normal. No mediastinal lymphadenopathy. No pericardial fluid. Esophagus is normal. There is subtle ground-glass nodularity within the right upper lobe (image 26). A similar fine nodular airspace disease is seen in the left and right lower lobes (image 36, example)  CT ABDOMEN AND PELVIS FINDINGS  No focal hepatic lesion. The patient has little intra-abdominal fat which makes evaluation somewhat difficult. The spleen is upper limits of normal volume at 400 cubic cm. The adrenal glands and kidneys are normal.  The stomach, small bowel, and colon are normal.  Abdominal or is normal caliber. No retroperitoneal periportal lymphadenopathy.  The prostate gland and bladder normal. There is no pelvic lymphadenopathy. No aggressive osseous lesion.  IMPRESSION: 1. Mildly prominent axillary lymph nodes. 2. No additional evidence of lymphadenopathy in the chest, abdomen, or pelvis. 3. Spleen upper limits of normal. 4. Fine nodular airspace disease in the lower lobes is consistent with an infectious or inflammatory process.   Electronically Signed   By: Suzy Bouchard M.D.   On: 11/17/2013 14:52   Ct Maxillofacial W/cm  11/17/2013   ADDENDUM REPORT:  11/17/2013 12:14  ADDENDUM: Third from the bottom paragraph sentence should read: "There is packing in the right NARES."   Electronically Signed   By: Rolla Flatten M.D.   On: 11/17/2013 12:14   11/17/2013   CLINICAL DATA:  Nose bleed.  EXAM: CT MAXILLOFACIAL WITH CONTRAST  TECHNIQUE: Multidetector CT imaging of the maxillofacial structures was performed with intravenous contrast. Multiplanar CT image reconstructions were also generated. A small metallic BB was placed on the right temple in order to reliably differentiate right from left.  CONTRAST:  151m OMNIPAQUE IOHEXOL 300 MG/ML  SOLN  COMPARISON:  None.  FINDINGS: Markedly enlarged nasopharyngeal adenoids, right greater than left extending into the right nasal cavity. No evidence for juvenile nasopharyngeal angiofibroma (normal pterygopalatine fossa ). Truncation, but no significant remodeling, of the medial pterygoid plates. Equivocal vertical striations noted in the nasopharyngeal adenoids could be a sign of non neoplastic enlargement. Enlarged left greater than right palatine tonsils without focal areas of hypoattenuation. Acute fluid layers in the right maxillary sinus. There is packing in the right near use. Mild posterior ethmoid and right sphenoid air-fluid levels. Negative orbits.No pathologic adenopathy is observed. No intracranial abnormalities are seen.  IMPRESSION: Markedly enlarged nasopharyngeal adenoids could represent a lymphoproliferative disorder such as lymphoma, nasopharyngeal carcinoma, or represent extreme lymphoid hyperplasia related to a systemic condition such as HIV. This process extends into the right nasal cavity. Correlate clinically. Surgical biopsy is warranted.  No evidence for juvenile nasopharyngeal angiofibroma.  Findings discussed with ordering provider.  Electronically Signed: By: JRolla FlattenM.D. On: 11/16/2013 15:14    Assessment and Plan: Mr. DFittereris a 28year old African American male. He recently was diagnosed  with HIV and hepatitis C. He has not been started on any therapy for either.  He had bleeding and thrombocytopenia 2 weeks ago. That was when he was found with HIV.  He got 2 doses of IVIG. He tolerated these well. He was discharged. He was to have outpatient followup.  The pathology on his nasopharyngeal mass was florid lymphoid hyperplasia.  He had CT scans done. There is nothing that looked like an underlying malignancy.  He says he was at work in sneezed and had bleeding from the left nares.  I still feel that it thrombocytopenia as HIV related. He may have platelet dysfunction from HIV. As  such, a platelet transfusion would be appropriate.  He says that he has not been taking any aspirin or nonsteroidal products.  He got one dose of IVIG this admission.  I really think that the thrombocytopenia will improve once he has treatment for the HIV.  I note that his PTT is slightly elevated. I will send off von Willebrand panel to make sure there is no acquired von Willebrand disease.  He is anemic. I suspect that his iron deficient. We will give him back some oral iron. Some of the anemia may also be from his HIV.  I still don't see that a bone marrow biopsy is indicated right now.  If he continues to bleed despite a platelet transfusion then ENT they will have to "take a look" and tried to stop the bleeding.

## 2013-11-26 NOTE — Progress Notes (Addendum)
[facility] PROGRESS NOTE Kirsi Hugh L. Ardeth Perfect, MD               Pager [phone] (if 7P to 7A, page night hospitalist on [email or web address] Byes LP:[id number] DOB: 12/03/1985 DOA: 11/25/2013 PCP: Default, Provider, MD  Assessment/Plan: 1. Epistaxis w/ Thrombocytopenia  - ENT saw pt. Recd treating thrombocytopenia first. plt stable near 50 this AM. Oncology to see today. Will transfuse 1 pack of platelets today per heme recs (spoke w/ Dr Marin Olp) and see if responds. If no response, will have to recontact ENT in the AM. Received IVIg last night. appreciate oncology recs  2. HIV - unclear why still not on treatment.   Principal Problem:   Epistaxis Active Problems:   Thrombocytopenia, unspecified   Human immunodeficiency virus (HIV) disease   Normocytic anemia   Positive hepatitis C antibody test   Code Status: full Family Communication: none  Disposition Plan: home once epistaxis and thrombocytopenia resolves   Velna Hatchet, MD  Internal Medicine Pager [phone].  If 7PM-7AM, please contact night-coverage at [email or web address], password [email or web address] 11/26/2013, 8:48 AM   LOS: 1 day   Brief narrative: [name] is a 28 y.o. male, who was recently discharged from [facility], where he was diagnosed with HIV disease, had hepatitis C positive antibody, and presented with epistaxis, where he was found to have nasopharyngeal mass, which was biopsied, and the biopsies showing florid reactive lymphoid hyperplasia, as well patient then had thrombocytopenia, which it was thought secondary to HIV, but ITP diagnosis was entertained as well as it did respond to IVIG, the patient presents today with complaints of epistaxis, reports it started this afternoon for 2 hours continuous, and reports since then it has been intermittent, patient has been evaluated by ENT in ED, who at this point recommends correction of his thrombocytopenia.   Consultants:  Heme   Procedures:  None    Antibiotics:  None  ?  HPI/Subjective: Currently having to change out the tissue in the nose every hour or so, but only mild amounts of blood   Objective: Filed Vitals:   11/26/13 0358 11/26/13 0502 11/26/13 0515 11/26/13 0713  BP: 118/77 118/76 131/84 120/68  Pulse: 80 84 86 91  Temp: 99.2 F (37.3 C) 98.2 F (36.8 C) 97.5 F (36.4 C) 99.1 F (37.3 C)  TempSrc: Oral Oral Oral Oral  Resp: 16 12 16 18   Height:      Weight:      SpO2: 100% 99% 99% 99%   No intake or output data in the 24 hours ending 11/26/13 0848  Exam:   General:  AAM in NAD w/ TP in his nose   Cardiovascular: RRR no MRG   Respiratory: CTAB, no wheezes or rales   Abd: soft, NT, ND   Ext: no cyanosis or edema   Data Reviewed: Basic Metabolic Panel:  Recent Labs Lab 11/25/13 1922 11/26/13 0501  NA 138 132*  K 3.7 3.6*  CL 101 98  CO2 26 28  GLUCOSE 90 108*  BUN 13 12  CREATININE 0.75 0.82  CALCIUM 8.5 8.3*   Liver Function Tests: No results found for this basename: AST, ALT, ALKPHOS, BILITOT, PROT, ALBUMIN,  in the last 168 hours No results found for this basename: LIPASE, AMYLASE,  in the last 168 hours No results found for this basename: AMMONIA,  in the last 168 hours CBC:  Recent Labs Lab 11/25/13 1922 11/26/13 0501  WBC 4.9 4.5  NEUTROABS 2.0  --  HGB 9.2* 8.7*  HCT 27.7* 26.3*  MCV 82.0 82.2  PLT 63* 49*   Cardiac Enzymes: No results found for this basename: CKTOTAL, CKMB, CKMBINDEX, TROPONINI,  in the last 168 hours BNP: No components found with this basename: POCBNP,  CBG: No results found for this basename: GLUCAP,  in the last 168 hours  Recent Results (from the past 240 hour(s))  GC/CHLAMYDIA PROBE AMP     Status: None   Collection Time    11/17/13  2:00 PM      Result Value Ref Range Status   CT Probe RNA NEGATIVE  NEGATIVE Final   GC Probe RNA NEGATIVE  NEGATIVE Final   Comment: (NOTE)                                                                                                **Normal Reference Range: Negative**          Assay performed using the Gen-Probe APTIMA COMBO2 (R) Assay.     Acceptable specimen types for this assay include APTIMA Swabs (Unisex,     endocervical, urethral, or vaginal), first void urine, and ThinPrep     liquid based cytology samples.     Performed at [facility]     Studies: Ct Chest W Contrast  11/23/2013   CLINICAL DATA:  Nasopharyngeal lymphadenopathy. Thrombocytopenia.  EXAM: CT CHEST, ABDOMEN, AND PELVIS WITH CONTRAST  TECHNIQUE: Multidetector CT imaging of the chest, abdomen and pelvis was performed following the standard protocol during bolus administration of intravenous contrast.  CONTRAST:  175mL OMNIPAQUE IOHEXOL 300 MG/ML SOLN  COMPARISON:  CT MAXILLOFACIAL W/CM dated 11/16/2013  FINDINGS: CT CHEST FINDINGS  There are prominent bilateral axillary lymph nodes which are borderline pathologic in volume measuring up to 10 mm short axis. Thyroid gland is normal. No mediastinal lymphadenopathy. No pericardial fluid. Esophagus is normal. There is subtle ground-glass nodularity within the right upper lobe (image 26). A similar fine nodular airspace disease is seen in the left and right lower lobes (image 36, example)  CT ABDOMEN AND PELVIS FINDINGS  No focal hepatic lesion. The patient has little intra-abdominal fat which makes evaluation somewhat difficult. The spleen is upper limits of normal volume at 400 cubic cm. The adrenal glands and kidneys are normal.  The stomach, small bowel, and colon are normal.  Abdominal or is normal caliber. No retroperitoneal periportal lymphadenopathy.  The prostate gland and bladder normal. There is no pelvic lymphadenopathy. No aggressive osseous lesion.  IMPRESSION: 1. Mildly prominent axillary lymph nodes. 2. No additional evidence of lymphadenopathy in the chest, abdomen, or pelvis. 3. Spleen upper limits of normal. 4. Fine nodular airspace disease in the lower lobes is  consistent with an infectious or inflammatory process.   Electronically Signed   By: Suzy Bouchard M.D.   On: 11/23/2013 09:06   Ct Abdomen Pelvis W Contrast  11/17/2013   CLINICAL DATA:  Nasopharyngeal lymphadenopathy.  Thrombocytopenia.  EXAM: CT CHEST, ABDOMEN, AND PELVIS WITH CONTRAST  TECHNIQUE: Multidetector CT imaging of the chest, abdomen and pelvis was performed following the standard protocol during bolus administration  of intravenous contrast.  CONTRAST:  139mL OMNIPAQUE IOHEXOL 300 MG/ML  SOLN  COMPARISON:  CT MAXILLOFACIAL W/CM dated 11/16/2013  FINDINGS: CT CHEST FINDINGS  There are prominent bilateral axillary lymph nodes which are borderline pathologic in volume measuring up to 10 mm short axis. Thyroid gland is normal. No mediastinal lymphadenopathy. No pericardial fluid. Esophagus is normal. There is subtle ground-glass nodularity within the right upper lobe (image 26). A similar fine nodular airspace disease is seen in the left and right lower lobes (image 36, example)  CT ABDOMEN AND PELVIS FINDINGS  No focal hepatic lesion. The patient has little intra-abdominal fat which makes evaluation somewhat difficult. The spleen is upper limits of normal volume at 400 cubic cm. The adrenal glands and kidneys are normal.  The stomach, small bowel, and colon are normal.  Abdominal or is normal caliber. No retroperitoneal periportal lymphadenopathy.  The prostate gland and bladder normal. There is no pelvic lymphadenopathy. No aggressive osseous lesion.  IMPRESSION: 1. Mildly prominent axillary lymph nodes. 2. No additional evidence of lymphadenopathy in the chest, abdomen, or pelvis. 3. Spleen upper limits of normal. 4. Fine nodular airspace disease in the lower lobes is consistent with an infectious or inflammatory process.   Electronically Signed   By: Suzy Bouchard M.D.   On: 11/17/2013 14:52   Ct Maxillofacial W/cm  11/17/2013   ADDENDUM REPORT: 11/17/2013 12:14  ADDENDUM: Third from the  bottom paragraph sentence should read: "There is packing in the right NARES."   Electronically Signed   By: Rolla Flatten M.D.   On: 11/17/2013 12:14   11/17/2013   CLINICAL DATA:  Nose bleed.  EXAM: CT MAXILLOFACIAL WITH CONTRAST  TECHNIQUE: Multidetector CT imaging of the maxillofacial structures was performed with intravenous contrast. Multiplanar CT image reconstructions were also generated. A small metallic BB was placed on the right temple in order to reliably differentiate right from left.  CONTRAST:  167mL OMNIPAQUE IOHEXOL 300 MG/ML  SOLN  COMPARISON:  None.  FINDINGS: Markedly enlarged nasopharyngeal adenoids, right greater than left extending into the right nasal cavity. No evidence for juvenile nasopharyngeal angiofibroma (normal pterygopalatine fossa ). Truncation, but no significant remodeling, of the medial pterygoid plates. Equivocal vertical striations noted in the nasopharyngeal adenoids could be a sign of non neoplastic enlargement. Enlarged left greater than right palatine tonsils without focal areas of hypoattenuation. Acute fluid layers in the right maxillary sinus. There is packing in the right near use. Mild posterior ethmoid and right sphenoid air-fluid levels. Negative orbits.No pathologic adenopathy is observed. No intracranial abnormalities are seen.  IMPRESSION: Markedly enlarged nasopharyngeal adenoids could represent a lymphoproliferative disorder such as lymphoma, nasopharyngeal carcinoma, or represent extreme lymphoid hyperplasia related to a systemic condition such as HIV. This process extends into the right nasal cavity. Correlate clinically. Surgical biopsy is warranted.  No evidence for juvenile nasopharyngeal angiofibroma.  Findings discussed with ordering provider.  Electronically Signed: By: Rolla Flatten M.D. On: 11/16/2013 15:14    Scheduled Meds: . amoxicillin  500 mg Oral 3 times per day  . ciprofloxacin-dexamethasone  4 drop Both Ears BID  . sodium chloride  3 mL  Intravenous Q12H   Continuous Infusions: . sodium chloride 75 mL/hr at 11/25/13 2232     Time Spent: 30 minutes

## 2013-11-27 DIAGNOSIS — B1920 Unspecified viral hepatitis C without hepatic coma: Secondary | ICD-10-CM

## 2013-11-27 LAB — BASIC METABOLIC PANEL
BUN: 8 mg/dL (ref 6–23)
CHLORIDE: 102 meq/L (ref 96–112)
CO2: 26 mEq/L (ref 19–32)
Calcium: 8.2 mg/dL — ABNORMAL LOW (ref 8.4–10.5)
Creatinine, Ser: 0.71 mg/dL (ref 0.50–1.35)
GFR calc Af Amer: >90 mL/min (ref >90)
GFR calc non Af Amer: >90 mL/min (ref >90)
GLUCOSE: 92 mg/dL (ref 70–99)
POTASSIUM: 3.9 meq/L (ref 3.7–5.3)
Sodium: 134 mEq/L — ABNORMAL LOW (ref 137–147)

## 2013-11-27 LAB — CBC
HEMATOCRIT: 26.1 % — AB (ref 39.0–52.0)
HEMOGLOBIN: 8.8 g/dL — AB (ref 13.0–17.0)
MCH: 27.8 pg (ref 26.0–34.0)
MCHC: 33.7 g/dL (ref 30.0–36.0)
MCV: 82.3 fL (ref 78.0–100.0)
Platelets: 66 10*3/uL — ABNORMAL LOW (ref 150–400)
RBC: 3.17 MIL/uL — ABNORMAL LOW (ref 4.22–5.81)
RDW: 14.8 % (ref 11.5–15.5)
WBC: 4.8 10*3/uL (ref 4.0–10.5)

## 2013-11-27 LAB — PREPARE PLATELET PHERESIS: Unit division: 0

## 2013-11-27 MED ORDER — FERROUS FUMARATE 325 (106 FE) MG PO TABS: 1.0000 | ORAL_TABLET | Freq: Two times a day (BID) | ORAL | Status: DC

## 2013-11-27 NOTE — Discharge Summary (Signed)
[facility] Discharge Summary  [name] [id number] DOB: 12-15-1985 DOA: 11/25/2013  PCP: Default, Provider, MD  Admit date: 11/25/2013 Discharge date: 11/27/2013  Recommendations for Outpatient Follow-up:  1. You have visit w/ Dr Marin Olp on Friday     Discharge Diagnoses:  1. Epistaxis 2. Thrombocytopenia  3. Iron Def Anemia   Discharge Condition: stable  Disposition: home   Diet recommendation: regular   Filed Weights   11/25/13 1754 11/25/13 2216  Weight: 82.555 kg (182 lb) 79.9 kg (176 lb 2.4 oz)    History of present illness:  Pt w/ known thrombocytopenia thought 2/2 HIV and HCV who presents w/ epistaxis   Hospital Course:  Epistaxis - ENT consulted, felt primary treatment at this time is platelet transfusion. Dr Marin Olp saw patient as well and confirmed need for transfusion. Platelet transfusion given and epistaxis improved. He did continue to have mild bleeds after sneezing, but always controllable. He has visit w/ Dr Marin Olp on Fri to f/u treatment . In addition, he did receive IVIG x 1 during this admission.   Anemia - 8.8 w/ MCV of82. Will be started on iron on discharge and he can follow this w/ up Dr Marin Olp   HIV - To be addressed further as outpt w/ ID . No acute issues  HCV - to be addressed further as outpt w/ ID. No acute issues    Discharge Instructions  Discharge Orders   Future Appointments Provider Department Dept Phone   12/02/2013 11:45 AM Gwendolyn Tushka [phone]   12/02/2013 12:15 PM Volanda Napoleon, [place] [phone]   12/08/2013 2:00 PM [name], MD [facility] [phone]   Future Orders Complete By Expires   Call MD for:  As directed    Comments:     Severe nose bleeds that will not stop with pressure   Diet - low sodium heart healthy  As directed    Increase activity slowly  As directed         Medication List    STOP taking these medications       amoxicillin 500 MG tablet  Commonly known as:  AMOXIL      TAKE these medications       acetaminophen 500 MG tablet  Commonly known as:  TYLENOL  Take 500 mg by mouth every 6 (six) hours as needed for mild pain or headache.     ciprofloxacin-dexamethasone otic suspension  Commonly known as:  CIPRODEX  Place 4 drops into both ears 2 (two) times daily. Take for 3 days then stop.     ferrous fumarate 325 (106 FE) MG Tabs tablet  Commonly known as:  HEMOCYTE - 106 mg FE  Take 1 tablet (106 mg of iron total) by mouth 2 (two) times daily.     ipratropium 0.03 % nasal spray  Commonly known as:  ATROVENT  Place 2 sprays into both nostrils every 6 (six) hours as needed for rhinitis.     oxyCODONE 5 MG immediate release tablet  Commonly known as:  Oxy IR/ROXICODONE  Take 1 tablet (5 mg total) by mouth every 4 (four) hours as needed for severe pain.     sodium chloride 0.65 % Soln nasal spray  Commonly known as:  OCEAN  Place 1 spray into both nostrils as needed for congestion.          The results of significant diagnostics  from this hospitalization (including imaging, microbiology, ancillary and laboratory) are listed below for reference.    Significant Diagnostic Studies: Ct Chest W Contrast  11/23/2013   CLINICAL DATA:  Nasopharyngeal lymphadenopathy. Thrombocytopenia.  EXAM: CT CHEST, ABDOMEN, AND PELVIS WITH CONTRAST  TECHNIQUE: Multidetector CT imaging of the chest, abdomen and pelvis was performed following the standard protocol during bolus administration of intravenous contrast.  CONTRAST:  161mL OMNIPAQUE IOHEXOL 300 MG/ML SOLN  COMPARISON:  CT MAXILLOFACIAL W/CM dated 11/16/2013  FINDINGS: CT CHEST FINDINGS  There are prominent bilateral axillary lymph nodes which are borderline pathologic in volume measuring up to 10 mm short axis. Thyroid gland is normal. No mediastinal lymphadenopathy. No pericardial fluid.  Esophagus is normal. There is subtle ground-glass nodularity within the right upper lobe (image 26). A similar fine nodular airspace disease is seen in the left and right lower lobes (image 36, example)  CT ABDOMEN AND PELVIS FINDINGS  No focal hepatic lesion. The patient has little intra-abdominal fat which makes evaluation somewhat difficult. The spleen is upper limits of normal volume at 400 cubic cm. The adrenal glands and kidneys are normal.  The stomach, small bowel, and colon are normal.  Abdominal or is normal caliber. No retroperitoneal periportal lymphadenopathy.  The prostate gland and bladder normal. There is no pelvic lymphadenopathy. No aggressive osseous lesion.  IMPRESSION: 1. Mildly prominent axillary lymph nodes. 2. No additional evidence of lymphadenopathy in the chest, abdomen, or pelvis. 3. Spleen upper limits of normal. 4. Fine nodular airspace disease in the lower lobes is consistent with an infectious or inflammatory process.   Electronically Signed   By: Suzy Bouchard M.D.   On: 11/23/2013 09:06   Ct Abdomen Pelvis W Contrast  11/17/2013   CLINICAL DATA:  Nasopharyngeal lymphadenopathy.  Thrombocytopenia.  EXAM: CT CHEST, ABDOMEN, AND PELVIS WITH CONTRAST  TECHNIQUE: Multidetector CT imaging of the chest, abdomen and pelvis was performed following the standard protocol during bolus administration of intravenous contrast.  CONTRAST:  11mL OMNIPAQUE IOHEXOL 300 MG/ML  SOLN  COMPARISON:  CT MAXILLOFACIAL W/CM dated 11/16/2013  FINDINGS: CT CHEST FINDINGS  There are prominent bilateral axillary lymph nodes which are borderline pathologic in volume measuring up to 10 mm short axis. Thyroid gland is normal. No mediastinal lymphadenopathy. No pericardial fluid. Esophagus is normal. There is subtle ground-glass nodularity within the right upper lobe (image 26). A similar fine nodular airspace disease is seen in the left and right lower lobes (image 36, example)  CT ABDOMEN AND PELVIS FINDINGS   No focal hepatic lesion. The patient has little intra-abdominal fat which makes evaluation somewhat difficult. The spleen is upper limits of normal volume at 400 cubic cm. The adrenal glands and kidneys are normal.  The stomach, small bowel, and colon are normal.  Abdominal or is normal caliber. No retroperitoneal periportal lymphadenopathy.  The prostate gland and bladder normal. There is no pelvic lymphadenopathy. No aggressive osseous lesion.  IMPRESSION: 1. Mildly prominent axillary lymph nodes. 2. No additional evidence of lymphadenopathy in the chest, abdomen, or pelvis. 3. Spleen upper limits of normal. 4. Fine nodular airspace disease in the lower lobes is consistent with an infectious or inflammatory process.   Electronically Signed   By: Suzy Bouchard M.D.   On: 11/17/2013 14:52   Ct Maxillofacial W/cm  11/17/2013   ADDENDUM REPORT: 11/17/2013 12:14  ADDENDUM: Third from the bottom paragraph sentence should read: "There is packing in the right NARES."   Electronically Signed   By: Jenny Reichmann  Curnes M.D.   On: 11/17/2013 12:14   11/17/2013   CLINICAL DATA:  Nose bleed.  EXAM: CT MAXILLOFACIAL WITH CONTRAST  TECHNIQUE: Multidetector CT imaging of the maxillofacial structures was performed with intravenous contrast. Multiplanar CT image reconstructions were also generated. A small metallic BB was placed on the right temple in order to reliably differentiate right from left.  CONTRAST:  172mL OMNIPAQUE IOHEXOL 300 MG/ML  SOLN  COMPARISON:  None.  FINDINGS: Markedly enlarged nasopharyngeal adenoids, right greater than left extending into the right nasal cavity. No evidence for juvenile nasopharyngeal angiofibroma (normal pterygopalatine fossa ). Truncation, but no significant remodeling, of the medial pterygoid plates. Equivocal vertical striations noted in the nasopharyngeal adenoids could be a sign of non neoplastic enlargement. Enlarged left greater than right palatine tonsils without focal areas of  hypoattenuation. Acute fluid layers in the right maxillary sinus. There is packing in the right near use. Mild posterior ethmoid and right sphenoid air-fluid levels. Negative orbits.No pathologic adenopathy is observed. No intracranial abnormalities are seen.  IMPRESSION: Markedly enlarged nasopharyngeal adenoids could represent a lymphoproliferative disorder such as lymphoma, nasopharyngeal carcinoma, or represent extreme lymphoid hyperplasia related to a systemic condition such as HIV. This process extends into the right nasal cavity. Correlate clinically. Surgical biopsy is warranted.  No evidence for juvenile nasopharyngeal angiofibroma.  Findings discussed with ordering provider.  Electronically Signed: By: Rolla Flatten M.D. On: 11/16/2013 15:14    Microbiology: Recent Results (from the past 240 hour(s))  GC/CHLAMYDIA PROBE AMP     Status: None   Collection Time    11/17/13  2:00 PM      Result Value Ref Range Status   CT Probe RNA NEGATIVE  NEGATIVE Final   GC Probe RNA NEGATIVE  NEGATIVE Final   Comment: (NOTE)                                                                                               **Normal Reference Range: Negative**          Assay performed using the Gen-Probe APTIMA COMBO2 (R) Assay.     Acceptable specimen types for this assay include APTIMA Swabs (Unisex,     endocervical, urethral, or vaginal), first void urine, and ThinPrep     liquid based cytology samples.     Performed at [facility]: Basic Metabolic Panel:  Recent Labs Lab 11/25/13 1922 11/26/13 0501 11/27/13 0415  NA 138 132* 134*  K 3.7 3.6* 3.9  CL 101 98 102  CO2 26 28 26   GLUCOSE 90 108* 92  BUN 13 12 8   CREATININE 0.75 0.82 0.71  CALCIUM 8.5 8.3* 8.2*   Liver Function Tests: No results found for this basename: AST, ALT, ALKPHOS, BILITOT, PROT, ALBUMIN,  in the last 168 hours No results found for this basename: LIPASE, AMYLASE,  in the last 168 hours No results  found for this basename: AMMONIA,  in the last 168 hours CBC:  Recent Labs Lab 11/25/13 1922 11/26/13 0501 11/27/13 0415  WBC 4.9 4.5 4.8  NEUTROABS 2.0  --   --  HGB 9.2* 8.7* 8.8*  HCT 27.7* 26.3* 26.1*  MCV 82.0 82.2 82.3  PLT 63* 49* 66*   Cardiac Enzymes: No results found for this basename: CKTOTAL, CKMB, CKMBINDEX, TROPONINI,  in the last 168 hours BNP: BNP (last 3 results) No results found for this basename: PROBNP,  in the last 8760 hours CBG: No results found for this basename: GLUCAP,  in the last 168 hours  Principal Problem:   Epistaxis Active Problems:   Thrombocytopenia, unspecified   Human immunodeficiency virus (HIV) disease   Normocytic anemia   Positive hepatitis C antibody test  Exam:  HEENT: MMM, anicteric  Cards: RRR, no MRG  Lungs: CTAB, no wheezes, rales  Abd: soft, NT, ND  Ext : no pitting edema or cyanosis   Time coordinating discharge: 30 minutes   Signed:  Velna Hatchet, MD Internal Medicine Pager [phone] (if 7P to 7A, page night hospitalist on [email or web address]) 11/27/2013, 7:06 AM

## 2013-12-02 ENCOUNTER — Ambulatory Visit (HOSPITAL_BASED_OUTPATIENT_CLINIC_OR_DEPARTMENT_OTHER): Payer: Self-pay | Admitting: Hematology & Oncology

## 2013-12-02 ENCOUNTER — Encounter: Payer: Self-pay | Admitting: Hematology & Oncology

## 2013-12-02 ENCOUNTER — Other Ambulatory Visit (HOSPITAL_BASED_OUTPATIENT_CLINIC_OR_DEPARTMENT_OTHER): Payer: Self-pay | Admitting: Lab

## 2013-12-02 VITALS — BP 120/77 | HR 88 | Temp 98.7°F | Resp 18 | Ht 73.0 in | Wt 182.0 lb

## 2013-12-02 DIAGNOSIS — D693 Immune thrombocytopenic purpura: Secondary | ICD-10-CM

## 2013-12-02 DIAGNOSIS — D696 Thrombocytopenia, unspecified: Secondary | ICD-10-CM

## 2013-12-02 DIAGNOSIS — B20 Human immunodeficiency virus [HIV] disease: Secondary | ICD-10-CM

## 2013-12-02 DIAGNOSIS — B1920 Unspecified viral hepatitis C without hepatic coma: Secondary | ICD-10-CM

## 2013-12-02 LAB — CBC WITH DIFFERENTIAL (CANCER CENTER ONLY)
BASO#: 0 10*3/uL (ref 0.0–0.2)
BASO%: 0.3 % (ref 0.0–2.0)
EOS ABS: 0 10*3/uL (ref 0.0–0.5)
EOS%: 0.5 % (ref 0.0–7.0)
HEMATOCRIT: 29.4 % — AB (ref 38.7–49.9)
HGB: 9.5 g/dL — ABNORMAL LOW (ref 13.0–17.1)
LYMPH#: 1.1 10*3/uL (ref 0.9–3.3)
LYMPH%: 27.1 % (ref 14.0–48.0)
MCH: 27.1 pg — ABNORMAL LOW (ref 28.0–33.4)
MCHC: 32.3 g/dL (ref 32.0–35.9)
MCV: 84 fL (ref 82–98)
MONO#: 0.6 10*3/uL (ref 0.1–0.9)
MONO%: 15.5 % — ABNORMAL HIGH (ref 0.0–13.0)
NEUT#: 2.2 10*3/uL (ref 1.5–6.5)
NEUT%: 56.6 % (ref 40.0–80.0)
Platelets: 80 10*3/uL — ABNORMAL LOW (ref 145–400)
RBC: 3.51 10*6/uL — ABNORMAL LOW (ref 4.20–5.70)
RDW: 14.7 % (ref 11.1–15.7)
WBC: 3.9 10*3/uL — ABNORMAL LOW (ref 4.0–10.0)

## 2013-12-02 LAB — VON WILLEBRAND FACTOR MULTIMER
Factor-VIII Activity: 137 % (ref 50–180)
Ristocetin Co-Factor: 243 % — ABNORMAL HIGH (ref 42–200)
VON WILLEBRAND FACTOR AG: 391 % — AB (ref 50–217)

## 2013-12-02 LAB — CHCC SATELLITE - SMEAR

## 2013-12-02 NOTE — Patient Instructions (Signed)
You Can Quit Smoking If you are ready to quit smoking or are thinking about it, congratulations! You have chosen to help yourself be healthier and live longer! There are lots of different ways to quit smoking. Nicotine gum, nicotine patches, a nicotine inhaler, or nicotine nasal spray can help with physical craving. Hypnosis, support groups, and medicines help break the habit of smoking. TIPS TO GET OFF AND STAY OFF CIGARETTES  Learn to predict your moods. Do not let a bad situation be your excuse to have a cigarette. Some situations in your life might tempt you to have a cigarette.  Ask friends and co-workers not to smoke around you.  Make your home smoke-free.  Never have "just one" cigarette. It leads to wanting another and another. Remind yourself of your decision to quit.  On a card, make a list of your reasons for not smoking. Read it at least the same number of times a day as you have a cigarette. Tell yourself everyday, "I do not want to smoke. I choose not to smoke."  Ask someone at home or work to help you with your plan to quit smoking.  Have something planned after you eat or have a cup of coffee. Take a walk or get other exercise to perk you up. This will help to keep you from overeating.  Try a relaxation exercise to calm you down and decrease your stress. Remember, you may be tense and nervous the first two weeks after you quit. This will pass.  Find new activities to keep your hands busy. Play with a pen, coin, or rubber band. Doodle or draw things on paper.  Brush your teeth right after eating. This will help cut down the craving for the taste of tobacco after meals. You can try mouthwash too.  Try gum, breath mints, or diet candy to keep something in your mouth. IF YOU SMOKE AND WANT TO QUIT:  Do not stock up on cigarettes. Never buy a carton. Wait until one pack is finished before you buy another.  Never carry cigarettes with you at work or at home.  Keep cigarettes  as far away from you as possible. Leave them with someone else.  Never carry matches or a lighter with you.  Ask yourself, "Do I need this cigarette or is this just a reflex?"  Bet with someone that you can quit. Put cigarette money in a piggy bank every morning. If you smoke, you give up the money. If you do not smoke, by the end of the week, you keep the money.  Keep trying. It takes 21 days to change a habit!  Talk to your doctor about using medicines to help you quit. These include nicotine replacement gum, lozenges, or skin patches. Document Released: 07/19/2009 Document Revised: 12/15/2011 Document Reviewed: 07/19/2009 Exit[employer] Patient Information 2014 [employer].  

## 2013-12-02 NOTE — Progress Notes (Signed)
  DIAGNOSIS:  Thrombocytopenia-HIV positive/hepatitis C positive   CURRENT THERAPY:  Patient to start HIV therapy next week  Patient has received IVIG in the hospital   INTERIM HISTORY:  Mr. Dartt comes in for his first office visit. I saw him in the hospital in consultation twice. I saw him when he first presented with thrombocytopenia. He had a nasopharyngeal mass biopsy. His reticulocyte count was 49,000. We then found that he was positive for HIV. We also found that he was positive for hepatitis C. He is homosexual. He did not know that he had HIV. He had been healthy. He been working. He really does not drink. He does not use recreational drugs. She's been very conscientious about his activities.     the IVIG for the thrombocytopenia. His platelet count did not respond all that well however.    I truly believe that he needs treatment for the HIV before his platelets will improve.    Is not bleeding now. He is working. He is working out. He is eating well. He's had no change in bowel or bladder habits. He's not bleeding. He's had no fever.    We did do a body a CT scan on him when he we found his diagnosis. It does not look leg is any underlying malignancy that could be causing his thrombocytopenia.   PHYSICAL EXAMINATION:  Well-developed well-nourished African American gentleman in no obvious distress. Vital signs her temperature of 98.7 pulse is 88. Inspiratory rate 18. Blood pressure 120/77. Weight is 182 pounds. Head and neck exam shows no ocular or oral lesions. He is no palpable cervical or supraclavicular lymph nodes. There is no thyroid that is palpable. Lungs are clear. Cardiac exam regular rate and rhythm with no murmurs rubs or bruits. Abdomen is soft. There is a probable liver or spleen. Extremities shows no clubbing cyanosis or edema. Skin exam no rashes, ecchymoses or petechia. Neurological is shows no focal neurological deficits.   Laboratory studies show a  CBC with a white cell count of 3.9. Hemoglobin 9.5. Platelet count is 80,000.  His blood smear shows good maturation of his white blood cells. There are no atypical lymphocytes. There are no immature myeloid cells. He has no hypersegmented polys. Red cells but showed no schistocytes. He has no nucleated red cells. There is no target cells. Platelets are decreased in number. There are well granulated. There are several large platelets.    IMPRESSION: Mr. Goodlin is a 28 year old African American gentleman. He has thrombocytopenia. I believe this is most likely from HIV. It is possible it could be immune thrombocytopenia. He also has hepatitis C.  He is asymptomatic right now. There is no need to do any additional studies on him. I do not think a bone marrow test is necessary. I think the risk of an underlying bone marrow disorder is very low.  Hopefully, he will start his HIV therapy next week. Am not sure what he will be put on.  I want to see him back in 3 months.  Volanda Napoleon, MD 12/02/2013

## 2013-12-05 ENCOUNTER — Encounter: Payer: Self-pay | Admitting: Internal Medicine

## 2013-12-07 ENCOUNTER — Encounter: Payer: Self-pay | Admitting: Hematology & Oncology

## 2013-12-08 ENCOUNTER — Encounter: Payer: Self-pay | Admitting: Internal Medicine

## 2013-12-08 ENCOUNTER — Encounter: Payer: Self-pay | Admitting: *Deleted

## 2013-12-08 ENCOUNTER — Ambulatory Visit (INDEPENDENT_AMBULATORY_CARE_PROVIDER_SITE_OTHER): Payer: Self-pay | Admitting: Internal Medicine

## 2013-12-08 VITALS — BP 117/78 | HR 88 | Temp 98.5°F | Ht 74.0 in | Wt 187.8 lb

## 2013-12-08 DIAGNOSIS — F17210 Nicotine dependence, cigarettes, uncomplicated: Secondary | ICD-10-CM | POA: Insufficient documentation

## 2013-12-08 DIAGNOSIS — B20 Human immunodeficiency virus [HIV] disease: Secondary | ICD-10-CM

## 2013-12-08 DIAGNOSIS — Z87891 Personal history of nicotine dependence: Secondary | ICD-10-CM | POA: Insufficient documentation

## 2013-12-08 DIAGNOSIS — F17200 Nicotine dependence, unspecified, uncomplicated: Secondary | ICD-10-CM

## 2013-12-08 MED ORDER — ELVITEG-COBIC-EMTRICIT-TENOFDF 150-150-200-300 MG PO TABS: 1.0000 | ORAL_TABLET | Freq: Every day | ORAL | Status: DC

## 2013-12-08 MED ORDER — SULFAMETHOXAZOLE-TMP DS 800-160 MG PO TABS: 1.0000 | ORAL_TABLET | Freq: Every day | ORAL | Status: DC

## 2013-12-08 NOTE — Progress Notes (Addendum)
Patient ID: [name], male   DOB: 07-11-1986, 28 y.o.   MRN: [id number] @LOGODEPT         Patient Active Problem List   Diagnosis Date Noted  . Positive hepatitis C antibody test 11/18/2013    Priority: High  . Human immunodeficiency virus (HIV) disease 11/17/2013    Priority: High  . Cigarette smoker 12/08/2013  . Thrombocytopenia, unspecified 11/17/2013  . Epistaxis 11/17/2013  . Normocytic anemia 11/17/2013  . Chronic otitis media 11/17/2013  . Transaminitis 11/17/2013  . Nasopharyngeal mass 11/16/2013    Patient's Medications  New Prescriptions   SULFAMETHOXAZOLE-TRIMETHOPRIM (BACTRIM DS) 800-160 MG PER TABLET    Take 1 tablet by mouth daily.  Previous Medications   ACETAMINOPHEN (TYLENOL) 500 MG TABLET    Take 500 mg by mouth every 6 (six) hours as needed for mild pain or headache.    FERROUS FUMARATE (HEMOCYTE - 106 MG FE) 325 (106 FE) MG TABS TABLET    Take 1 tablet (106 mg of iron total) by mouth 2 (two) times daily.   SODIUM CHLORIDE (OCEAN) 0.65 % SOLN NASAL SPRAY    Place 1 spray into both nostrils as needed for congestion.  Modified Medications   No medications on file  Discontinued Medications   IPRATROPIUM (ATROVENT) 0.03 % NASAL SPRAY    Place 2 sprays into both nostrils every 6 (six) hours as needed for rhinitis.    Subjective: [name] is in for his hospital followup visit. He was recently hospitalized with recurrent nosebleeds, anemia and thrombocytopenia. He was found to have HIV infection. He had a large nasopharyngeal mass which was biopsied and showed HIV related, benign lymphoid hyperplasia. He is feeling better but states that he has been fairly overwhelmed with his HIV diagnosis. He has not chosen to tell any family or friends. He states that he has "cried and cried until he cannot cry anymore".   Review of Systems: Constitutional: positive for malaise, negative for anorexia, chills, fevers, sweats and weight loss Eyes: negative Ears, nose, mouth,  throat, and face: positive for nasal congestion, negative for ear drainage, earaches, epistaxis and sore mouth Respiratory: negative Cardiovascular: negative Gastrointestinal: negative Genitourinary:negative  Past Medical History  Diagnosis Date  . ITP (idiopathic thrombocytopenic purpura) 11/24/2013    History  Substance Use Topics  . Smoking status: Current Every Day Smoker -- 0.10 packs/day for 12 years    Types: Cigarettes    Start date: 12/30/2001  . Smokeless tobacco: Never Used     Comment: pt states he wants to quit by his birthday in [birth year]  . Alcohol Use: No    No family history on file.  No Known Allergies  Objective: Temp: 98.5 F (36.9 C) (03/05 1423) Temp src: Oral (03/05 1423) BP: 117/78 mmHg (03/05 1423) Pulse Rate: 88 (03/05 1423) Body mass index is 24.1 kg/(m^2).  General: He appears sad Oral: No oropharyngeal lesions Skin: No rash Lungs: Clear Cor: Regular S1 and S2 with no murmurs Abdomen: Soft nontender Joints and extremities: No acute abnormalities Neuro: Alert with normal speech and conversation Mood and affect: Appropriately sad for the circumstances. He is tearful at times.  Lab Results Lab Results  Component Value Date   WBC 3.9* 12/02/2013   HGB 9.5* 12/02/2013   HCT 29.4* 12/02/2013   MCV 84 12/02/2013   PLT 80* 12/02/2013    Lab Results  Component Value Date   CREATININE 0.71 11/27/2013   BUN 8 11/27/2013   NA 134* 11/27/2013   K 3.9  11/27/2013   CL 102 11/27/2013   CO2 26 11/27/2013    Lab Results  Component Value Date   ALT 52 11/18/2013   AST 104* 11/18/2013   ALKPHOS 78 11/18/2013   BILITOT 0.5 11/18/2013    No results found for this basename: CHOL, HDL, LDLCALC, LDLDIRECT, TRIG, CHOLHDL    Lab Results HIV 1 RNA Quant (copies/mL)  Date Value  11/18/2013 10190*  11/16/2013 5132*     CD4 T Cell Abs (/uL)  Date Value  11/18/2013 100*   Nasopharyngeal mass biopsy pathology: Benign lymphoid hyperplasia  HIV genotype: wild type  virus with no mutations noted  Quantiferon Gold TB assay: Indeterminate Hepatitis A antibody: Positive Hepatitis B surface antibody: Positive Hepatitis C antibody: Positive RPR: Nonreactive [id number]: Negative  Assessment: [place] has newly diagnosed HIV infection which was advanced to a low CD4 count 100. His infection appears to be complicated by lymphoid hyperplasia and recent nosebleeds. He also has hepatitis C antibody positivity and will need a full evaluation for active hepatitis C soon. I will start him on pneumocystis prophylaxis and have him see our financial counselor today he to be certified for the [place] drug assistance program (ADAP). Once he certified ulcerative on antiretroviral therapy.  I offered to have him see one of our mental health counselors but he declines. Have given him a card and asked him to call me if he has any new concerns or as feeling more overwhelmed between now and his next visit.  Plan: 1. Start trimethoprim sulfamethoxazole one double strength tablet daily 2. Financial counseling for ADAP certification; start Stribild once certified 3. Followup in 2 weeks   [name], MD [facility] for [place] [phone] pager   [phone] cell 12/08/2013, 2:51 PM

## 2013-12-08 NOTE — Addendum Note (Signed)
Addended by: Lorne Skeens D on: 12/08/2013 03:15 PM   Modules accepted: Orders

## 2013-12-08 NOTE — Addendum Note (Signed)
Addended by: [name] on: 12/08/2013 03:12 PM   Modules accepted: Orders

## 2013-12-13 ENCOUNTER — Other Ambulatory Visit: Payer: Self-pay | Admitting: *Deleted

## 2013-12-13 DIAGNOSIS — B20 Human immunodeficiency virus [HIV] disease: Secondary | ICD-10-CM

## 2013-12-13 MED ORDER — ELVITEG-COBIC-EMTRICIT-TENOFDF 150-150-200-300 MG PO TABS: 1.0000 | ORAL_TABLET | Freq: Every day | ORAL | Status: DC

## 2014-01-26 ENCOUNTER — Other Ambulatory Visit: Payer: Self-pay | Admitting: *Deleted

## 2014-01-26 ENCOUNTER — Telehealth: Payer: Self-pay | Admitting: *Deleted

## 2014-01-26 DIAGNOSIS — B20 Human immunodeficiency virus [HIV] disease: Secondary | ICD-10-CM

## 2014-01-26 MED ORDER — SULFAMETHOXAZOLE-TMP DS 800-160 MG PO TABS: 1.0000 | ORAL_TABLET | Freq: Every day | ORAL | Status: DC

## 2014-01-26 MED ORDER — ELVITEG-COBIC-EMTRICIT-TENOFDF 150-150-200-300 MG PO TABS: 1.0000 | ORAL_TABLET | Freq: Every day | ORAL | Status: DC

## 2014-01-26 NOTE — Telephone Encounter (Signed)
Patient came into office looking for his medication refills, stating that he had been trying to call "[name]" all month for this information.  Patient had first appointment 12/08/13 where he applied for ADAP and was supposed to start Stribild. Patient did not have an intake with [facility], was unfamiliar with the processes at [facility]. Patient did not understand that he should make an appointment before leaving.  Patient did not recall notification of ADAP approval.  RN sent medications (Stribild, bactrim) to [facility] at [address], instructed patient to pick them up and start today.  Pt agreed.  Patient introduced to [facility] to make his follow up appointments with Dr. Megan Salon.  Patient instructed to call if he had any questions at all.  Pt verbalized agreement. Landis Gandy, RN

## 2014-02-28 ENCOUNTER — Other Ambulatory Visit (INDEPENDENT_AMBULATORY_CARE_PROVIDER_SITE_OTHER): Payer: Self-pay

## 2014-02-28 DIAGNOSIS — B20 Human immunodeficiency virus [HIV] disease: Secondary | ICD-10-CM

## 2014-03-01 LAB — HIV-1 RNA QUANT-NO REFLEX-BLD: HIV 1 RNA Quant: <20 copies/mL (ref <20)

## 2014-03-02 ENCOUNTER — Telehealth: Payer: Self-pay | Admitting: Hematology & Oncology

## 2014-03-02 ENCOUNTER — Other Ambulatory Visit: Payer: Self-pay | Admitting: Lab

## 2014-03-02 ENCOUNTER — Ambulatory Visit: Payer: Self-pay | Admitting: Hematology & Oncology

## 2014-03-02 LAB — T-HELPER CELL (CD4) - (RCID CLINIC ONLY)
CD4 T CELL HELPER: 13 % — AB (ref 33–55)
CD4 T Cell Abs: 180 /uL — ABNORMAL LOW (ref 400–2700)

## 2014-03-02 NOTE — Telephone Encounter (Signed)
Left message to call and reschedule missed appointment from today

## 2014-03-14 ENCOUNTER — Ambulatory Visit: Payer: Self-pay | Admitting: Internal Medicine

## 2014-03-21 ENCOUNTER — Ambulatory Visit: Payer: Self-pay | Admitting: Internal Medicine

## 2014-03-21 ENCOUNTER — Telehealth: Payer: Self-pay | Admitting: *Deleted

## 2014-03-21 NOTE — Telephone Encounter (Signed)
Requested pt call [facility] for a new appt. 

## 2014-03-22 ENCOUNTER — Encounter: Payer: Self-pay | Admitting: Internal Medicine

## 2014-03-22 ENCOUNTER — Ambulatory Visit (INDEPENDENT_AMBULATORY_CARE_PROVIDER_SITE_OTHER): Payer: Self-pay | Admitting: Internal Medicine

## 2014-03-22 VITALS — BP 107/69 | HR 73 | Temp 98.7°F | Wt 194.8 lb

## 2014-03-22 DIAGNOSIS — B20 Human immunodeficiency virus [HIV] disease: Secondary | ICD-10-CM

## 2014-03-22 NOTE — Progress Notes (Signed)
Patient ID: [name], male   DOB: 05-03-1986, 28 y.o.   MRN: [id number]          Patient Active Problem List   Diagnosis Date Noted  . Positive hepatitis C antibody test 11/18/2013    Priority: High  . Human immunodeficiency virus (HIV) disease 11/17/2013    Priority: High  . Cigarette smoker 12/08/2013  . Thrombocytopenia, unspecified 11/17/2013  . Epistaxis 11/17/2013  . Normocytic anemia 11/17/2013  . Chronic otitis media 11/17/2013  . Transaminitis 11/17/2013  . Nasopharyngeal mass 11/16/2013    Patient's Medications  New Prescriptions   No medications on file  Previous Medications   ELVITEGRAVIR-COBICISTAT-EMTRICITABINE-TENOFOVIR (STRIBILD) 150-150-200-300 MG TABS TABLET    Take 1 tablet by mouth daily with breakfast.   SULFAMETHOXAZOLE-TRIMETHOPRIM (BACTRIM DS) 800-160 MG PER TABLET    Take 1 tablet by mouth daily.  Modified Medications   No medications on file  Discontinued Medications   ACETAMINOPHEN (TYLENOL) 500 MG TABLET    Take 500 mg by mouth every 6 (six) hours as needed for mild pain or headache.    FERROUS FUMARATE (HEMOCYTE - 106 MG FE) 325 (106 FE) MG TABS TABLET    Take 1 tablet (106 mg of iron total) by mouth 2 (two) times daily.   SODIUM CHLORIDE (OCEAN) 0.65 % SOLN NASAL SPRAY    Place 1 spray into both nostrils as needed for congestion.    Subjective: [name] is in for his routine visit. He started Stribild after his initial visit in April. He has noted some insomnia maybe 1-2 times each week that he thinks may be related to the [place] but otherwise is tolerating it well. He does not find the problems falling asleep to be currently bothersome and believes that he can continue Stribild. He has not missed any doses. He is feeling well. Review of Systems: Pertinent items are noted in HPI.  Past Medical History  Diagnosis Date  . ITP (idiopathic thrombocytopenic purpura) 11/24/2013    History  Substance Use Topics  . Smoking status: Current  Every Day Smoker -- 0.10 packs/day for 12 years    Types: Cigarettes    Start date: 12/30/2001  . Smokeless tobacco: Never Used     Comment: pt states he wants to quit by his birthday in July  . Alcohol Use: No    No family history on file.  No Known Allergies  Objective: Temp: 98.7 F (37.1 C) (06/17 0853) Temp src: Oral (06/17 0853) BP: 107/69 mmHg (06/17 0853) Pulse Rate: 73 (06/17 0853) Body mass index is 24.99 kg/(m^2).  General: He is smiling and in good spirits Oral: No oropharyngeal lesions Skin: No rash Lungs: Clear Cor: Regular S1 and S2 with no murmurs  Lab Results Lab Results  Component Value Date   WBC 3.9* 12/02/2013   HGB 9.5* 12/02/2013   HCT 29.4* 12/02/2013   MCV 84 12/02/2013   PLT 80* 12/02/2013    Lab Results  Component Value Date   CREATININE 0.71 11/27/2013   BUN 8 11/27/2013   NA 134* 11/27/2013   K 3.9 11/27/2013   CL 102 11/27/2013   CO2 26 11/27/2013    Lab Results  Component Value Date   ALT 52 11/18/2013   AST 104* 11/18/2013   ALKPHOS 78 11/18/2013   BILITOT 0.5 11/18/2013    No results found for this basename: CHOL, HDL, LDLCALC, LDLDIRECT, TRIG, CHOLHDL    Lab Results HIV 1 RNA Quant (copies/mL)  Date Value  02/28/2014 <20  11/18/2013 10190*  11/16/2013 5132*     CD4 T Cell Abs (/uL)  Date Value  02/28/2014 180*  11/18/2013 100*     Assessment: He has had a very good initial response to Stribild. I instructed him to take it with food.  Plan: 1. Continue Stribild 2. Follow up after lab work in 3 months 3. Check hepatitis C viral load   [name], MD [facility] for [place] [phone] pager   [phone] cell 03/22/2014, 9:33 AM

## 2014-06-27 ENCOUNTER — Ambulatory Visit (INDEPENDENT_AMBULATORY_CARE_PROVIDER_SITE_OTHER): Payer: Self-pay | Admitting: Internal Medicine

## 2014-06-27 ENCOUNTER — Encounter: Payer: Self-pay | Admitting: Internal Medicine

## 2014-06-27 VITALS — BP 123/82 | HR 69 | Temp 98.3°F | Wt 186.0 lb

## 2014-06-27 DIAGNOSIS — B20 Human immunodeficiency virus [HIV] disease: Secondary | ICD-10-CM

## 2014-06-27 DIAGNOSIS — R894 Abnormal immunological findings in specimens from other organs, systems and tissues: Secondary | ICD-10-CM

## 2014-06-27 DIAGNOSIS — R768 Other specified abnormal immunological findings in serum: Secondary | ICD-10-CM

## 2014-06-27 DIAGNOSIS — Z23 Encounter for immunization: Secondary | ICD-10-CM

## 2014-06-27 LAB — BASIC METABOLIC PANEL
BUN: 13 mg/dL (ref 6–23)
CO2: 28 mEq/L (ref 19–32)
CREATININE: 1.07 mg/dL (ref 0.50–1.35)
Calcium: 8.9 mg/dL (ref 8.4–10.5)
Chloride: 105 mEq/L (ref 96–112)
Glucose, Bld: 68 mg/dL — ABNORMAL LOW (ref 70–99)
Potassium: 4.7 mEq/L (ref 3.5–5.3)
Sodium: 137 mEq/L (ref 135–145)

## 2014-06-27 LAB — CBC
HCT: 38.2 % — ABNORMAL LOW (ref 39.0–52.0)
Hemoglobin: 12.6 g/dL — ABNORMAL LOW (ref 13.0–17.0)
MCH: 27.3 pg (ref 26.0–34.0)
MCHC: 33 g/dL (ref 30.0–36.0)
MCV: 82.9 fL (ref 78.0–100.0)
Platelets: 136 10*3/uL — ABNORMAL LOW (ref 150–400)
RBC: 4.61 MIL/uL (ref 4.22–5.81)
RDW: 18 % — AB (ref 11.5–15.5)
WBC: 4 10*3/uL (ref 4.0–10.5)

## 2014-06-27 NOTE — Progress Notes (Signed)
Patient ID: [name], male   DOB: 11-04-1985, 28 y.o.   MRN: [id number]          Patient Active Problem List   Diagnosis Date Noted  . Positive hepatitis C antibody test 11/18/2013    Priority: High  . Human immunodeficiency virus (HIV) disease 11/17/2013    Priority: High  . Cigarette smoker 12/08/2013  . Thrombocytopenia, unspecified 11/17/2013  . Epistaxis 11/17/2013  . Normocytic anemia 11/17/2013  . Chronic otitis media 11/17/2013  . Transaminitis 11/17/2013  . Nasopharyngeal mass 11/16/2013    Patient's Medications  New Prescriptions   No medications on file  Previous Medications   ELVITEGRAVIR-COBICISTAT-EMTRICITABINE-TENOFOVIR (STRIBILD) 150-150-200-300 MG TABS TABLET    Take 1 tablet by mouth daily with breakfast.   SULFAMETHOXAZOLE-TRIMETHOPRIM (BACTRIM DS) 800-160 MG PER TABLET    Take 1 tablet by mouth daily.  Modified Medications   No medications on file  Discontinued Medications   No medications on file    Subjective: [name] is in for his routine visit. He has not had any problems taking her tolerating his Stribild. He takes it first thing in the morning, sometimes with food but sometimes are empty stomach. He recalls missing a few doses when he had to rush to work before taking it. He continues to take his trimethoprim sulfamethoxazole. He celebrated his 28th birthday this summer. He is feeling well. He would like to gain some weight. Review of Systems: Pertinent items are noted in HPI.  Past Medical History  Diagnosis Date  . ITP (idiopathic thrombocytopenic purpura) 11/24/2013    History  Substance Use Topics  . Smoking status: Former Smoker -- 0.10 packs/day for 12 years    Types: Cigarettes    Start date: 12/30/2001    Quit date: 06/26/2014  . Smokeless tobacco: Never Used     Comment: pt states he wants to quit by his birthday in July  . Alcohol Use: No    No family history on file.  No Known Allergies  Objective: Temp: 98.3 F (36.8  C) (09/22 0856) Temp src: Oral (09/22 0856) BP: 123/82 mmHg (09/22 0856) Pulse Rate: 69 (09/22 0856) Body mass index is 23.87 kg/(m^2).  General: He appears quite healthy. His body mass index is just over 23 Oral: No oropharyngeal lesions Skin: No rash Lungs: Clear Cor: Regular S1 and S2 with no murmurs   Lab Results Lab Results  Component Value Date   WBC 3.9* 12/02/2013   HGB 9.5* 12/02/2013   HCT 29.4* 12/02/2013   MCV 84 12/02/2013   PLT 80* 12/02/2013    Lab Results  Component Value Date   CREATININE 0.71 11/27/2013   BUN 8 11/27/2013   NA 134* 11/27/2013   K 3.9 11/27/2013   CL 102 11/27/2013   CO2 26 11/27/2013    Lab Results  Component Value Date   ALT 52 11/18/2013   AST 104* 11/18/2013   ALKPHOS 78 11/18/2013   BILITOT 0.5 11/18/2013    No results found for this basename: CHOL, HDL, LDLCALC, LDLDIRECT, TRIG, CHOLHDL    Lab Results HIV 1 RNA Quant (copies/mL)  Date Value  02/28/2014 <20   11/18/2013 10190*  11/16/2013 5132*     CD4 T Cell Abs (/uL)  Date Value  02/28/2014 180*  11/18/2013 100*     Assessment: He is off to a good start with his antiretroviral therapy. I talked him about taking his Stribild with food each morning. I also asked him to get a small pill  container and keep to 3 days worth of Stribild in his car so he does not have to miss doses when he is away from home.  Plan: 1. Continue Stribild 2. Check lab work today 3. Check a hepatitis C viral load 4. Followup in 3 months 5. Influenza vaccination today   [name], MD [facility] [place] [facility] [phone] pager   [phone] cell 06/27/2014, 9:19 AM

## 2014-06-28 ENCOUNTER — Other Ambulatory Visit: Payer: Self-pay | Admitting: *Deleted

## 2014-06-28 DIAGNOSIS — B20 Human immunodeficiency virus [HIV] disease: Secondary | ICD-10-CM

## 2014-06-28 LAB — HIV-1 RNA QUANT-NO REFLEX-BLD: HIV 1 RNA Quant: <20 copies/mL (ref <20)

## 2014-06-28 LAB — T-HELPER CELL (CD4) - (RCID CLINIC ONLY)
CD4 T CELL ABS: 290 /uL — AB (ref 400–2700)
CD4 T CELL HELPER: 17 % — AB (ref 33–55)

## 2014-06-28 MED ORDER — SULFAMETHOXAZOLE-TMP DS 800-160 MG PO TABS: 1.0000 | ORAL_TABLET | Freq: Every day | ORAL | Status: DC

## 2014-06-28 MED ORDER — ELVITEG-COBIC-EMTRICIT-TENOFDF 150-150-200-300 MG PO TABS: 1.0000 | ORAL_TABLET | Freq: Every day | ORAL | Status: DC

## 2014-06-29 LAB — HEPATITIS C RNA QUANTITATIVE: HCV QUANT: NOT DETECTED [IU]/mL (ref <15)

## 2014-09-11 ENCOUNTER — Other Ambulatory Visit: Payer: Self-pay | Admitting: Internal Medicine

## 2014-09-19 ENCOUNTER — Ambulatory Visit (INDEPENDENT_AMBULATORY_CARE_PROVIDER_SITE_OTHER): Payer: Self-pay | Admitting: Internal Medicine

## 2014-09-19 ENCOUNTER — Encounter: Payer: Self-pay | Admitting: Internal Medicine

## 2014-09-19 DIAGNOSIS — B20 Human immunodeficiency virus [HIV] disease: Secondary | ICD-10-CM

## 2014-09-19 LAB — BASIC METABOLIC PANEL
BUN: 10 mg/dL (ref 6–23)
CALCIUM: 8.6 mg/dL (ref 8.4–10.5)
CO2: 27 mEq/L (ref 19–32)
CREATININE: 1.04 mg/dL (ref 0.50–1.35)
Chloride: 104 mEq/L (ref 96–112)
GLUCOSE: 88 mg/dL (ref 70–99)
POTASSIUM: 4.1 meq/L (ref 3.5–5.3)
Sodium: 137 mEq/L (ref 135–145)

## 2014-09-19 MED ORDER — ELVITEG-COBIC-EMTRICIT-TENOFAF 150-150-200-10 MG PO TABS: 1.0000 | ORAL_TABLET | Freq: Every day | ORAL | Status: DC

## 2014-09-19 NOTE — Progress Notes (Signed)
Patient ID: [name], male   DOB: [birth year], 28 y.o.   MRN: [id number]          Patient Active Problem List   Diagnosis Date Noted  . Positive hepatitis C antibody test 11/18/2013    Priority: High  . Human immunodeficiency virus (HIV) disease 11/17/2013    Priority: High  . Cigarette smoker 12/08/2013  . Thrombocytopenia, unspecified 11/17/2013  . Epistaxis 11/17/2013  . Normocytic anemia 11/17/2013  . Chronic otitis media 11/17/2013  . Transaminitis 11/17/2013  . Nasopharyngeal mass 11/16/2013    Patient's Medications  New Prescriptions   ELVITEGRAVIR-COBICISTAT-EMTRICITABINE-TENOFOVIR (GENVOYA) 150-150-200-10 MG TABS TABLET    Take 1 tablet by mouth daily with breakfast.  Previous Medications   No medications on file  Modified Medications   No medications on file  Discontinued Medications   ELVITEGRAVIR-COBICISTAT-EMTRICITABINE-TENOFOVIR (STRIBILD) 150-150-200-300 MG TABS TABLET    Take 1 tablet by mouth daily with breakfast.   STRIBILD 150-150-200-300 MG TABS TABLET    TAKE 1 TABLET BY MOUTH EVERY DAY WITH BREAKFAST   SULFAMETHOXAZOLE-TRIMETHOPRIM (BACTRIM DS) 800-160 MG PER TABLET    Take 1 tablet by mouth daily.    Subjective: [name] is in for his routine visit. He recalls missing 3 doses of his Stribild since his last visit when he went out of town and forgot to take his medication. He is feeling well. He is no longer smoking cigarettes. He will be getting a new job at [employer] in January. He plans to spend the holidays with his family.  Review of Systems: Pertinent items are noted in HPI.  Past Medical History  Diagnosis Date  . ITP (idiopathic thrombocytopenic purpura) 11/24/2013    History  Substance Use Topics  . Smoking status: Former Smoker -- 0.10 packs/day for 12 years    Types: Cigarettes    Start date: 12/30/2001    Quit date: 06/26/2014  . Smokeless tobacco: Never Used     Comment: pt states he wants to quit by his birthday in July  . Alcohol  Use: No    No family history on file.  No Known Allergies  Objective: Temp: 98 F (36.7 C) (12/15 0849) Temp Source: Oral (12/15 0849) BP: 124/87 mmHg (12/15 0849) Pulse Rate: 65 (12/15 0849) Body mass index is 26.05 kg/(m^2).  General: He is in good spirits Oral: No oropharyngeal lesions Skin: No rash Lungs: Clear Cor: Regular S1 and S2 with no murmur   Lab Results Lab Results  Component Value Date   WBC 4.0 06/27/2014   HGB 12.6* 06/27/2014   HCT 38.2* 06/27/2014   MCV 82.9 06/27/2014   PLT 136* 06/27/2014    Lab Results  Component Value Date   CREATININE 1.07 06/27/2014   BUN 13 06/27/2014   NA 137 06/27/2014   K 4.7 06/27/2014   CL 105 06/27/2014   CO2 28 06/27/2014    Lab Results  Component Value Date   ALT 52 11/18/2013   AST 104* 11/18/2013   ALKPHOS 78 11/18/2013   BILITOT 0.5 11/18/2013    No results found for: CHOL, HDL, LDLCALC, LDLDIRECT, TRIG, CHOLHDL  Lab Results HIV 1 RNA QUANT (copies/mL)  Date Value  06/27/2014 <20  02/28/2014 <20  11/18/2013 10190*   CD4 T CELL ABS (/uL)  Date Value  06/27/2014 290*  02/28/2014 180*  11/18/2013 100*     Assessment: His HIV infection is coming under very good control.  Plan: 1. Change Stribild to Genvoya 2. Discontinue pneumocystis prophylaxis 3. Check lab  work today 4. Follow-up in 3 months   [name], MD [facility] for [place] [phone] pager   [phone] cell 09/19/2014, 8:59 AM

## 2014-09-20 LAB — T-HELPER CELL (CD4) - (RCID CLINIC ONLY)
CD4 T CELL HELPER: 15 % — AB (ref 33–55)
CD4 T Cell Abs: 220 /uL — ABNORMAL LOW (ref 400–2700)

## 2014-09-20 LAB — HIV-1 RNA QUANT-NO REFLEX-BLD: HIV-1 RNA Quant, Log: <1.3 {Log} (ref <1.30)

## 2014-12-08 ENCOUNTER — Other Ambulatory Visit: Payer: Self-pay

## 2014-12-13 ENCOUNTER — Telehealth: Payer: Self-pay | Admitting: *Deleted

## 2014-12-13 NOTE — Telephone Encounter (Signed)
Appt 12/19/14 @ 8:45 w/ Dr. Megan Salon.

## 2014-12-13 NOTE — Telephone Encounter (Signed)
Patient wants to reschedule msised visit.

## 2014-12-18 ENCOUNTER — Other Ambulatory Visit: Payer: Self-pay | Admitting: *Deleted

## 2014-12-18 DIAGNOSIS — Z113 Encounter for screening for infections with a predominantly sexual mode of transmission: Secondary | ICD-10-CM

## 2014-12-19 ENCOUNTER — Ambulatory Visit: Payer: Self-pay | Admitting: Internal Medicine

## 2015-01-11 ENCOUNTER — Other Ambulatory Visit: Payer: Self-pay

## 2015-01-11 ENCOUNTER — Ambulatory Visit (INDEPENDENT_AMBULATORY_CARE_PROVIDER_SITE_OTHER): Payer: Self-pay

## 2015-01-11 DIAGNOSIS — B20 Human immunodeficiency virus [HIV] disease: Secondary | ICD-10-CM

## 2015-01-11 DIAGNOSIS — Z113 Encounter for screening for infections with a predominantly sexual mode of transmission: Secondary | ICD-10-CM

## 2015-01-11 LAB — COMPLETE METABOLIC PANEL WITH GFR
ALT: 24 U/L (ref 0–53)
AST: 33 U/L (ref 0–37)
Albumin: 3.3 g/dL — ABNORMAL LOW (ref 3.5–5.2)
Alkaline Phosphatase: 91 U/L (ref 39–117)
BILIRUBIN TOTAL: 0.5 mg/dL (ref 0.2–1.2)
BUN: 12 mg/dL (ref 6–23)
CO2: 24 meq/L (ref 19–32)
CREATININE: 0.88 mg/dL (ref 0.50–1.35)
Calcium: 8.6 mg/dL (ref 8.4–10.5)
Chloride: 107 mEq/L (ref 96–112)
GFR, Est Non African American: >89 mL/min
Glucose, Bld: 83 mg/dL (ref 70–99)
Potassium: 3.9 mEq/L (ref 3.5–5.3)
SODIUM: 140 meq/L (ref 135–145)
Total Protein: 6.9 g/dL (ref 6.0–8.3)

## 2015-01-11 LAB — CBC
HCT: 39.8 % (ref 39.0–52.0)
Hemoglobin: 13.7 g/dL (ref 13.0–17.0)
MCH: 29.1 pg (ref 26.0–34.0)
MCHC: 34.4 g/dL (ref 30.0–36.0)
MCV: 84.7 fL (ref 78.0–100.0)
MPV: 11.1 fL (ref 8.6–12.4)
Platelets: 112 10*3/uL — ABNORMAL LOW (ref 150–400)
RBC: 4.7 MIL/uL (ref 4.22–5.81)
RDW: 14.8 % (ref 11.5–15.5)
WBC: 5.4 10*3/uL (ref 4.0–10.5)

## 2015-01-11 NOTE — Addendum Note (Signed)
Addended by: Dolan Amen D on: 01/11/2015 03:45 PM   Modules accepted: Orders

## 2015-01-12 ENCOUNTER — Other Ambulatory Visit: Payer: Self-pay | Admitting: Licensed Clinical Social Worker

## 2015-01-12 DIAGNOSIS — B20 Human immunodeficiency virus [HIV] disease: Secondary | ICD-10-CM

## 2015-01-12 LAB — HEPATITIS C RNA QUANTITATIVE: HCV QUANT: NOT DETECTED [IU]/mL (ref <15)

## 2015-01-12 LAB — RPR

## 2015-01-12 LAB — T-HELPER CELL (CD4) - (RCID CLINIC ONLY)
CD4 T CELL HELPER: 15 % — AB (ref 33–55)
CD4 T Cell Abs: 340 /uL — ABNORMAL LOW (ref 400–2700)

## 2015-01-12 MED ORDER — ELVITEG-COBIC-EMTRICIT-TENOFAF 150-150-200-10 MG PO TABS: 1.0000 | ORAL_TABLET | Freq: Every day | ORAL | Status: DC

## 2015-01-13 LAB — HIV-1 RNA QUANT-NO REFLEX-BLD: HIV-1 RNA Quant, Log: <1.3 {Log} (ref <1.30)

## 2015-02-08 ENCOUNTER — Encounter: Payer: Self-pay | Admitting: Internal Medicine

## 2015-02-08 ENCOUNTER — Ambulatory Visit (INDEPENDENT_AMBULATORY_CARE_PROVIDER_SITE_OTHER): Payer: Self-pay | Admitting: Internal Medicine

## 2015-02-08 VITALS — BP 124/78 | HR 82 | Temp 98.2°F | Ht 74.0 in | Wt 215.0 lb

## 2015-02-08 DIAGNOSIS — R040 Epistaxis: Secondary | ICD-10-CM

## 2015-02-08 DIAGNOSIS — B20 Human immunodeficiency virus [HIV] disease: Secondary | ICD-10-CM

## 2015-02-08 NOTE — Addendum Note (Signed)
Addended by: Lorne Skeens D on: 02/08/2015 11:05 AM   Modules accepted: Orders

## 2015-02-08 NOTE — Progress Notes (Signed)
Pt does not currently have private insurance.  He is going to check with his employer about cost and signing up for their plan.

## 2015-02-08 NOTE — Progress Notes (Signed)
Patient ID: [name], male   DOB: [birth year], 29 y.o.   MRN: [id number]          Patient Active Problem List   Diagnosis Date Noted  . Positive hepatitis C antibody test 11/18/2013    Priority: High  . Human immunodeficiency virus (HIV) disease 11/17/2013    Priority: High  . Cigarette smoker 12/08/2013  . Thrombocytopenia, unspecified 11/17/2013  . Epistaxis 11/17/2013  . Normocytic anemia 11/17/2013  . Chronic otitis media 11/17/2013  . Transaminitis 11/17/2013  . Nasopharyngeal mass 11/16/2013    Patient's Medications  New Prescriptions   No medications on file  Previous Medications   ELVITEGRAVIR-COBICISTAT-EMTRICITABINE-TENOFOVIR (GENVOYA) 150-150-200-10 MG TABS TABLET    Take 1 tablet by mouth daily with breakfast.  Modified Medications   No medications on file  Discontinued Medications   No medications on file    Subjective: [name] is in for his routine HIV follow-up visit. He is feeling well and enjoys his work at [employer]. He is currently taking a vacation to [place] soon. He has not had any problems or concerns since his last visit. He denies missing any doses of his Genvoya. He takes it at noon each day before going to work but does not always take it with a meal. He is not feeling depressed. He is not in a relationship and has not been sexually active. He did have one more episode of nose bleeding last week. He quit smoking cigarettes last fall and has not relapsed. He just joined a gym and started working out again.   Review of Systems: Constitutional: negative Eyes: negative Ears, nose, mouth, throat, and face: positive for epistaxis Respiratory: negative Cardiovascular: negative Gastrointestinal: negative Genitourinary:negative  Past Medical History  Diagnosis Date  . ITP (idiopathic thrombocytopenic purpura) 11/24/2013    History  Substance Use Topics  . Smoking status: Former Smoker -- 0.10 packs/day for 12 years    Types: Cigarettes    Start  date: 12/30/2001    Quit date: 06/26/2014  . Smokeless tobacco: Never Used     Comment: pt states he wants to quit by his birthday in July  . Alcohol Use: No    No family history on file.  No Known Allergies  Objective: Temp: 98.2 F (36.8 C) (05/05 1020) Temp Source: Oral (05/05 1020) BP: 124/78 mmHg (05/05 1020) Pulse Rate: 82 (05/05 1020) Body mass index is 27.59 kg/(m^2).  General: He is healthy and in good spirits Oral: No oropharyngeal lesions Skin: No rash Lungs: Clear Cor: Regular S1 and S2 with no murmurs Abdomen: Soft and nontender Mood: Normal. He does not appear anxious or depressed.  Lab Results Lab Results  Component Value Date   WBC 5.4 01/11/2015   HGB 13.7 01/11/2015   HCT 39.8 01/11/2015   MCV 84.7 01/11/2015   PLT 112* 01/11/2015    Lab Results  Component Value Date   CREATININE 0.88 01/11/2015   BUN 12 01/11/2015   NA 140 01/11/2015   K 3.9 01/11/2015   CL 107 01/11/2015   CO2 24 01/11/2015    Lab Results  Component Value Date   ALT 24 01/11/2015   AST 33 01/11/2015   ALKPHOS 91 01/11/2015   BILITOT 0.5 01/11/2015    No results found for: CHOL, HDL, LDLCALC, LDLDIRECT, TRIG, CHOLHDL  Lab Results HIV 1 RNA QUANT (copies/mL)  Date Value  01/11/2015 <20  09/19/2014 <20  06/27/2014 <20   CD4 T CELL ABS (/uL)  Date Value  01/11/2015 340*  09/19/2014 220*  06/27/2014 290*     Assessment: His HIV infection is under excellent control and he is having steady CD4 reconstitution. I instructed him to take his Genvoya each day with a meal.   Last year when he was first diagnosed with HIV infection he had nasal lymphoid hyperplasia and epistaxis. I'm hopeful that the hyperplasia has improved with treatment of his infection but given his recent epistaxis I would suggest that he follow-up with Dr. Herbie Baltimore, the ENT physician that saw him in the hospital.  His depression has resolved.   Plan: Continue [name] follow-up Follow-up  here in 6 months after lab work  [name], [place] for [name] [phone] pager   [phone] cell 02/08/2015, 10:44 AM

## 2015-05-09 ENCOUNTER — Telehealth: Payer: Self-pay | Admitting: *Deleted

## 2015-05-09 NOTE — Telephone Encounter (Signed)
Patient will come in tomorrow to do his ADAP.

## 2015-05-10 ENCOUNTER — Ambulatory Visit: Payer: Self-pay

## 2015-07-06 NOTE — Progress Notes (Signed)
Faxed approval to [facility]. Howell, Michelle M, RN  

## 2015-09-22 ENCOUNTER — Other Ambulatory Visit: Payer: Self-pay | Admitting: Internal Medicine

## 2015-12-12 ENCOUNTER — Ambulatory Visit: Payer: Self-pay

## 2015-12-12 ENCOUNTER — Other Ambulatory Visit (INDEPENDENT_AMBULATORY_CARE_PROVIDER_SITE_OTHER): Payer: Self-pay

## 2015-12-12 DIAGNOSIS — B20 Human immunodeficiency virus [HIV] disease: Secondary | ICD-10-CM

## 2015-12-12 LAB — COMPREHENSIVE METABOLIC PANEL
ALK PHOS: 80 U/L (ref 40–115)
ALT: 42 U/L (ref 9–46)
AST: 122 U/L — ABNORMAL HIGH (ref 10–40)
Albumin: 3.5 g/dL — ABNORMAL LOW (ref 3.6–5.1)
BUN: 11 mg/dL (ref 7–25)
CO2: 28 mmol/L (ref 20–31)
Calcium: 8.6 mg/dL (ref 8.6–10.3)
Chloride: 103 mmol/L (ref 98–110)
Creat: 1.29 mg/dL (ref 0.60–1.35)
GLUCOSE: 90 mg/dL (ref 65–99)
POTASSIUM: 3.8 mmol/L (ref 3.5–5.3)
SODIUM: 137 mmol/L (ref 135–146)
Total Bilirubin: 0.5 mg/dL (ref 0.2–1.2)
Total Protein: 6.9 g/dL (ref 6.1–8.1)

## 2015-12-12 LAB — CBC
HCT: 38.7 % — ABNORMAL LOW (ref 39.0–52.0)
Hemoglobin: 12.8 g/dL — ABNORMAL LOW (ref 13.0–17.0)
MCH: 29.2 pg (ref 26.0–34.0)
MCHC: 33.1 g/dL (ref 30.0–36.0)
MCV: 88.2 fL (ref 78.0–100.0)
MPV: 10.7 fL (ref 8.6–12.4)
PLATELETS: 123 10*3/uL — AB (ref 150–400)
RBC: 4.39 MIL/uL (ref 4.22–5.81)
RDW: 14.4 % (ref 11.5–15.5)
WBC: 6.5 10*3/uL (ref 4.0–10.5)

## 2015-12-14 LAB — T-HELPER CELL (CD4) - (RCID CLINIC ONLY)
CD4 T CELL HELPER: 17 % — AB (ref 33–55)
CD4 T Cell Abs: 490 /uL (ref 400–2700)

## 2015-12-14 LAB — HIV-1 RNA QUANT-NO REFLEX-BLD
HIV 1 RNA Quant: <20 copies/mL (ref <20)
HIV-1 RNA Quant, Log: <1.3 Log copies/mL (ref <1.30)

## 2016-01-02 ENCOUNTER — Encounter: Payer: Self-pay | Admitting: Internal Medicine

## 2016-01-02 ENCOUNTER — Ambulatory Visit (INDEPENDENT_AMBULATORY_CARE_PROVIDER_SITE_OTHER): Payer: Self-pay | Admitting: Internal Medicine

## 2016-01-02 VITALS — BP 127/80 | HR 73 | Temp 98.5°F | Wt 213.0 lb

## 2016-01-02 DIAGNOSIS — R894 Abnormal immunological findings in specimens from other organs, systems and tissues: Secondary | ICD-10-CM

## 2016-01-02 DIAGNOSIS — R768 Other specified abnormal immunological findings in serum: Secondary | ICD-10-CM

## 2016-01-02 DIAGNOSIS — J392 Other diseases of pharynx: Secondary | ICD-10-CM

## 2016-01-02 DIAGNOSIS — B20 Human immunodeficiency virus [HIV] disease: Secondary | ICD-10-CM

## 2016-01-02 MED ORDER — LORATADINE 10 MG PO TABS: 10.0000 mg | ORAL_TABLET | Freq: Every day | ORAL | Status: DC

## 2016-01-02 NOTE — Assessment & Plan Note (Signed)
His chronic sinus congestion may be multifactorial. When he was first diagnosed with HIV infection he was found to have large lymphoid hyperplasia but this is likely to have improved with HIV therapy. His marijuana smoking may have an impact on sinus congestion. He may also have some component of allergic rhinitis. I will have him try a daily loratadine for one month.

## 2016-01-02 NOTE — Assessment & Plan Note (Signed)
His hepatitis C viral load is undetectable without ever having been treated. This is indicative of spontaneous clearing of his infection.

## 2016-01-02 NOTE — Assessment & Plan Note (Signed)
His HIV infection has come under excellent control since starting therapy 2 years ago and he's had steady CD4 reconstitution back to normal levels. He will continue Genvoya and follow-up after lab work in 6 months. I talked him about the ongoing importance of limiting the number of sexual partners he has in the future, careful partners collection and consistent in correct use of condoms.

## 2016-01-02 NOTE — Progress Notes (Signed)
Patient Active Problem List   Diagnosis Date Noted  . Positive hepatitis C antibody test 11/18/2013    Priority: High  . Human immunodeficiency virus (HIV) disease ([place]) 11/17/2013    Priority: High  . Cigarette smoker 12/08/2013  . Thrombocytopenia, unspecified ([place]) 11/17/2013  . Epistaxis 11/17/2013  . Normocytic anemia 11/17/2013  . Chronic otitis media 11/17/2013  . Transaminitis 11/17/2013  . Nasopharyngeal mass 11/16/2013    Patient's Medications  New Prescriptions   LORATADINE (CLARITIN) 10 MG TABLET    Take 1 tablet (10 mg total) by mouth daily.  Previous Medications   ELVITEGRAVIR-COBICISTAT-EMTRICITABINE-TENOFOVIR (GENVOYA) 150-150-200-10 MG TABS TABLET    Take 1 tablet by mouth daily with breakfast.  Modified Medications   No medications on file  Discontinued Medications   GENVOYA 150-150-200-10 MG TABS TABLET    TAKE 1 TABLET BY MOUTH DAILY WITH BREAKFAST    Subjective: [name] is in for his routine HIV follow-up visit. He takes his Genvoya every morning with breakfast. He has no problems tolerating it and has not missed any doses. He is on no other medication. He has had no further problem with nosebleeds since his last visit but he is bothered by daily sinus congestion. He blows his nose frequently. He does not have much sneezing and he does not notice any itchy, red eyes. He has never considered himself to have seasonal allergies. He does smoke marijuana on a regular basis. He has still successfully quit smoking cigarettes. He was recently exposed to someone who supposedly had scabies. He developed an intensely pruritic rash on his forearms. He was seen at the health department and prescribed topical lotion but could not afford it. He bought over-the-counter Nix and the itching subsided within 1 week. He has not been sexually active since his diagnosis.  Review of Systems: Review of Systems  Constitutional: Negative for fever, chills, weight loss,  malaise/fatigue and diaphoresis.  HENT: Positive for congestion. Negative for nosebleeds and sore throat.   Respiratory: Negative for cough, sputum production and shortness of breath.   Cardiovascular: Negative for chest pain.  Gastrointestinal: Negative for nausea, vomiting, abdominal pain and diarrhea.  Genitourinary: Negative for dysuria.  Musculoskeletal: Negative for myalgias and joint pain.  Skin: Positive for rash.  Neurological: Negative for dizziness and headaches.  Psychiatric/Behavioral: Positive for substance abuse. Negative for depression. The patient is not nervous/anxious.     Past Medical History  Diagnosis Date  . ITP (idiopathic thrombocytopenic purpura) 11/24/2013    Social History  Substance Use Topics  . Smoking status: Former Smoker -- 0.10 packs/day for 12 years    Types: Cigarettes    Start date: 12/30/2001    Quit date: 06/26/2014  . Smokeless tobacco: Never Used     Comment: pt states he wants to quit by his birthday in July  . Alcohol Use: No    No family history on file.  No Known Allergies  Objective:  Filed Vitals:   01/02/16 1505  BP: 127/80  Pulse: 73  Temp: 98.5 F (36.9 C)  TempSrc: Oral  Weight: 213 lb (96.616 kg)   Body mass index is 27.34 kg/(m^2).  Physical Exam  Constitutional: He is oriented to person, place, and time.  He is smiling and in good spirits.  HENT:  Mouth/Throat: No oropharyngeal exudate.  He does sound slightly congested. Nasal mucosa are pink and moist.  Eyes: Conjunctivae are normal.  Cardiovascular: Normal rate and regular rhythm.  No murmur heard. Pulmonary/Chest: Effort normal and breath sounds normal. He has no wheezes. He has no rales.  Abdominal: Soft. He exhibits no mass. There is no tenderness.  Musculoskeletal: Normal range of motion.  Neurological: He is alert and oriented to person, place, and time.  Skin: No rash noted.  Scattered dark macules on forearms.  Psychiatric: Mood and affect  normal.    Lab Results Lab Results  Component Value Date   WBC 6.5 12/12/2015   HGB 12.8* 12/12/2015   HCT 38.7* 12/12/2015   MCV 88.2 12/12/2015   PLT 123* 12/12/2015    Lab Results  Component Value Date   CREATININE 1.29 12/12/2015   BUN 11 12/12/2015   NA 137 12/12/2015   K 3.8 12/12/2015   CL 103 12/12/2015   CO2 28 12/12/2015    Lab Results  Component Value Date   ALT 42 12/12/2015   AST 122* 12/12/2015   ALKPHOS 80 12/12/2015   BILITOT 0.5 12/12/2015    No results found for: CHOL, HDL, LDLCALC, LDLDIRECT, TRIG, CHOLHDL  Lab Results HIV 1 RNA QUANT (copies/mL)  Date Value  12/12/2015 <20  01/11/2015 <20  09/19/2014 <20   CD4 T CELL ABS (/uL)  Date Value  12/12/2015 490  01/11/2015 340*  09/19/2014 220*      Problem List Items Addressed This Visit      High   Human immunodeficiency virus (HIV) disease ([place])    His HIV infection has come under excellent control since starting therapy 2 years ago and he's had steady CD4 reconstitution back to normal levels. He will continue Genvoya and follow-up after lab work in 6 months. I talked him about the ongoing importance of limiting the number of sexual partners he has in the future, careful partners collection and consistent in correct use of condoms.      Relevant Orders   T-helper cell (CD4)- ([facility] only)   HIV 1 RNA quant-no reflex-bld   CBC   Comprehensive metabolic panel   Positive hepatitis C antibody test - Primary    His hepatitis C viral load is undetectable without ever having been treated. This is indicative of spontaneous clearing of his infection.        Unprioritized   Nasopharyngeal mass    His chronic sinus congestion may be multifactorial. When he was first diagnosed with HIV infection he was found to have large lymphoid hyperplasia but this is likely to have improved with HIV therapy. His marijuana smoking may have an impact on sinus congestion. He may also have some component of  allergic rhinitis. I will have him try a daily loratadine for one month.      Relevant Medications   loratadine (CLARITIN) 10 MG tablet        [name], MD [facility] [phone] pager   [phone] cell 01/02/2016, 3:32 PM

## 2016-01-29 ENCOUNTER — Other Ambulatory Visit: Payer: Self-pay | Admitting: *Deleted

## 2016-01-29 DIAGNOSIS — B20 Human immunodeficiency virus [HIV] disease: Secondary | ICD-10-CM

## 2016-01-29 MED ORDER — ELVITEG-COBIC-EMTRICIT-TENOFAF 150-150-200-10 MG PO TABS: 1.0000 | ORAL_TABLET | Freq: Every day | ORAL | Status: DC

## 2016-02-07 ENCOUNTER — Other Ambulatory Visit: Payer: Self-pay | Admitting: *Deleted

## 2016-02-07 DIAGNOSIS — B20 Human immunodeficiency virus [HIV] disease: Secondary | ICD-10-CM

## 2016-02-07 MED ORDER — ELVITEG-COBIC-EMTRICIT-TENOFAF 150-150-200-10 MG PO TABS: 1.0000 | ORAL_TABLET | Freq: Every day | ORAL | Status: DC

## 2016-02-07 NOTE — Telephone Encounter (Signed)
[facility] Application. 

## 2016-02-20 ENCOUNTER — Encounter: Payer: Self-pay | Admitting: Internal Medicine

## 2016-07-17 ENCOUNTER — Ambulatory Visit: Payer: Self-pay

## 2016-07-21 ENCOUNTER — Encounter: Payer: Self-pay | Admitting: Internal Medicine

## 2016-10-07 ENCOUNTER — Other Ambulatory Visit: Payer: Self-pay | Admitting: Internal Medicine

## 2016-10-07 DIAGNOSIS — B20 Human immunodeficiency virus [HIV] disease: Secondary | ICD-10-CM

## 2017-03-11 ENCOUNTER — Other Ambulatory Visit: Payer: Self-pay | Admitting: Pharmacist Clinician (PhC)/ Clinical Pharmacy Specialist

## 2017-03-11 MED ORDER — ELVITEG-COBIC-EMTRICIT-TENOFAF 150-150-200-10 MG PO TABS: 1.0000 | ORAL_TABLET | Freq: Every day | ORAL | 1 refills | Status: DC

## 2017-03-11 NOTE — Progress Notes (Signed)
Temp refill for [facility] until ADAP is approved.

## 2017-03-13 ENCOUNTER — Encounter: Payer: Self-pay | Admitting: Internal Medicine

## 2017-04-09 ENCOUNTER — Other Ambulatory Visit: Payer: Self-pay

## 2017-04-22 ENCOUNTER — Ambulatory Visit: Payer: Self-pay

## 2017-04-22 ENCOUNTER — Ambulatory Visit: Payer: Self-pay | Admitting: Internal Medicine

## 2017-04-22 ENCOUNTER — Ambulatory Visit (INDEPENDENT_AMBULATORY_CARE_PROVIDER_SITE_OTHER): Payer: Self-pay | Admitting: Pharmacist

## 2017-04-22 DIAGNOSIS — Z79899 Other long term (current) drug therapy: Secondary | ICD-10-CM

## 2017-04-22 DIAGNOSIS — Z113 Encounter for screening for infections with a predominantly sexual mode of transmission: Secondary | ICD-10-CM

## 2017-04-22 DIAGNOSIS — B20 Human immunodeficiency virus [HIV] disease: Secondary | ICD-10-CM

## 2017-04-22 LAB — CBC WITH DIFFERENTIAL/PLATELET
BASOS ABS: 53 {cells}/uL (ref 0–200)
Basophils Relative: 1 %
EOS ABS: 53 {cells}/uL (ref 15–500)
Eosinophils Relative: 1 %
HEMATOCRIT: 39.9 % (ref 38.5–50.0)
Hemoglobin: 13.5 g/dL (ref 13.2–17.1)
LYMPHS PCT: 39 %
Lymphs Abs: 2067 cells/uL (ref 850–3900)
MCH: 29.7 pg (ref 27.0–33.0)
MCHC: 33.8 g/dL (ref 32.0–36.0)
MCV: 87.9 fL (ref 80.0–100.0)
MONO ABS: 636 {cells}/uL (ref 200–950)
MPV: 11.3 fL (ref 7.5–12.5)
Monocytes Relative: 12 %
NEUTROS PCT: 47 %
Neutro Abs: 2491 cells/uL (ref 1500–7800)
Platelets: 133 10*3/uL — ABNORMAL LOW (ref 140–400)
RBC: 4.54 MIL/uL (ref 4.20–5.80)
RDW: 13.9 % (ref 11.0–15.0)
WBC: 5.3 10*3/uL (ref 3.8–10.8)

## 2017-04-22 LAB — LIPID PANEL
CHOL/HDL RATIO: 4.4 ratio (ref <5.0)
CHOLESTEROL: 188 mg/dL (ref <200)
HDL: 43 mg/dL (ref >40)
LDL Cholesterol: 105 mg/dL — ABNORMAL HIGH (ref <100)
Triglycerides: 199 mg/dL — ABNORMAL HIGH (ref <150)
VLDL: 40 mg/dL — AB (ref <30)

## 2017-04-22 LAB — COMPREHENSIVE METABOLIC PANEL
ALT: 12 U/L (ref 9–46)
AST: 22 U/L (ref 10–40)
Albumin: 3.7 g/dL (ref 3.6–5.1)
Alkaline Phosphatase: 84 U/L (ref 40–115)
BUN: 19 mg/dL (ref 7–25)
CALCIUM: 8.9 mg/dL (ref 8.6–10.3)
CHLORIDE: 105 mmol/L (ref 98–110)
CO2: 25 mmol/L (ref 20–31)
Creat: 1.44 mg/dL — ABNORMAL HIGH (ref 0.60–1.35)
GLUCOSE: 84 mg/dL (ref 65–99)
POTASSIUM: 3.9 mmol/L (ref 3.5–5.3)
Sodium: 139 mmol/L (ref 135–146)
Total Bilirubin: 0.5 mg/dL (ref 0.2–1.2)
Total Protein: 6.8 g/dL (ref 6.1–8.1)

## 2017-04-22 MED ORDER — BICTEGRAVIR-EMTRICITAB-TENOFOV 50-200-25 MG PO TABS: 1.0000 | ORAL_TABLET | Freq: Every day | ORAL | 5 refills | Status: DC

## 2017-04-22 NOTE — Progress Notes (Signed)
HPI: [name] is a 31 y.o. male who presents to the [facility] today for an appointment with Dr. Megan Salon.  He arrived late and Dr. Megan Salon was booked up, so I was asked to see him.   Allergies: No Known Allergies  Past Medical History: Past Medical History:  Diagnosis Date  . ITP (idiopathic thrombocytopenic purpura) 11/24/2013    Social History: Social History   Social History  . Marital status: Single    Spouse name: N/A  . Number of children: N/A  . Years of education: N/A   Social History Main Topics  . Smoking status: Former Smoker    Packs/day: 0.10    Years: 12.00    Types: Cigarettes    Start date: 12/30/2001    Quit date: 06/26/2014  . Smokeless tobacco: Never Used     Comment: pt states he wants to quit by his birthday in July  . Alcohol use No  . Drug use: No  . Sexual activity: Not on file     Comment: declined condoms   Other Topics Concern  . Not on file   Social History Narrative  . No narrative on file    Current Regimen: Genvoya  Labs: HIV 1 RNA Quant (copies/mL)  Date Value  12/12/2015 <20  01/11/2015 <20  09/19/2014 <20   CD4 T Cell Abs (/uL)  Date Value  12/12/2015 490  01/11/2015 340 (L)  09/19/2014 220 (L)   Hep B S Ab (no units)  Date Value  11/17/2013 POSITIVE (A)   Hepatitis B Surface Ag (no units)  Date Value  11/18/2013 NEGATIVE   HCV Ab (no units)  Date Value  11/18/2013 Reactive (A)    CrCl: CrCl cannot be calculated (Patient's most recent lab result is older than the maximum 21 days allowed.).  Lipids: No results found for: CHOL, TRIG, HDL, CHOLHDL, VLDL, LDLCALC  Assessment: [name] is here today to follow-up with Dr. Megan Salon for his HIV infection.  He has not been seen since March 2017.  He states he has just been busy and that's why he hasn't been in. He is still taking his Genvoya but is asking to try something different because he states it makes him "cloudy" and is causing him to miss ~5 doses/month.  I  spent some time going over resistance with him and how important it is to let us know ASAP if he is having any issues and to try not to miss doses.  He states he really hasn't been taking the [place] with food since he takes it ~10pm at night before he goes to bed.  I gave him a pill box key chain and asked him to not miss any doses from now until he sees Dr. Megan Salon.  I will change him over to [facility] to see if his side effects get better.  Went over [place] and how to take it including with or without food.  Told him to call me if he has any issues. He has ADAP but will need to renew now. I will make a f/u appointment with Dr. Megan Salon for him, as well as Juliann Pulse to renew the ADAP. He also states he has not been sexually active since the beginning of the year.   Plans: - Stop Genvoya - Start Biktarvy PO once daily - HIV RNA, CD4, CBC, CMET, RPR, urine cytology today - F/u with Juliann Pulse for ADAP renewal 8/8 at 3pm - F/u with Dr. Megan Salon 8/8 at 345 pm  Cassie L. Kuppelweiser, PharmD,  [facility] [place] [facility] 04/22/2017, 3:50 PM

## 2017-04-22 NOTE — Addendum Note (Signed)
Addended by: Darletta Moll on: 04/22/2017 03:57 PM   Modules accepted: Level of Service

## 2017-04-22 NOTE — Patient Instructions (Signed)
Please stop taking your Genvoya.  The new HIV medication, Biktarvy, is waiting for you to pick up at [facility]. Let us know if you have any issues. [phone].  You will see Juliann Pulse to renew your ADAP on August 8th at 3pm and Dr. Megan Salon at 3:45pm.

## 2017-04-23 LAB — T-HELPER CELL (CD4) - (RCID CLINIC ONLY)
CD4 % Helper T Cell: 16 % — ABNORMAL LOW (ref 33–55)
CD4 T CELL ABS: 350 /uL — AB (ref 400–2700)

## 2017-04-23 LAB — RPR

## 2017-04-24 LAB — HIV-1 RNA QUANT-NO REFLEX-BLD
HIV 1 RNA Quant: <20 copies/mL — AB
HIV-1 RNA QUANT, LOG: DETECTED {Log_copies}/mL — AB

## 2017-04-24 LAB — URINE CYTOLOGY ANCILLARY ONLY
Chlamydia: NEGATIVE
NEISSERIA GONORRHEA: NEGATIVE

## 2017-05-13 ENCOUNTER — Ambulatory Visit: Payer: Self-pay

## 2017-05-13 ENCOUNTER — Ambulatory Visit (INDEPENDENT_AMBULATORY_CARE_PROVIDER_SITE_OTHER): Payer: Self-pay | Admitting: Internal Medicine

## 2017-05-13 DIAGNOSIS — J392 Other diseases of pharynx: Secondary | ICD-10-CM

## 2017-05-13 DIAGNOSIS — B20 Human immunodeficiency virus [HIV] disease: Secondary | ICD-10-CM

## 2017-05-14 ENCOUNTER — Encounter: Payer: Self-pay | Admitting: Internal Medicine

## 2017-05-14 NOTE — Assessment & Plan Note (Signed)
He had a nasal mass when he presented with epistaxis and was found to be HIV positive several years ago. The mass was probably due to lymphoid hypertrophy related to his infection. I suspect that has improved with therapy but I will attempt to get him back to see Dr. Warren Danes, the ENT physician who saw him in the hospital.

## 2017-05-14 NOTE — Assessment & Plan Note (Signed)
His infection is under excellent long-term control. He will continue Biktarvy and follow-up after lab work in 6 months.

## 2017-05-14 NOTE — Progress Notes (Signed)
[facility] for Infectious Disease  Patient Active Problem List   Diagnosis Date Noted  . Positive hepatitis C antibody test 11/18/2013    Priority: High  . Human immunodeficiency virus (HIV) disease ([place]) 11/17/2013    Priority: High  . Cigarette smoker 12/08/2013  . Thrombocytopenia, unspecified ([place]) 11/17/2013  . Epistaxis 11/17/2013  . Normocytic anemia 11/17/2013  . Chronic otitis media 11/17/2013  . Transaminitis 11/17/2013  . 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   LORATADINE (CLARITIN) 10 MG TABLET    Take 1 tablet (10 mg total) by mouth daily.  Modified Medications   No medications on file  Discontinued Medications   No medications on file    Subjective: [name] is in for his first visit since March 2017. He was seen recently by our ID pharmacist and mentioned that he felt cloudy while taking Genvoya. He changed to [facility] several weeks ago and is feeling better. He has not had any problems with anxiety or depression. He continues to be bothered by chronic sinus congestion. He did not find much relief with allergy medications last year so he stopped them. He has not had any further episodes of epistaxis. He continues to get regular exercise. He has not been sexually active since his diagnosis of HIV.  Review of Systems: Review of Systems  Constitutional: Negative for chills, diaphoresis, fever, malaise/fatigue and weight loss.  HENT: Positive for congestion. Negative for nosebleeds and sore throat.   Respiratory: Negative for cough, sputum production and shortness of breath.   Cardiovascular: Negative for chest pain.  Gastrointestinal: Negative for abdominal pain, diarrhea, heartburn, nausea and vomiting.  Genitourinary: Negative for dysuria and frequency.    Musculoskeletal: Negative for joint pain and myalgias.  Skin: Negative for rash.  Neurological: Negative for dizziness and headaches.  Psychiatric/Behavioral: Negative for depression and substance abuse. The patient is not nervous/anxious.     Past Medical History:  Diagnosis Date  . ITP (idiopathic thrombocytopenic purpura) 11/24/2013    Social History  Substance Use Topics  . Smoking status: Former Smoker    Packs/day: 0.10    Years: 12.00    Types: Cigarettes    Start date: 12/30/2001    Quit date: 06/26/2014  . Smokeless tobacco: Never Used     Comment: pt states he wants to quit by his birthday in July  . Alcohol use No    No family history on file.  No Known Allergies  Objective: Vitals:   05/14/17 0816  BP: 127/82  Pulse: 73  Temp: 98.7 F (37.1 C)  TempSrc: Oral  Weight: 218 lb (98.9 kg)   Body mass index is 27.99 kg/m.  Physical Exam  Constitutional: He is oriented to person, place, and time.  HENT:  Mouth/Throat: No oropharyngeal exudate.  Eyes: Conjunctivae are normal.  Cardiovascular: Normal rate and regular rhythm.   No murmur heard. Pulmonary/Chest: Effort normal and breath sounds normal. He has no wheezes. He has no rales.  Abdominal: Soft. He exhibits no mass. There is no tenderness.  Musculoskeletal: Normal range of motion.  Neurological: He is alert and oriented to person, place, and time.  Skin: No rash noted.  Psychiatric: Mood and affect normal.    Lab Results    Problem List Items Addressed This Visit  High   Human immunodeficiency virus (HIV) disease ([name])    His infection is under excellent long-term control. He will continue Biktarvy and follow-up after lab work in 6 months.        Unprioritized   Nasopharyngeal mass    He had a nasal mass when he presented with epistaxis and was found to be HIV positive several years ago. The mass was probably due to lymphoid hypertrophy related to his infection. I suspect that has  improved with therapy but I will attempt to get him back to see Dr. Warren Danes, the ENT physician who saw him in the hospital.          [name], MD [facility] for [place] [phone] pager   [phone] cell 05/14/2017, 8:20 AM

## 2017-10-07 ENCOUNTER — Ambulatory Visit: Payer: Self-pay

## 2017-10-07 ENCOUNTER — Other Ambulatory Visit: Payer: Self-pay

## 2017-10-07 DIAGNOSIS — B20 Human immunodeficiency virus [HIV] disease: Secondary | ICD-10-CM

## 2017-10-08 LAB — T-HELPER CELL (CD4) - (RCID CLINIC ONLY)
CD4 % Helper T Cell: 16 % — ABNORMAL LOW (ref 33–55)
CD4 T CELL ABS: 250 /uL — AB (ref 400–2700)

## 2017-10-09 LAB — HIV-1 RNA QUANT-NO REFLEX-BLD
HIV 1 RNA QUANT: NOT DETECTED {copies}/mL
HIV-1 RNA QUANT, LOG: NOT DETECTED {Log_copies}/mL

## 2017-10-20 ENCOUNTER — Other Ambulatory Visit: Payer: Self-pay | Admitting: *Deleted

## 2017-10-20 DIAGNOSIS — B20 Human immunodeficiency virus [HIV] disease: Secondary | ICD-10-CM

## 2017-10-20 MED ORDER — BICTEGRAVIR-EMTRICITAB-TENOFOV 50-200-25 MG PO TABS: 1.0000 | ORAL_TABLET | Freq: Every day | ORAL | 5 refills | Status: DC

## 2017-10-20 NOTE — Telephone Encounter (Signed)
patient assistance application

## 2017-10-27 ENCOUNTER — Ambulatory Visit (INDEPENDENT_AMBULATORY_CARE_PROVIDER_SITE_OTHER): Payer: Self-pay | Admitting: Licensed Clinical Social Worker

## 2017-10-27 ENCOUNTER — Other Ambulatory Visit: Payer: Self-pay

## 2017-10-27 ENCOUNTER — Encounter: Payer: Self-pay | Admitting: Internal Medicine

## 2017-10-27 ENCOUNTER — Ambulatory Visit (INDEPENDENT_AMBULATORY_CARE_PROVIDER_SITE_OTHER): Payer: Self-pay | Admitting: Internal Medicine

## 2017-10-27 DIAGNOSIS — F321 Major depressive disorder, single episode, moderate: Secondary | ICD-10-CM

## 2017-10-27 DIAGNOSIS — F32A Depression, unspecified: Secondary | ICD-10-CM | POA: Insufficient documentation

## 2017-10-27 DIAGNOSIS — Z23 Encounter for immunization: Secondary | ICD-10-CM

## 2017-10-27 DIAGNOSIS — F419 Anxiety disorder, unspecified: Secondary | ICD-10-CM

## 2017-10-27 DIAGNOSIS — B20 Human immunodeficiency virus [HIV] disease: Secondary | ICD-10-CM

## 2017-10-27 DIAGNOSIS — F329 Major depressive disorder, single episode, unspecified: Secondary | ICD-10-CM | POA: Insufficient documentation

## 2017-10-27 DIAGNOSIS — J392 Other diseases of pharynx: Secondary | ICD-10-CM

## 2017-10-27 DIAGNOSIS — F17210 Nicotine dependence, cigarettes, uncomplicated: Secondary | ICD-10-CM

## 2017-10-27 NOTE — Assessment & Plan Note (Signed)
His infection has been under excellent, long-term control.  His viral load probably reactivated some when he was off recently but he should suppressed quickly again.  He will follow-up after lab work in 6 months.

## 2017-10-27 NOTE — Assessment & Plan Note (Signed)
Sande Rives, our behavioral health counselor today.

## 2017-10-27 NOTE — Assessment & Plan Note (Signed)
We will start the process of getting him and arrange card so he can be referred back to Dr. Erik Obey.

## 2017-10-27 NOTE — Progress Notes (Signed)
Patient Active Problem List   Diagnosis Date Noted  . Positive hepatitis C antibody test 11/18/2013    Priority: High  . Human immunodeficiency virus (HIV) disease ([place]) 11/17/2013    Priority: High  . Depression 10/27/2017  . Cigarette smoker 12/08/2013  . Thrombocytopenia, unspecified ([place]) 11/17/2013  . Epistaxis 11/17/2013  . Normocytic anemia 11/17/2013  . Chronic otitis media 11/17/2013  . Transaminitis 11/17/2013  . 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   LORATADINE (CLARITIN) 10 MG TABLET    Take 1 tablet (10 mg total) by mouth daily.  Modified Medications   No medications on file  Discontinued Medications   No medications on file    Subjective: [name] is in for his routine HIV follow-up visit.  Recently, he was out of his Biktarvy for 3 weeks after he did not recertify for ADAP.  He restarted yesterday.  When I asked him why he had not recertified he says "I just hate coming here".  He has not had any other missed doses he continues to have a lot of problems with sinus congestion, especially on his left side.  He still occasionally has flecks of blood from both nostrils when he blows his nose very hard.  He has not been able to get back to see Dr. Erik Obey who did his initial ENT evaluation in 2015.  He has quit smoking cigarettes.  Review of Systems: Review of Systems  Constitutional: Negative for chills, diaphoresis, fever, malaise/fatigue and weight loss.  HENT: Positive for congestion and nosebleeds. Negative for sore throat.   Respiratory: Negative for cough, sputum production and shortness of breath.   Cardiovascular: Negative for chest pain.  Gastrointestinal: Negative for abdominal pain, diarrhea, heartburn, nausea and vomiting.  Genitourinary: Negative for  dysuria and frequency.  Musculoskeletal: Negative for joint pain and myalgias.  Skin: Negative for rash.  Neurological: Negative for dizziness and headaches.  Psychiatric/Behavioral: Negative for depression and substance abuse. The patient is not nervous/anxious.     Past Medical History:  Diagnosis Date  . ITP (idiopathic thrombocytopenic purpura) 11/24/2013    Social History   Tobacco Use  . Smoking status: Former Smoker    Packs/day: 0.10    Years: 12.00    Pack years: 1.20    Types: Cigarettes    Start date: 12/30/2001    Last attempt to quit: 06/26/2014    Years since quitting: 3.3  . Smokeless tobacco: Never Used  . Tobacco comment: pt states he wants to quit by his birthday in July  Substance Use Topics  . Alcohol use: No  . Drug use: No    No family history on file.  No Known Allergies  Health Maintenance  Topic Date Due  . TETANUS/TDAP  04/15/2003  . INFLUENZA VACCINE  05/06/2017  . HIV Screening  Completed    Objective:  Vitals:   10/27/17 0916  BP: 132/87  Pulse: 64  Temp: 98.2 F (36.8 C)  TempSrc: Oral  Weight: 210 lb (95.3 kg)  Height: 6\' 2"  (1.88 m)   Body mass index is 26.96 kg/m.  Physical Exam  Constitutional: He is oriented to person, place, and time. No distress.  HENT:  Mouth/Throat: No oropharyngeal exudate.  Eyes: Conjunctivae are normal.  Cardiovascular: Normal rate and regular  rhythm.  No murmur heard. Pulmonary/Chest: Breath sounds normal.  Abdominal: Soft. He exhibits no mass. There is no tenderness.  Musculoskeletal: Normal range of motion.  Neurological: He is alert and oriented to person, place, and time.  Skin: No rash noted.  Psychiatric: Mood and affect normal.    Lab Results Lab Results  Component Value Date   WBC 5.3 04/22/2017   HGB 13.5 04/22/2017   HCT 39.9 04/22/2017   MCV 87.9 04/22/2017   PLT 133 (L) 04/22/2017    Lab Results  Component Value Date   CREATININE 1.44 (H) 04/22/2017   BUN 19  04/22/2017   NA 139 04/22/2017   K 3.9 04/22/2017   CL 105 04/22/2017   CO2 25 04/22/2017    Lab Results  Component Value Date   ALT 12 04/22/2017   AST 22 04/22/2017   ALKPHOS 84 04/22/2017   BILITOT 0.5 04/22/2017    Lab Results  Component Value Date   CHOL 188 04/22/2017   HDL 43 04/22/2017   LDLCALC 105 (H) 04/22/2017   TRIG 199 (H) 04/22/2017   CHOLHDL 4.4 04/22/2017   Lab Results  Component Value Date   LABRPR NON REAC 04/22/2017   HIV 1 RNA Quant (copies/mL)  Date Value  10/07/2017 <20 NOT DETECTED  04/22/2017 <20 DETECTED (A)  12/12/2015 <20   CD4 T Cell Abs (/uL)  Date Value  10/07/2017 250 (L)  04/22/2017 350 (L)  12/12/2015 490     Problem List Items Addressed This Visit      High   Human immunodeficiency virus (HIV) disease ([place])    His infection has been under excellent, long-term control.  His viral load probably reactivated some when he was off recently but he should suppressed quickly again.  He will follow-up after lab work in 6 months.      Relevant Orders   T-helper cell (CD4)- ([facility] clinic only)   HIV 1 RNA quant-no reflex-bld   CBC   Comprehensive metabolic panel   Lipid panel   RPR     Unprioritized   Cigarette smoker    I congratulated him on quitting smoking.      Depression    Sande Rives, our behavioral health counselor today.      Nasopharyngeal mass    We will start the process of getting him and arrange card so he can be referred back to Dr. Erik Obey.           [name], MD [facility] for Infectious [place] [facility] [phone] pager   [phone] cell 10/27/2017, 9:40 AM

## 2017-10-27 NOTE — Assessment & Plan Note (Signed)
I congratulated him on quitting smoking.

## 2017-10-27 NOTE — Addendum Note (Signed)
Addended by: [name] on: 10/27/2017 10:46 AM   Modules accepted: Orders

## 2017-10-28 NOTE — BH Specialist Note (Signed)
Integrated Behavioral Health Initial Visit  MRN: [id number] Name: [name]  Number of [place] Clinician visits:: 1/6 Session Start time: 9:38 am  Session End time: 9:57 am Total time: 20 minutes  Type of Service: Central Interpretor:No. Interpretor Name and Language: N/A   Warm Hand Off Completed.       SUBJECTIVE: [name] is a 32 y.o. male accompanied by self Patient was referred by Dr. Megan Sims for depression.  Patient reports the following symptoms/concerns: Patient stated that he is well adjusted to HIV and reported that he does not think about having HIV but coming to the clinic for treatment is a reminder of the disease.  Patient stated that he becomes anxious when coming for an appointment and sits in the car before walking in and looks around to see who else is around.  Patient stated that he feels everyone knows "what the building is for" and knows he is walking in to get treatment for HIV, feeling judged.  [facility] provided education about the medical offices in the building and about the clinic, informing that the clinic treats many other illnesses besides HIV.  Patient stated that he did not know that and felt better after knowing.    Patient also reported that he has a nasal condition, that he feels is related to HIV, that makes breathing more difficult and has resulted in surgeries.  Patient stated that it affects his voice and breathing and causes pain.  Patient stated that even talking about it causes increased anxiety, but is also helpful because he doesn't have anyone to talk about it with.  [facility] introduced services and offered follow-up session to continue to process anxieties and patient was receptive.  Duration of problem: couple years; Severity of problem: mild  OBJECTIVE: Mood: Anxious and Affect: within range Risk of harm to self or others: No plan to harm self or others  LIFE CONTEXT: Family and  Social: Patient lives with his mother in [place] but is about to get his own apartment in [place].  Patient reported having a good support system. School/Work: Patient works Biochemist, clinical. Self-Care: Patient is able to tend to his ADL's.  ASSESSMENT: Patient is currently experiencing anxiety symptoms due to having nasal condition and anxieties about being judged for having HIV.  Patient may benefit from behavioral health services to learn coping strategies.  GOALS ADDRESSED: Patient will: 1. Reduce symptoms of: anxiety 2. Increase knowledge and/or ability of: coping skills, self-management skills and stress reduction  3. Demonstrate ability to: Increase functioning  INTERVENTIONS: Interventions utilized: Solution-Focused Strategies   PLAN: 1. Follow up with behavioral health clinician on : Patient is willing to schedule follow-up appointment with the [facility] but due to distance thought it would be good to wait until he moves to [place]. 2. Behavioral recommendations: Upon entering the building for treatment, the patient will remind himself that the clinic treats multiple diseases and no one knows that he is being treated for HIV.   [name], [facility]

## 2017-11-03 ENCOUNTER — Ambulatory Visit: Payer: Self-pay

## 2017-11-04 ENCOUNTER — Ambulatory Visit (INDEPENDENT_AMBULATORY_CARE_PROVIDER_SITE_OTHER): Payer: Self-pay | Admitting: Internal Medicine

## 2017-11-04 ENCOUNTER — Other Ambulatory Visit: Payer: Self-pay

## 2017-11-04 VITALS — BP 129/98 | HR 58 | Temp 97.8°F | Ht 73.9 in | Wt 208.8 lb

## 2017-11-04 DIAGNOSIS — J392 Other diseases of pharynx: Secondary | ICD-10-CM

## 2017-11-04 DIAGNOSIS — R040 Epistaxis: Secondary | ICD-10-CM

## 2017-11-04 DIAGNOSIS — R0981 Nasal congestion: Secondary | ICD-10-CM

## 2017-11-04 DIAGNOSIS — B20 Human immunodeficiency virus [HIV] disease: Secondary | ICD-10-CM

## 2017-11-04 DIAGNOSIS — Z0189 Encounter for other specified special examinations: Secondary | ICD-10-CM

## 2017-11-04 DIAGNOSIS — Z0000 Encounter for general adult medical examination without abnormal findings: Secondary | ICD-10-CM | POA: Insufficient documentation

## 2017-11-04 DIAGNOSIS — D696 Thrombocytopenia, unspecified: Secondary | ICD-10-CM

## 2017-11-04 DIAGNOSIS — Z23 Encounter for immunization: Secondary | ICD-10-CM

## 2017-11-04 DIAGNOSIS — Z79899 Other long term (current) drug therapy: Secondary | ICD-10-CM

## 2017-11-04 NOTE — Progress Notes (Signed)
   CC: new patient to establish care in our clinic  HPI:  [name] is a 32 y.o. male with PMH of HIV (10/2017 viral load undetectable, CD4 250) and thrombocytopenia who presents as a new patient to establish care in our clinic.  He was referred to [place] by ID clinic. He states he has never had a PCP before.  HIV: is currently on Biktarvy. Follows with ID clinic regularly. Last HIV labs in 10/2017 showed undetectable viral load and CD4 count of 250. He states he was off the for about 3 weeks prior to this, however he restarted this and now reports compliance daily.  Epistaxis: has intermittent episodes of epistaxis when blowing his nose. Denies hematuria or hematochezia. Denies new rashes.  Congestion: he endorses intermittent chronic shortness of breath at rest as well as chronic congestion. He denies chest pain or palpitations. He has tried loratadine, over the counters, and several nasal sprays with no improvement in his symptoms. He was found have a nasopharyngeal mass in 2015, which was possibly lymphoid hypertrophy from HIV. He has not followed up with ENT regarding this mass.  He does not have insurance and is going to start the process of getting the [employer].  Past Medical History:  Diagnosis Date  . ITP (idiopathic thrombocytopenic purpura) 11/24/2013   Review of Systems:   GEN: Negative for fevers or weight loss CV: Negative for chest pain or palpitations PULM: Positive for intermittent SOB at rest and congestion. ABD: Negative for abdominal pain or blood in stool MSK: Negative for leg swelling GU: Negative for dysuria or hematuria SKIN: Negative for rashes HEME: Positive for occasional limited epistaxis, no other significant bleeds. PSYCH: Denies SI/HI  Family History: - Negative for HTN, diabetes, cancer  Social History: - denies tobacco use - denies alcohol use - denies current marijuana use, used to smoke marijuana, quit 4 months ago - currently living with  dad, about to move into a new place on his own - not currently sexually active - sexual partners are male and male - uses condoms 100% of the time  Medications: - Biktarvy. Reports compliance with this medication  Physical Exam: Vitals:   11/04/17 1108  BP: (!) 129/98  Pulse: (!) 58  Temp: 97.8 F (36.6 C)  TempSrc: Oral  SpO2: 100%  Weight: 208 lb 12.8 oz (94.7 kg)  Height: 6' 1.9" (1.877 m)   GEN: Young, well-appearing male sitting in chair. Alert and oriented. No acute distress.  HENT: [place]/AT. Moist mucous membranes. No visible lesions. No petechiae. EYES: PERRL. Sclera non-icteric. Conjunctiva clear. RESP: Clear to auscultation bilaterally. No wheezes, rales, or rhonchi. No increased work of breathing. CV: Normal rate and regular rhythm. No murmurs, gallops, or rubs. No LE edema. ABD: Soft. Non-tender. Non-distended. Normoactive bowel sounds. EXT: Digital clubbing. No edema. Warm and well perfused. NEURO: Cranial nerves II-XII grossly intact. Able to lift all four extremities against gravity. No apparent audiovisual hallucinations. Speech fluent and appropriate. PSYCH: Patient is calm and pleasant. Appropriate affect. Well-groomed; speech is appropriate and on-subject.  Assessment & Plan:   See Encounters Tab for problem based charting.  Patient discussed with Dr. Lynnae January

## 2017-11-04 NOTE — Assessment & Plan Note (Addendum)
Assessment Seen by Dr. Marin Olp (Oncology) in 2015. Treated with IVIG for low platelets in the hospital at that time, and was diagnosed with HIV. Work-up included: - PTT slightly elevated, vWF elevated - Blood smear: Good maturation of his WBCs without atypical lymphocytes. No immature myeloid cells, no hypersegmented polys. Red cells but showed no schistocytes. He has no nucleated red cells. No target cells. Platelets are decreased in number. There are several large platelets. - CT C/A/P showed no underlying malignancy except for nasopharyngeal mass with pathology consistent with lymphoid hyperplasia - No bone marrow biopsy done at that time due to low probability of bone marrow disorder  Thrombocytopenia may be HIV-related thrombocytopenia or ITP. He has now restarted his Biktarvy after being off of it for several weeks. Plt count 133 in 04/2017 and has mild epistaxis but no other evidence of bleeding. Will need monitoring of his platelet count. I discussed this with the patient. Discussed that he does not need medication now (except for continuing his HIV medication) but if he develops significant bleeding, to present to the emergency room.  Plan - Intermittent CBCs for monitoring of platelet count - Continue Biktarvy - Advised patient to present to ER if significant bleeding

## 2017-11-04 NOTE — Assessment & Plan Note (Signed)
Tetanus vaccination Patient states he had a tetanus vaccination when he was a child but cannot remember the last time he had one. Agreeable to vaccination today. - Tetanus vaccination administered in clinic today

## 2017-11-04 NOTE — Assessment & Plan Note (Signed)
Assessment Initially seen by Dr. Erik Obey (ENT) in 2263. Found to have a nasopharyngeal mass with pathology showing lymphoid hyperplasia. Patient continues to endorse mild intermittent epistaxis and chronic congestion. Patient is in the process of getting the [facility], so we will await this process prior to placing a referral back to ENT.  Plan - Patient advised to try to get the paperwork together for [facility] as soon as possible - Referral to ENT once he has the [employer]

## 2017-11-04 NOTE — Assessment & Plan Note (Signed)
Assessment Intermittent episodes of limited epistaxis after blowing his nose. No other evidence of bleeding. He does have low platelets, possibly ITP? Last plt count 133. No rash or petechiae on exam. He also does have chronic congestion and a nasopharygneal mass that was found in 2015. He has not been able to follow up with ENT since then. Could be related to low platelets vs nasopharyngeal mass.  Plan - Patient advised of precautions for significant bleeding - Will need referral to ENT for f/u of nasopharyngeal mass once he gets the [employer] card

## 2017-11-04 NOTE — Patient Instructions (Addendum)
FOLLOW-UP INSTRUCTIONS When: 6 months For: health maintenance What to bring: medications   [name],  It was a pleasure to meet you today.  Please start the process of getting the orange card. Once this is done, we can send a referral for the Ear, Nose, and Throat doctors to see if they can help with the chronic congestion that you have.  Please continue to follow up with the Infectious Disease doctors as scheduled.  You received a tetanus shot today.  If you start to have significant bleeding or a funny rash on your body, please go to the emergency room.  Please return to our clinic in 6 months or sooner if needed.    Idiopathic Thrombocytopenic Purpura Idiopathic thrombocytopenic purpura (ITP) is a disease in which your body's defense system (immune system) attacks your platelets. Platelets are blood cells that help form clots and seal leaks in damaged blood vessels. With ITP, you to have too few platelets. As a result, you bleed more easily. It is also harder for your body to stop any bleeding. In adults, ITP is usually a long-term disease. What are the causes? The cause is unknown. ITP may develop after a viral infection, during pregnancy, or from an immune disorder. What increases the risk? The risk of ITP may be greater among:  Women.  Adults 50-76 years old.  What are the signs or symptoms? Common signs and symptoms include:  Easy bruising.  A cut that bleeds for a long time.  Tiny purple blood dots (petechiae) under the skin, especially on the shins.  Blood in urine or bowel movements.  Nosebleeds.  Bleeding gums.  Heavy menstrual periods.  Mild forms of ITP may not cause any symptoms. How is this diagnosed? Your health care provider may suspect ITP based on your signs and symptoms. To make a diagnosis, your health care provider may do a physical exam and order blood tests to:  Find how many platelets you have.  See how well your blood clots.  Your  health care provider may then do blood tests or a bone marrow test to rule out other conditions that could be causing your symptoms. How is this treated? Treatment depends on the severity of your condition. Options include:  Monitoring of your condition and platelet count.  Blood transfusions of antibodies or platelets.  Medicines such as: ? Strong anti-inflammatory medicines (steroids). ? Medicines to boost platelet production. ? Medicines to suppress your immune system.  Removal of your spleen. This may be done if other treatments do not work.  Follow these instructions at home:  Take medicines only as directed by your health care provider.  Do not take over-the-counter medicines that lower your platelet count, affect platelet function, or affect your blood's ability to clot. These include aspirin and ibuprofen.  Do not participate in contact sports or other high-risk activities. Ask your health care provider which activities are safe for you.  Keep all follow-up visits as directed by your health care provider. This is important. Contact a health care provider if:  You have new symptoms.  Your symptoms get worse. Get help right away if:  You have a sudden severe headache or confusion.  You have significant bleeding.  You have nausea and vomiting. This information is not intended to replace advice given to you by your health care provider. Make sure you discuss any questions you have with your health care provider. Document Released: 04/19/2014 Document Revised: 02/28/2016 Document Reviewed: 02/09/2014 Elsevier Interactive Patient Education  2018 Elsevier Inc.  

## 2017-11-05 NOTE — Progress Notes (Signed)
Internal Medicine Clinic Attending  Case discussed with Dr. Huang at the time of the visit.  We reviewed the resident's history and exam and pertinent patient test results.  I agree with the assessment, diagnosis, and plan of care documented in the resident's note. 

## 2017-11-12 ENCOUNTER — Ambulatory Visit: Payer: Self-pay

## 2017-11-27 ENCOUNTER — Encounter: Payer: Self-pay | Admitting: Internal Medicine

## 2017-12-24 ENCOUNTER — Other Ambulatory Visit: Payer: Self-pay | Admitting: Pharmacist

## 2017-12-24 DIAGNOSIS — B20 Human immunodeficiency virus [HIV] disease: Secondary | ICD-10-CM

## 2017-12-24 MED ORDER — BICTEGRAVIR-EMTRICITAB-TENOFOV 50-200-25 MG PO TABS: 1.0000 | ORAL_TABLET | Freq: Every day | ORAL | 5 refills | Status: DC

## 2018-02-11 ENCOUNTER — Ambulatory Visit: Payer: Self-pay

## 2018-02-12 ENCOUNTER — Ambulatory Visit: Payer: Self-pay

## 2018-02-15 ENCOUNTER — Ambulatory Visit: Payer: Self-pay

## 2018-02-16 ENCOUNTER — Ambulatory Visit: Payer: Self-pay

## 2018-03-02 ENCOUNTER — Ambulatory Visit: Payer: Self-pay

## 2018-04-20 ENCOUNTER — Ambulatory Visit: Payer: Self-pay

## 2018-05-17 ENCOUNTER — Ambulatory Visit: Payer: Self-pay

## 2018-05-27 ENCOUNTER — Ambulatory Visit: Payer: Self-pay

## 2018-06-03 ENCOUNTER — Other Ambulatory Visit: Payer: Self-pay

## 2018-06-03 DIAGNOSIS — B20 Human immunodeficiency virus [HIV] disease: Secondary | ICD-10-CM

## 2018-06-04 ENCOUNTER — Ambulatory Visit: Payer: Self-pay

## 2018-06-04 LAB — T-HELPER CELL (CD4) - (RCID CLINIC ONLY)
CD4 T CELL ABS: 360 /uL — AB (ref 400–2700)
CD4 T CELL HELPER: 20 % — AB (ref 33–55)

## 2018-06-05 LAB — COMPREHENSIVE METABOLIC PANEL
AG Ratio: 1.2 (calc) (ref 1.0–2.5)
ALT: 24 U/L (ref 9–46)
AST: 23 U/L (ref 10–40)
Albumin: 4.2 g/dL (ref 3.6–5.1)
Alkaline phosphatase (APISO): 104 U/L (ref 40–115)
BUN / CREAT RATIO: 11 (calc) (ref 6–22)
BUN: 17 mg/dL (ref 7–25)
CO2: 29 mmol/L (ref 20–32)
CREATININE: 1.59 mg/dL — AB (ref 0.60–1.35)
Calcium: 9.6 mg/dL (ref 8.6–10.3)
Chloride: 106 mmol/L (ref 98–110)
GLUCOSE: 92 mg/dL (ref 65–99)
Globulin: 3.5 g/dL (calc) (ref 1.9–3.7)
Potassium: 4.1 mmol/L (ref 3.5–5.3)
Sodium: 140 mmol/L (ref 135–146)
Total Bilirubin: 1 mg/dL (ref 0.2–1.2)
Total Protein: 7.7 g/dL (ref 6.1–8.1)

## 2018-06-05 LAB — CBC
HEMATOCRIT: 40.3 % (ref 38.5–50.0)
Hemoglobin: 13.7 g/dL (ref 13.2–17.1)
MCH: 29.4 pg (ref 27.0–33.0)
MCHC: 34 g/dL (ref 32.0–36.0)
MCV: 86.5 fL (ref 80.0–100.0)
MPV: 12.4 fL (ref 7.5–12.5)
Platelets: 129 10*3/uL — ABNORMAL LOW (ref 140–400)
RBC: 4.66 10*6/uL (ref 4.20–5.80)
RDW: 13.1 % (ref 11.0–15.0)
WBC: 4.8 10*3/uL (ref 3.8–10.8)

## 2018-06-05 LAB — LIPID PANEL
CHOL/HDL RATIO: 4.6 (calc) (ref <5.0)
Cholesterol: 153 mg/dL (ref <200)
HDL: 33 mg/dL — ABNORMAL LOW (ref >40)
LDL CHOLESTEROL (CALC): 101 mg/dL — AB
NON-HDL CHOLESTEROL (CALC): 120 mg/dL (ref <130)
TRIGLYCERIDES: 98 mg/dL (ref <150)

## 2018-06-05 LAB — RPR: RPR Ser Ql: NONREACTIVE

## 2018-06-05 LAB — HIV-1 RNA QUANT-NO REFLEX-BLD
HIV 1 RNA Quant: <20 copies/mL
HIV-1 RNA Quant, Log: <1.3 Log copies/mL

## 2018-06-15 DIAGNOSIS — N289 Disorder of kidney and ureter, unspecified: Secondary | ICD-10-CM | POA: Insufficient documentation

## 2018-06-16 ENCOUNTER — Ambulatory Visit: Payer: Self-pay | Admitting: Internal Medicine

## 2018-06-23 ENCOUNTER — Ambulatory Visit: Payer: Self-pay

## 2018-08-31 ENCOUNTER — Other Ambulatory Visit: Payer: Self-pay

## 2018-08-31 ENCOUNTER — Telehealth: Payer: Self-pay

## 2018-08-31 DIAGNOSIS — B20 Human immunodeficiency virus [HIV] disease: Secondary | ICD-10-CM

## 2018-08-31 MED ORDER — BICTEGRAVIR-EMTRICITAB-TENOFOV 50-200-25 MG PO TABS: 1.0000 | ORAL_TABLET | Freq: Every day | ORAL | 0 refills | Status: DC

## 2018-08-31 NOTE — Telephone Encounter (Signed)
Patient called office to set up appointments for ADAP renewal, lab, and office visit. Patient denies missing any days of medication, and has two weeks of medication left on hand. Scheduled an appointment for patient to do ADAP and labs on 11/27. Patient will come into office in December to see Dr. Megan Salon. Patient also states he will be moving out of [place] in February. [place]

## 2018-09-01 ENCOUNTER — Other Ambulatory Visit: Payer: Self-pay

## 2018-09-01 ENCOUNTER — Ambulatory Visit: Payer: Self-pay

## 2018-09-01 ENCOUNTER — Encounter: Payer: Self-pay | Admitting: Internal Medicine

## 2018-09-01 ENCOUNTER — Other Ambulatory Visit: Payer: Self-pay | Admitting: Behavioral Health

## 2018-09-01 DIAGNOSIS — Z113 Encounter for screening for infections with a predominantly sexual mode of transmission: Secondary | ICD-10-CM

## 2018-09-01 DIAGNOSIS — B20 Human immunodeficiency virus [HIV] disease: Secondary | ICD-10-CM

## 2018-09-01 NOTE — Addendum Note (Signed)
Addended by: Alberteen Sam on: 09/01/2018 09:28 AM   Modules accepted: Orders

## 2018-09-03 LAB — T-HELPER CELL (CD4) - (RCID CLINIC ONLY)
CD4 % Helper T Cell: 18 % — ABNORMAL LOW (ref 33–55)
CD4 T Cell Abs: 350 /uL — ABNORMAL LOW (ref 400–2700)

## 2018-09-03 LAB — URINE CYTOLOGY ANCILLARY ONLY
Chlamydia: NEGATIVE
NEISSERIA GONORRHEA: NEGATIVE

## 2018-09-07 LAB — COMPREHENSIVE METABOLIC PANEL
AG RATIO: 1.2 (calc) (ref 1.0–2.5)
ALBUMIN MSPROF: 4.1 g/dL (ref 3.6–5.1)
ALT: 11 U/L (ref 9–46)
AST: 20 U/L (ref 10–40)
Alkaline phosphatase (APISO): 94 U/L (ref 40–115)
BILIRUBIN TOTAL: 0.8 mg/dL (ref 0.2–1.2)
BUN/Creatinine Ratio: 11 (calc) (ref 6–22)
BUN: 17 mg/dL (ref 7–25)
CALCIUM: 9.3 mg/dL (ref 8.6–10.3)
CO2: 26 mmol/L (ref 20–32)
Chloride: 104 mmol/L (ref 98–110)
Creat: 1.53 mg/dL — ABNORMAL HIGH (ref 0.60–1.35)
GLOBULIN: 3.4 g/dL (ref 1.9–3.7)
Glucose, Bld: 89 mg/dL (ref 65–99)
POTASSIUM: 3.9 mmol/L (ref 3.5–5.3)
Sodium: 140 mmol/L (ref 135–146)
TOTAL PROTEIN: 7.5 g/dL (ref 6.1–8.1)

## 2018-09-07 LAB — CBC WITH DIFFERENTIAL/PLATELET
BASOS PCT: 0.8 %
Basophils Absolute: 51 cells/uL (ref 0–200)
EOS PCT: 0.9 %
Eosinophils Absolute: 58 cells/uL (ref 15–500)
HCT: 41.1 % (ref 38.5–50.0)
Hemoglobin: 14 g/dL (ref 13.2–17.1)
LYMPHS ABS: 1875 {cells}/uL (ref 850–3900)
MCH: 29.6 pg (ref 27.0–33.0)
MCHC: 34.1 g/dL (ref 32.0–36.0)
MCV: 86.9 fL (ref 80.0–100.0)
MPV: 12.3 fL (ref 7.5–12.5)
Monocytes Relative: 9.7 %
Neutro Abs: 3795 cells/uL (ref 1500–7800)
Neutrophils Relative %: 59.3 %
PLATELETS: 143 10*3/uL (ref 140–400)
RBC: 4.73 10*6/uL (ref 4.20–5.80)
RDW: 13.2 % (ref 11.0–15.0)
TOTAL LYMPHOCYTE: 29.3 %
WBC mixed population: 621 cells/uL (ref 200–950)
WBC: 6.4 10*3/uL (ref 3.8–10.8)

## 2018-09-07 LAB — HIV-1 RNA QUANT-NO REFLEX-BLD
HIV 1 RNA Quant: <20 copies/mL
HIV-1 RNA Quant, Log: <1.3 Log copies/mL

## 2018-09-14 ENCOUNTER — Ambulatory Visit: Payer: Self-pay | Admitting: Internal Medicine

## 2018-09-16 ENCOUNTER — Ambulatory Visit (INDEPENDENT_AMBULATORY_CARE_PROVIDER_SITE_OTHER): Payer: Self-pay | Admitting: Internal Medicine

## 2018-09-16 ENCOUNTER — Encounter: Payer: Self-pay | Admitting: Internal Medicine

## 2018-09-16 DIAGNOSIS — J392 Other diseases of pharynx: Secondary | ICD-10-CM

## 2018-09-16 DIAGNOSIS — D696 Thrombocytopenia, unspecified: Secondary | ICD-10-CM

## 2018-09-16 DIAGNOSIS — B20 Human immunodeficiency virus [HIV] disease: Secondary | ICD-10-CM

## 2018-09-16 DIAGNOSIS — Z23 Encounter for immunization: Secondary | ICD-10-CM

## 2018-09-16 NOTE — Assessment & Plan Note (Signed)
His infection remains under excellent long-term control and he is having gradual CD4 reconstitution.  Fortunately his recent missed doses have not caused any problem.  I talked to him about ways to help him remember to take his medication.  He will change back to taking it about 10 AM each morning when he first gets up.  He will follow-up after lab work in 6 months.  He received his influenza vaccine today.

## 2018-09-16 NOTE — Assessment & Plan Note (Signed)
When he was first diagnosed with HIV infection in 2015 he was admitted to the hospital with epistaxis.  CT scan showed marked enlargement of his adenoids.  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  Once he has insurance we will make a referral back to see Dr. Erik Obey.

## 2018-09-16 NOTE — Assessment & Plan Note (Signed)
His HIV related thrombocytopenia has resolved with treatment and he is no longer having nosebleeds.

## 2018-09-16 NOTE — Progress Notes (Signed)
Patient Active Problem List   Diagnosis Date Noted  . Positive hepatitis C antibody test 11/18/2013    Priority: High  . Human immunodeficiency virus (HIV) disease ([place]) 11/17/2013    Priority: High  . Renal insufficiency 06/15/2018  . Healthcare maintenance 11/04/2017  . Depression 10/27/2017  . Cigarette smoker 12/08/2013  . Thrombocytopenia, unspecified ([place]) 11/17/2013  . Epistaxis 11/17/2013  . Normocytic anemia 11/17/2013  . Chronic otitis media 11/17/2013  . Transaminitis 11/17/2013  . 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  Modified Medications   No medications on file  Discontinued Medications   No medications on file    Subjective: [name] is in for his routine follow-up visit.  He has had no problems obtaining, taking or tolerating his Biktarvy but he has been concerned recently because he has missed some doses.  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  He enjoys his work.  He goes to work about noon.  He used to take his Biktarvy around 10 AM shortly after he woke up but several months ago he started taking it in the evening.  Over the last 2 months he estimates that he is missed about 7 doses when he got distracted or fell asleep before taking it.  This is made him somewhat anxious and nervous.  He has not had any further problems with intermittent epistaxis but he is bothered by chronic congestion.  He does not feel like he has any symptoms to suggest a seasonal allergies.  He has not been able to get back to see Dr. Erik Sims, his ENT doctor, because he does not have insurance.  He will be getting insurance in January.  Review of Systems: Review of Systems  Constitutional: Negative for chills, diaphoresis, fever,  malaise/fatigue and weight loss.  HENT: Positive for congestion. Negative for nosebleeds and sore throat.   Respiratory: Negative for cough, sputum production and shortness of breath.   Cardiovascular: Negative for chest pain.  Gastrointestinal: Negative for abdominal pain, diarrhea, heartburn, nausea and vomiting.  Genitourinary: Negative for dysuria and frequency.  Musculoskeletal: Negative for joint pain and myalgias.  Skin: Negative for rash.  Neurological: Negative for dizziness and headaches.  Psychiatric/Behavioral: Negative for depression and substance abuse. The patient is nervous/anxious.     Past Medical History:  Diagnosis Date  . HIV (human immunodeficiency virus infection) ([place])   . ITP (idiopathic thrombocytopenic purpura) 11/24/2013    Social History   Tobacco Use  . Smoking status: Former Smoker    Packs/day: 0.10    Years: 12.00    Pack years: 1.20    Types: Cigarettes    Start date: 12/30/2001    Last attempt to quit: 06/26/2014    Years since quitting: 4.2  . Smokeless tobacco: Never Used  . Tobacco comment: pt states he wants to quit by his birthday in July  Substance Use Topics  . Alcohol use: No  . Drug use: No    Family History  Problem Relation Age of Onset  . Hypertension Neg Hx   . Diabetes Neg Hx     No Known Allergies  Health Maintenance  Topic Date Due  . INFLUENZA VACCINE  05/06/2018  . TETANUS/TDAP  11/05/2027  .  HIV Screening  Completed    Objective:  Vitals:   09/16/18 1054  BP: 134/87  Pulse: 69  Temp: 98.7 F (37.1 C)  Weight: 214 lb (97.1 kg)   Body mass index is 27.55 kg/m.  Physical Exam Constitutional:      Comments: He is quiet and reserved as usual but in good spirits.  HENT:     Mouth/Throat:     Pharynx: No oropharyngeal exudate.  Eyes:     Conjunctiva/sclera: Conjunctivae normal.  Cardiovascular:     Rate and Rhythm: Normal rate and regular rhythm.     Heart sounds: No murmur.  Pulmonary:     Effort:  Pulmonary effort is normal.     Breath sounds: Normal breath sounds.  Abdominal:     Palpations: Abdomen is soft. There is no mass.     Tenderness: There is no abdominal tenderness.  Musculoskeletal: Normal range of motion.  Skin:    Findings: No rash.  Neurological:     Mental Status: He is alert and oriented to person, place, and time.  Psychiatric:        Mood and Affect: Mood normal.     Lab Results Lab Results  Component Value Date   WBC 6.4 09/01/2018   HGB 14.0 09/01/2018   HCT 41.1 09/01/2018   MCV 86.9 09/01/2018   PLT 143 09/01/2018    Lab Results  Component Value Date   CREATININE 1.53 (H) 09/01/2018   BUN 17 09/01/2018   NA 140 09/01/2018   K 3.9 09/01/2018   CL 104 09/01/2018   CO2 26 09/01/2018    Lab Results  Component Value Date   ALT 11 09/01/2018   AST 20 09/01/2018   ALKPHOS 84 04/22/2017   BILITOT 0.8 09/01/2018    Lab Results  Component Value Date   CHOL 153 06/03/2018   HDL 33 (L) 06/03/2018   LDLCALC 101 (H) 06/03/2018   TRIG 98 06/03/2018   CHOLHDL 4.6 06/03/2018   Lab Results  Component Value Date   LABRPR NON-REACTIVE 06/03/2018   HIV 1 RNA Quant (copies/mL)  Date Value  09/01/2018 <20 NOT DETECTED  06/03/2018 <20 NOT DETECTED  10/07/2017 <20 NOT DETECTED   CD4 T Cell Abs (/uL)  Date Value  09/01/2018 350 (L)  06/03/2018 360 (L)  10/07/2017 250 (L)     Problem List Items Addressed This Visit      High   Human immunodeficiency virus (HIV) disease ([place])    His infection remains under excellent long-term control and he is having gradual CD4 reconstitution.  Fortunately his recent missed doses have not caused any problem.  I talked to him about ways to help him remember to take his medication.  He will change back to taking it about 10 AM each morning when he first gets up.  He will follow-up after lab work in 6 months.  He received his influenza vaccine today.      Relevant Orders   T-helper cell (CD4)- ([facility] clinic only)    HIV-1 RNA quant-no reflex-bld   CBC   Comprehensive metabolic panel   Lipid panel   RPR     Unprioritized   Thrombocytopenia, unspecified ([place])    His HIV related thrombocytopenia has resolved with treatment and he is no longer having nosebleeds.      Nasopharyngeal mass    When he was first diagnosed with HIV infection in 2015 he was admitted to the hospital with epistaxis.  CT scan showed marked  enlargement of his adenoids.  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  Once he has insurance we will make a referral back to see Dr. Erik Sims.           [name], MD [facility] [phone] pager   [phone] cell 09/16/2018, 11:26 AM

## 2018-10-20 ENCOUNTER — Ambulatory Visit: Payer: Self-pay

## 2018-10-22 ENCOUNTER — Other Ambulatory Visit: Payer: Self-pay | Admitting: Internal Medicine

## 2018-10-22 DIAGNOSIS — B20 Human immunodeficiency virus [HIV] disease: Secondary | ICD-10-CM

## 2018-10-27 ENCOUNTER — Telehealth: Payer: Self-pay

## 2018-10-27 NOTE — Telephone Encounter (Signed)
Patient called office today stating that he needs medication assistance while he wait for his ADAP to be approved. Transferred call to Ronney Asters, Pharmacy Tech to help patient with medication assistance.  [place]

## 2018-10-28 ENCOUNTER — Telehealth: Payer: Self-pay | Admitting: Pharmacy Technician

## 2018-10-28 NOTE — Telephone Encounter (Signed)
I let him know his insurance is active and his med has been filled and to call his pharmacy to make sure before picking up.

## 2018-11-04 DIAGNOSIS — J343 Hypertrophy of nasal turbinates: Secondary | ICD-10-CM | POA: Diagnosis not present

## 2018-12-16 DIAGNOSIS — J343 Hypertrophy of nasal turbinates: Secondary | ICD-10-CM | POA: Diagnosis not present

## 2018-12-20 ENCOUNTER — Emergency Department (HOSPITAL_BASED_OUTPATIENT_CLINIC_OR_DEPARTMENT_OTHER): Payer: BC Managed Care – PPO

## 2018-12-20 ENCOUNTER — Encounter (HOSPITAL_BASED_OUTPATIENT_CLINIC_OR_DEPARTMENT_OTHER): Payer: Self-pay

## 2018-12-20 ENCOUNTER — Emergency Department (HOSPITAL_BASED_OUTPATIENT_CLINIC_OR_DEPARTMENT_OTHER)
Admission: EM | Admit: 2018-12-20 | Discharge: 2018-12-20 | Disposition: A | Discharge status: Home / Self Care | Payer: BC Managed Care – PPO | Attending: Emergency Medicine | Admitting: Emergency Medicine

## 2018-12-20 ENCOUNTER — Other Ambulatory Visit: Payer: Self-pay

## 2018-12-20 DIAGNOSIS — M25561 Pain in right knee: Secondary | ICD-10-CM | POA: Insufficient documentation

## 2018-12-20 DIAGNOSIS — Y9389 Activity, other specified: Secondary | ICD-10-CM | POA: Insufficient documentation

## 2018-12-20 DIAGNOSIS — S0083XA Contusion of other part of head, initial encounter: Secondary | ICD-10-CM | POA: Insufficient documentation

## 2018-12-20 DIAGNOSIS — Z87891 Personal history of nicotine dependence: Secondary | ICD-10-CM | POA: Insufficient documentation

## 2018-12-20 DIAGNOSIS — Z21 Asymptomatic human immunodeficiency virus [HIV] infection status: Secondary | ICD-10-CM | POA: Diagnosis not present

## 2018-12-20 DIAGNOSIS — Y998 Other external cause status: Secondary | ICD-10-CM | POA: Insufficient documentation

## 2018-12-20 DIAGNOSIS — Z23 Encounter for immunization: Secondary | ICD-10-CM | POA: Insufficient documentation

## 2018-12-20 DIAGNOSIS — S50812A Abrasion of left forearm, initial encounter: Secondary | ICD-10-CM | POA: Diagnosis not present

## 2018-12-20 DIAGNOSIS — Y92410 Unspecified street and highway as the place of occurrence of the external cause: Secondary | ICD-10-CM | POA: Diagnosis not present

## 2018-12-20 DIAGNOSIS — Z79899 Other long term (current) drug therapy: Secondary | ICD-10-CM | POA: Diagnosis not present

## 2018-12-20 DIAGNOSIS — S59912A Unspecified injury of left forearm, initial encounter: Secondary | ICD-10-CM | POA: Diagnosis not present

## 2018-12-20 DIAGNOSIS — F329 Major depressive disorder, single episode, unspecified: Secondary | ICD-10-CM | POA: Insufficient documentation

## 2018-12-20 DIAGNOSIS — M25512 Pain in left shoulder: Secondary | ICD-10-CM | POA: Insufficient documentation

## 2018-12-20 DIAGNOSIS — R51 Headache: Secondary | ICD-10-CM | POA: Diagnosis not present

## 2018-12-20 DIAGNOSIS — S0990XA Unspecified injury of head, initial encounter: Secondary | ICD-10-CM | POA: Diagnosis not present

## 2018-12-20 MED ORDER — ACETAMINOPHEN 325 MG PO TABS
650.0000 mg | ORAL_TABLET | Freq: Once | ORAL | Status: AC
  Administered 2018-12-20: 650 mg via ORAL
  Filled 2018-12-20: qty 2

## 2018-12-20 MED ORDER — METHOCARBAMOL 500 MG PO TABS: 500.0000 mg | ORAL_TABLET | Freq: Two times a day (BID) | ORAL | 0 refills | Status: DC

## 2018-12-20 MED ORDER — TETANUS-DIPHTH-ACELL PERTUSSIS 5-2.5-18.5 LF-MCG/0.5 IM SUSP
0.5000 mL | Freq: Once | INTRAMUSCULAR | Status: AC
  Administered 2018-12-20: 0.5 mL via INTRAMUSCULAR
  Filled 2018-12-20: qty 0.5
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
## 2018-12-20 NOTE — Discharge Instructions (Signed)

## 2019-01-21 ENCOUNTER — Telehealth: Payer: Self-pay | Admitting: Pharmacy Technician

## 2019-01-21 NOTE — Telephone Encounter (Signed)
[facility] Patient Advocate Encounter   Was successful in obtaining a [place] copay card for [name].  This copay card will make the patients copay 0.  The patient has been given the copay card information to give to his pharmacy.  The billing information is as follows:  BIN  [id number] PCN  ACCESS GRP [id number] ID   [id number]  [name] [place] [facility] Phone: [phone] Fax:  [phone]

## 2019-01-21 NOTE — Telephone Encounter (Signed)
Called patient, gave him the [place] co-pay card information Patient verified understanding and states he was going to call the pharmacy to give them the information. Pricilla Riffle RN

## 2019-02-14 DIAGNOSIS — J069 Acute upper respiratory infection, unspecified: Secondary | ICD-10-CM | POA: Diagnosis not present

## 2019-02-25 DIAGNOSIS — R030 Elevated blood-pressure reading, without diagnosis of hypertension: Secondary | ICD-10-CM | POA: Diagnosis not present

## 2019-02-25 DIAGNOSIS — R6883 Chills (without fever): Secondary | ICD-10-CM | POA: Diagnosis not present

## 2019-02-25 DIAGNOSIS — R0981 Nasal congestion: Secondary | ICD-10-CM | POA: Diagnosis not present

## 2019-03-17 ENCOUNTER — Telehealth: Payer: Self-pay

## 2019-03-17 NOTE — Telephone Encounter (Signed)
Patient called to inquire about his appointment. Made patient aware that his previous appointment scheduled for 03/18/19 was cancelled due to [facility] being closed on Fridays and made him aware of new appointment date/time. Patient appreciative of advise.  Eugenia Mcalpine, LPN

## 2019-03-18 ENCOUNTER — Other Ambulatory Visit: Payer: Self-pay

## 2019-03-21 ENCOUNTER — Other Ambulatory Visit: Payer: Self-pay

## 2019-03-31 ENCOUNTER — Other Ambulatory Visit: Payer: Self-pay

## 2019-03-31 ENCOUNTER — Other Ambulatory Visit: Payer: BC Managed Care – PPO

## 2019-03-31 DIAGNOSIS — B20 Human immunodeficiency virus [HIV] disease: Secondary | ICD-10-CM | POA: Diagnosis not present

## 2019-04-01 LAB — T-HELPER CELL (CD4) - (RCID CLINIC ONLY)
CD4 % Helper T Cell: 22 % — ABNORMAL LOW (ref 33–65)
CD4 T Cell Abs: 379 /uL — ABNORMAL LOW (ref 400–1790)

## 2019-04-04 ENCOUNTER — Encounter: Payer: Self-pay | Admitting: Internal Medicine

## 2019-04-06 LAB — LIPID PANEL
Cholesterol: 190 mg/dL (ref <200)
HDL: 42 mg/dL (ref >=40)
LDL Cholesterol (Calc): 127 mg/dL (calc) — ABNORMAL HIGH
Non-HDL Cholesterol (Calc): 148 mg/dL (calc) — ABNORMAL HIGH (ref <130)
Total CHOL/HDL Ratio: 4.5 (calc) (ref <5.0)
Triglycerides: 103 mg/dL (ref <150)

## 2019-04-06 LAB — HIV-1 RNA QUANT-NO REFLEX-BLD
HIV 1 RNA Quant: <20 copies/mL
HIV-1 RNA Quant, Log: <1.3 Log copies/mL

## 2019-04-06 LAB — COMPREHENSIVE METABOLIC PANEL
AG Ratio: 1.3 (calc) (ref 1.0–2.5)
ALT: 11 U/L (ref 9–46)
AST: 19 U/L (ref 10–40)
Albumin: 4 g/dL (ref 3.6–5.1)
Alkaline phosphatase (APISO): 77 U/L (ref 36–130)
BUN/Creatinine Ratio: 8 (calc) (ref 6–22)
BUN: 13 mg/dL (ref 7–25)
CO2: 27 mmol/L (ref 20–32)
Calcium: 9.2 mg/dL (ref 8.6–10.3)
Chloride: 105 mmol/L (ref 98–110)
Creat: 1.59 mg/dL — ABNORMAL HIGH (ref 0.60–1.35)
Globulin: 3.2 g/dL (calc) (ref 1.9–3.7)
Glucose, Bld: 88 mg/dL (ref 65–99)
Potassium: 4 mmol/L (ref 3.5–5.3)
Sodium: 139 mmol/L (ref 135–146)
Total Bilirubin: 1.7 mg/dL — ABNORMAL HIGH (ref 0.2–1.2)
Total Protein: 7.2 g/dL (ref 6.1–8.1)

## 2019-04-06 LAB — CBC
HCT: 41.6 % (ref 38.5–50.0)
Hemoglobin: 14.2 g/dL (ref 13.2–17.1)
MCH: 30.1 pg (ref 27.0–33.0)
MCHC: 34.1 g/dL (ref 32.0–36.0)
MCV: 88.3 fL (ref 80.0–100.0)
MPV: 12.3 fL (ref 7.5–12.5)
Platelets: 122 10*3/uL — ABNORMAL LOW (ref 140–400)
RBC: 4.71 10*6/uL (ref 4.20–5.80)
RDW: 13.1 % (ref 11.0–15.0)
WBC: 6.2 10*3/uL (ref 3.8–10.8)

## 2019-04-06 LAB — RPR: RPR Ser Ql: NONREACTIVE

## 2019-04-21 ENCOUNTER — Encounter: Payer: Self-pay | Admitting: Internal Medicine

## 2019-04-21 ENCOUNTER — Other Ambulatory Visit: Payer: Self-pay

## 2019-04-21 ENCOUNTER — Ambulatory Visit (INDEPENDENT_AMBULATORY_CARE_PROVIDER_SITE_OTHER): Payer: BC Managed Care – PPO | Admitting: Internal Medicine

## 2019-04-21 DIAGNOSIS — B20 Human immunodeficiency virus [HIV] disease: Secondary | ICD-10-CM

## 2019-04-21 MED ORDER — BIKTARVY 50-200-25 MG PO TABS: 1.0000 | ORAL_TABLET | Freq: Every day | ORAL | 11 refills | Status: DC

## 2019-04-21 NOTE — Progress Notes (Signed)
Patient Active Problem List   Diagnosis Date Noted  . Positive hepatitis C antibody test 11/18/2013    Priority: High  . Human immunodeficiency virus (HIV) disease ([place]) 11/17/2013    Priority: High  . Renal insufficiency 06/15/2018  . Healthcare maintenance 11/04/2017  . Depression 10/27/2017  . Cigarette smoker 12/08/2013  . Thrombocytopenia, unspecified ([place]) 11/17/2013  . Epistaxis 11/17/2013  . Normocytic anemia 11/17/2013  . Chronic otitis media 11/17/2013  . Transaminitis 11/17/2013  . Nasopharyngeal mass 11/16/2013    Patient's Medications  New Prescriptions   No medications on file  Previous Medications   FLUTICASONE (FLONASE) 50 MCG/ACT NASAL SPRAY    Place into both nostrils daily.   METHOCARBAMOL (ROBAXIN) 500 MG TABLET    Take 1 tablet (500 mg total) by mouth 2 (two) times daily.   OXYMETAZOLINE (AFRIN) 0.05 % NASAL SPRAY    Place 1 spray into both nostrils 2 (two) times daily.  Modified Medications   Modified Medication Previous Medication   BICTEGRAVIR-EMTRICITABINE-TENOFOVIR AF (BIKTARVY) 50-200-25 MG TABS TABLET BIKTARVY 50-200-25 MG TABS tablet      Take 1 tablet by mouth daily.    TAKE 1 TABLET BY MOUTH DAILY  Discontinued Medications   No medications on file    Subjective: [name] is in for his routine HIV follow-up visit.  He has had no problems obtaining, taking or tolerating his Biktarvy never misses a single dose.  He is feeling well.  He is working from home during the [place] pandemic.  He is currently not in a relationship or sexually active.  He is still bothered by sinus congestion. He saw Dr. Melida Quitter and was considering turbinate reduction surgery before the COVID pandemic began.  Review of Systems: Review of Systems  Constitutional: Negative for chills and fever.  HENT: Positive for congestion.   Respiratory: Negative for cough and shortness of breath.   Cardiovascular: Negative for chest pain.  Gastrointestinal: Negative for  abdominal pain, diarrhea, nausea and vomiting.  Psychiatric/Behavioral: Negative for depression and substance abuse.    Past Medical History:  Diagnosis Date  . HIV (human immunodeficiency virus infection) ([place])   . ITP (idiopathic thrombocytopenic purpura) 11/24/2013    Social History   Tobacco Use  . Smoking status: Former Smoker    Packs/day: 0.10    Years: 12.00    Pack years: 1.20    Types: Cigarettes    Start date: 12/30/2001    Quit date: 06/26/2014    Years since quitting: 4.8  . Smokeless tobacco: Never Used  . Tobacco comment: pt states he wants to quit by his birthday in July  Substance Use Topics  . Alcohol use: No  . Drug use: No    Family History  Problem Relation Age of Onset  . Hypertension Neg Hx   . Diabetes Neg Hx     No Known Allergies  Health Maintenance  Topic Date Due  . INFLUENZA VACCINE  05/07/2019  . TETANUS/TDAP  12/19/2028  . HIV Screening  Completed    Objective:  Vitals:   04/21/19 1423 04/21/19 1426  BP: 131/88 131/88  Pulse: 85 85  Temp: 98 F (36.7 C) 98 F (36.7 C)  Weight: 218 lb (98.9 kg) 212 lb (96.2 kg)  Height: 6\' 1"  (1.854 m)    Body mass index is 27.97 kg/m.  Physical Exam Constitutional:      Comments: He is soft-spoken and pleasant.  Cardiovascular:  Rate and Rhythm: Normal rate and regular rhythm.     Heart sounds: No murmur.  Pulmonary:     Effort: Pulmonary effort is normal.     Breath sounds: Normal breath sounds.  Psychiatric:        Mood and Affect: Mood normal.     Lab Results Lab Results  Component Value Date   WBC 6.2 03/31/2019   HGB 14.2 03/31/2019   HCT 41.6 03/31/2019   MCV 88.3 03/31/2019   PLT 122 (L) 03/31/2019    Lab Results  Component Value Date   CREATININE 1.59 (H) 03/31/2019   BUN 13 03/31/2019   NA 139 03/31/2019   K 4.0 03/31/2019   CL 105 03/31/2019   CO2 27 03/31/2019    Lab Results  Component Value Date   ALT 11 03/31/2019   AST 19 03/31/2019   ALKPHOS 84  04/22/2017   BILITOT 1.7 (H) 03/31/2019    Lab Results  Component Value Date   CHOL 190 03/31/2019   HDL 42 03/31/2019   LDLCALC 127 (H) 03/31/2019   TRIG 103 03/31/2019   CHOLHDL 4.5 03/31/2019   Lab Results  Component Value Date   LABRPR NON-REACTIVE 03/31/2019   HIV 1 RNA Quant (copies/mL)  Date Value  03/31/2019 <20 NOT DETECTED  09/01/2018 <20 NOT DETECTED  06/03/2018 <20 NOT DETECTED   CD4 T Cell Abs (/uL)  Date Value  03/31/2019 379 (L)  09/01/2018 350 (L)  06/03/2018 360 (L)     Problem List Items Addressed This Visit      High   Human immunodeficiency virus (HIV) disease ([place])    His adherence is perfect and as a result his infection is under excellent, long-term control.  He is having slow but steady CD4 reconstitution.  He will continue Biktarvy and follow up after blood work in 1 year.      Relevant Medications   bictegravir-emtricitabine-tenofovir AF (BIKTARVY) 50-200-25 MG TABS tablet   Other Relevant Orders   CBC   T-helper cell (CD4)- ([facility] clinic only)   Comprehensive metabolic panel   Lipid panel   RPR   HIV-1 RNA quant-no reflex-bld    Other Visit Diagnoses    HIV disease ([place])       Relevant Medications   bictegravir-emtricitabine-tenofovir AF (BIKTARVY) 50-200-25 MG TABS tablet        [name], MD [facility] for [place] [phone] pager   [phone] cell 04/21/2019, 2:38 PM

## 2019-04-21 NOTE — Assessment & Plan Note (Signed)
His adherence is perfect and as a result his infection is under excellent, long-term control.  He is having slow but steady CD4 reconstitution.  He will continue Biktarvy and follow up after blood work in 1 year.

## 2019-04-26 DIAGNOSIS — R05 Cough: Secondary | ICD-10-CM | POA: Diagnosis not present

## 2019-04-26 DIAGNOSIS — Z1159 Encounter for screening for other viral diseases: Secondary | ICD-10-CM | POA: Diagnosis not present

## 2019-04-26 DIAGNOSIS — R0602 Shortness of breath: Secondary | ICD-10-CM | POA: Diagnosis not present

## 2019-06-16 DIAGNOSIS — J310 Chronic rhinitis: Secondary | ICD-10-CM | POA: Diagnosis not present

## 2019-06-16 DIAGNOSIS — J342 Deviated nasal septum: Secondary | ICD-10-CM | POA: Diagnosis not present
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
## 2019-06-30 DIAGNOSIS — L918 Other hypertrophic disorders of the skin: Secondary | ICD-10-CM | POA: Diagnosis not present

## 2019-06-30 DIAGNOSIS — D485 Neoplasm of uncertain behavior of skin: Secondary | ICD-10-CM | POA: Diagnosis not present

## 2019-07-05 DIAGNOSIS — J343 Hypertrophy of nasal turbinates: Secondary | ICD-10-CM | POA: Diagnosis not present

## 2019-07-21 DIAGNOSIS — Z1159 Encounter for screening for other viral diseases: Secondary | ICD-10-CM | POA: Diagnosis not present

## 2019-08-10 DIAGNOSIS — Z20828 Contact with and (suspected) exposure to other viral communicable diseases: Secondary | ICD-10-CM | POA: Diagnosis not present

## 2019-08-10 DIAGNOSIS — U071 COVID-19: Secondary | ICD-10-CM | POA: Diagnosis not present

## 2019-08-10 DIAGNOSIS — R0602 Shortness of breath: Secondary | ICD-10-CM | POA: Diagnosis not present

## 2019-08-10 DIAGNOSIS — R030 Elevated blood-pressure reading, without diagnosis of hypertension: Secondary | ICD-10-CM | POA: Diagnosis not present

## 2020-03-26 ENCOUNTER — Other Ambulatory Visit: Payer: BC Managed Care – PPO

## 2020-03-26 ENCOUNTER — Other Ambulatory Visit: Payer: Self-pay

## 2020-03-26 DIAGNOSIS — B20 Human immunodeficiency virus [HIV] disease: Secondary | ICD-10-CM | POA: Diagnosis not present

## 2020-03-27 LAB — T-HELPER CELL (CD4) - (RCID CLINIC ONLY)
CD4 % Helper T Cell: 25 % — ABNORMAL LOW (ref 33–65)
CD4 T Cell Abs: 417 /uL (ref 400–1790)

## 2020-03-28 LAB — COMPREHENSIVE METABOLIC PANEL
AG Ratio: 1.3 (calc) (ref 1.0–2.5)
ALT: 18 U/L (ref 9–46)
AST: 23 U/L (ref 10–40)
Albumin: 4 g/dL (ref 3.6–5.1)
Alkaline phosphatase (APISO): 79 U/L (ref 36–130)
BUN/Creatinine Ratio: 13 (calc) (ref 6–22)
BUN: 25 mg/dL (ref 7–25)
CO2: 24 mmol/L (ref 20–32)
Calcium: 9.4 mg/dL (ref 8.6–10.3)
Chloride: 106 mmol/L (ref 98–110)
Creat: 1.89 mg/dL — ABNORMAL HIGH (ref 0.60–1.35)
Globulin: 3.1 g/dL (calc) (ref 1.9–3.7)
Glucose, Bld: 93 mg/dL (ref 65–99)
Potassium: 4.2 mmol/L (ref 3.5–5.3)
Sodium: 141 mmol/L (ref 135–146)
Total Bilirubin: 0.4 mg/dL (ref 0.2–1.2)
Total Protein: 7.1 g/dL (ref 6.1–8.1)

## 2020-03-28 LAB — CBC
HCT: 40.7 % (ref 38.5–50.0)
Hemoglobin: 13.8 g/dL (ref 13.2–17.1)
MCH: 30.4 pg (ref 27.0–33.0)
MCHC: 33.9 g/dL (ref 32.0–36.0)
MCV: 89.6 fL (ref 80.0–100.0)
MPV: 12.5 fL (ref 7.5–12.5)
Platelets: 121 10*3/uL — ABNORMAL LOW (ref 140–400)
RBC: 4.54 10*6/uL (ref 4.20–5.80)
RDW: 13.2 % (ref 11.0–15.0)
WBC: 5.5 10*3/uL (ref 3.8–10.8)

## 2020-03-28 LAB — LIPID PANEL
Cholesterol: 176 mg/dL (ref <200)
HDL: 40 mg/dL (ref >=40)
LDL Cholesterol (Calc): 102 mg/dL (calc) — ABNORMAL HIGH
Non-HDL Cholesterol (Calc): 136 mg/dL (calc) — ABNORMAL HIGH (ref <130)
Total CHOL/HDL Ratio: 4.4 (calc) (ref <5.0)
Triglycerides: 225 mg/dL — ABNORMAL HIGH (ref <150)

## 2020-03-28 LAB — RPR: RPR Ser Ql: NONREACTIVE

## 2020-03-28 LAB — HIV-1 RNA QUANT-NO REFLEX-BLD
HIV 1 RNA Quant: <20 copies/mL
HIV-1 RNA Quant, Log: <1.3 Log copies/mL

## 2020-04-05 DIAGNOSIS — J342 Deviated nasal septum: Secondary | ICD-10-CM | POA: Insufficient documentation

## 2020-04-05 DIAGNOSIS — J343 Hypertrophy of nasal turbinates: Secondary | ICD-10-CM | POA: Diagnosis not present

## 2020-04-10 ENCOUNTER — Ambulatory Visit: Payer: BC Managed Care – PPO | Admitting: Internal Medicine

## 2020-04-30 ENCOUNTER — Encounter: Payer: Self-pay | Admitting: Internal Medicine

## 2020-04-30 ENCOUNTER — Ambulatory Visit (INDEPENDENT_AMBULATORY_CARE_PROVIDER_SITE_OTHER): Payer: BC Managed Care – PPO | Admitting: Internal Medicine

## 2020-04-30 ENCOUNTER — Other Ambulatory Visit: Payer: Self-pay

## 2020-04-30 DIAGNOSIS — B20 Human immunodeficiency virus [HIV] disease: Secondary | ICD-10-CM | POA: Diagnosis not present

## 2020-04-30 NOTE — Progress Notes (Signed)
[facility] for Infectious Disease  Patient Active Problem List   Diagnosis Date Noted   Positive hepatitis C antibody test 11/18/2013    Priority: High   Human immunodeficiency virus (HIV) disease ([place]) 11/17/2013    Priority: High   Renal insufficiency 06/15/2018   Healthcare maintenance 11/04/2017   Depression 10/27/2017   Cigarette smoker 12/08/2013   Thrombocytopenia, unspecified ([place]) 11/17/2013   Epistaxis 11/17/2013   Normocytic anemia 11/17/2013   Chronic otitis media 11/17/2013   Transaminitis 11/17/2013   Nasopharyngeal mass 11/16/2013    Patient's Medications  New Prescriptions   No medications on file  Previous Medications   BICTEGRAVIR-EMTRICITABINE-TENOFOVIR AF (BIKTARVY) 50-200-25 MG TABS TABLET    Take 1 tablet by mouth daily.   FLUTICASONE (FLONASE) 50 MCG/ACT NASAL SPRAY    Place into both nostrils daily.   METHOCARBAMOL (ROBAXIN) 500 MG TABLET    Take 1 tablet (500 mg total) by mouth 2 (two) times daily.   OXYMETAZOLINE (AFRIN) 0.05 % NASAL SPRAY    Place 1 spray into both nostrils 2 (two) times daily.  Modified Medications   No medications on file  Discontinued Medications   No medications on file    Subjective: [name] is in for his routine HIV follow-up visit.  He has not had any problems obtaining, taking or tolerating his Biktarvy and does not recall missing any doses.  He has received his Covid vaccines.  He has gained weight.  He says that he lives alone and does not cook so he eats out at [employer] for every meal.  He has breakfast at [employer] each morning.  He is not getting much exercise.  Review of Systems: Review of Systems  Constitutional: Negative for fever and weight loss.    Past Medical History:  Diagnosis Date   HIV (human immunodeficiency virus infection) ([place])    ITP (idiopathic thrombocytopenic purpura) 11/24/2013    Social History   Tobacco Use   Smoking status: Former Smoker     Packs/day: 0.10    Years: 12.00    Pack years: 1.20    Types: Cigarettes    Start date: 12/30/2001    Quit date: 06/26/2014    Years since quitting: 5.8   Smokeless tobacco: Never Used   Tobacco comment: pt states he wants to quit by his birthday in July  Vaping Use   Vaping Use: Every day  Substance Use Topics   Alcohol use: No   Drug use: No    Family History  Problem Relation Age of Onset   Hypertension Neg Hx    Diabetes Neg Hx     No Known Allergies  Objective: Vitals:   04/30/20 0905  BP: (!) 138/85  Pulse: 73  Temp: 97.9 F (36.6 C)  TempSrc: Oral  Weight: (!) 236 lb (107 kg)   Body mass index is 31.14 kg/m.  Physical Exam Constitutional:      Comments: His weight is up 24 pounds.  Cardiovascular:     Rate and Rhythm: Normal rate and regular rhythm.     Heart sounds: No murmur heard.   Pulmonary:     Effort: Pulmonary effort is normal.     Breath sounds: Normal breath sounds.  Psychiatric:        Mood and Affect: Mood normal.     Lab Results    Problem List Items Addressed This Visit      High   Human immunodeficiency virus (HIV)  disease ([place])    His infection is under excellent, long-term control.  He will continue Biktarvy and follow-up after lab work in 1 year.  Encouraged him to check out YouTube videos and learn how to cook.  He will save money and hopefully eat a much healthier diet.  He also needs to get regular exercise.  We will see if we can help him get a PCP.      Relevant Orders   CBC   T-helper cell (CD4)- ([facility] clinic only)   Comprehensive metabolic panel   Lipid panel   RPR   HIV-1 RNA quant-no reflex-bld       Cameron Bickers, MD [facility] for [facility] [phone] pager   [phone] cell 04/30/2020, 9:17 AM

## 2020-04-30 NOTE — Assessment & Plan Note (Signed)
His infection is under excellent, long-term control.  He will continue Biktarvy and follow-up after lab work in 1 year.  Encouraged him to check out YouTube videos and learn how to cook.  He will save money and hopefully eat a much healthier diet.  He also needs to get regular exercise.  We will see if we can help him get a PCP.

## 2020-05-05 ENCOUNTER — Other Ambulatory Visit: Payer: Self-pay | Admitting: Internal Medicine

## 2020-05-05 DIAGNOSIS — B20 Human immunodeficiency virus [HIV] disease: Secondary | ICD-10-CM

## 2020-07-27 DIAGNOSIS — Z20822 Contact with and (suspected) exposure to covid-19: Secondary | ICD-10-CM | POA: Diagnosis not present

## 2020-12-03 DIAGNOSIS — Z20822 Contact with and (suspected) exposure to covid-19: Secondary | ICD-10-CM | POA: Diagnosis not present

## 2021-05-17 ENCOUNTER — Other Ambulatory Visit: Payer: Self-pay | Admitting: Internal Medicine

## 2021-05-17 DIAGNOSIS — B20 Human immunodeficiency virus [HIV] disease: Secondary | ICD-10-CM

## 2021-05-20 ENCOUNTER — Other Ambulatory Visit: Payer: Self-pay

## 2021-05-20 DIAGNOSIS — B20 Human immunodeficiency virus [HIV] disease: Secondary | ICD-10-CM

## 2021-05-20 DIAGNOSIS — Z113 Encounter for screening for infections with a predominantly sexual mode of transmission: Secondary | ICD-10-CM

## 2021-05-20 DIAGNOSIS — Z79899 Other long term (current) drug therapy: Secondary | ICD-10-CM

## 2021-05-23 ENCOUNTER — Other Ambulatory Visit: Payer: BC Managed Care – PPO

## 2021-05-30 ENCOUNTER — Other Ambulatory Visit: Payer: BC Managed Care – PPO

## 2021-06-03 DIAGNOSIS — Z20822 Contact with and (suspected) exposure to covid-19: Secondary | ICD-10-CM | POA: Diagnosis not present

## 2021-06-06 ENCOUNTER — Encounter: Payer: BC Managed Care – PPO | Admitting: Internal Medicine

## 2021-06-18 ENCOUNTER — Other Ambulatory Visit: Payer: Self-pay | Admitting: Internal Medicine

## 2021-06-18 DIAGNOSIS — B20 Human immunodeficiency virus [HIV] disease: Secondary | ICD-10-CM

## 2021-06-24 ENCOUNTER — Other Ambulatory Visit: Payer: Self-pay | Admitting: Internal Medicine

## 2021-06-24 DIAGNOSIS — B20 Human immunodeficiency virus [HIV] disease: Secondary | ICD-10-CM

## 2021-06-25 NOTE — Telephone Encounter (Signed)
Patient has appt on 09/22

## 2021-06-27 ENCOUNTER — Encounter: Payer: Self-pay | Admitting: Internal Medicine

## 2021-06-27 ENCOUNTER — Other Ambulatory Visit: Payer: Self-pay

## 2021-06-27 ENCOUNTER — Ambulatory Visit (INDEPENDENT_AMBULATORY_CARE_PROVIDER_SITE_OTHER): Payer: BC Managed Care – PPO | Admitting: Internal Medicine

## 2021-06-27 ENCOUNTER — Ambulatory Visit: Payer: BC Managed Care – PPO

## 2021-06-27 VITALS — BP 153/102 | HR 68 | Temp 98.9°F | Ht 74.0 in | Wt 206.8 lb

## 2021-06-27 DIAGNOSIS — B20 Human immunodeficiency virus [HIV] disease: Secondary | ICD-10-CM

## 2021-06-27 DIAGNOSIS — F329 Major depressive disorder, single episode, unspecified: Secondary | ICD-10-CM

## 2021-06-27 DIAGNOSIS — F17210 Nicotine dependence, cigarettes, uncomplicated: Secondary | ICD-10-CM

## 2021-06-27 DIAGNOSIS — N289 Disorder of kidney and ureter, unspecified: Secondary | ICD-10-CM

## 2021-06-27 DIAGNOSIS — Z23 Encounter for immunization: Secondary | ICD-10-CM

## 2021-06-27 DIAGNOSIS — Z79899 Other long term (current) drug therapy: Secondary | ICD-10-CM | POA: Diagnosis not present

## 2021-06-27 MED ORDER — BIKTARVY 50-200-25 MG PO TABS: 1.0000 | ORAL_TABLET | Freq: Every day | ORAL | 11 refills | Status: DC

## 2021-06-27 NOTE — Progress Notes (Signed)
Patient Active Problem List   Diagnosis Date Noted   Positive hepatitis C antibody test 11/18/2013    Priority: High   Human immunodeficiency virus (HIV) disease ([place]) 11/17/2013    Priority: High   Renal insufficiency 06/15/2018   Healthcare maintenance 11/04/2017   Depression 10/27/2017   Cigarette smoker 12/08/2013   Thrombocytopenia, unspecified ([place]) 11/17/2013   Epistaxis 11/17/2013   Normocytic anemia 11/17/2013   Chronic otitis media 11/17/2013   Transaminitis 11/17/2013   Nasopharyngeal mass 11/16/2013    Patient's Medications  New Prescriptions   No medications on file  Previous Medications   FLUTICASONE (FLONASE) 50 MCG/ACT NASAL SPRAY    Place into both nostrils daily.   METHOCARBAMOL (ROBAXIN) 500 MG TABLET    Take 1 tablet (500 mg total) by mouth 2 (two) times daily.   OXYMETAZOLINE (AFRIN) 0.05 % NASAL SPRAY    Place 1 spray into both nostrils 2 (two) times daily.  Modified Medications   Modified Medication Previous Medication   BICTEGRAVIR-EMTRICITABINE-TENOFOVIR AF (BIKTARVY) 50-200-25 MG TABS TABLET BIKTARVY 50-200-25 MG TABS tablet      Take 1 tablet by mouth daily.    TAKE 1 TABLET BY MOUTH DAILY  Discontinued Medications   No medications on file    Subjective: [name] is in for his routine HIV follow-up visit.  He ran out of his Biktarvy 7 days ago because he needed new refills.  Fortunately, he says that he was never missing doses when he was able to get his Biktarvy.  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  He enjoys his work.  He has been trying to lose weight and has been successful over the last year by changing his diet.  He had his first 2 COVID vaccines last year but has not had a booster.  He denies feeling anxious or depressed.  He says that he has a good level of support from his friends and family.  He is still smoking about a pack of cigarettes each week.  He is no longer bothered by nasal  congestion.  Review of Systems: Review of Systems  Constitutional:  Negative for chills, diaphoresis, fever, malaise/fatigue and weight loss.  HENT:  Negative for sore throat.   Respiratory:  Negative for cough, sputum production and shortness of breath.   Cardiovascular:  Negative for chest pain.  Gastrointestinal:  Negative for abdominal pain, diarrhea, heartburn, nausea and vomiting.  Genitourinary:  Negative for dysuria and frequency.  Musculoskeletal:  Negative for joint pain and myalgias.  Skin:  Negative for rash.  Neurological:  Negative for dizziness and headaches.  Psychiatric/Behavioral:  Negative for depression and substance abuse. The patient is not nervous/anxious.    Past Medical History:  Diagnosis Date   HIV (human immunodeficiency virus infection) ([place])    ITP (idiopathic thrombocytopenic purpura) 11/24/2013    Social History   Tobacco Use   Smoking status: Former    Packs/day: 0.10    Years: 12.00    Pack years: 1.20    Types: Cigarettes    Start date: 12/30/2001    Quit date: 06/26/2014    Years since quitting: 7.0   Smokeless tobacco: Never   Tobacco comments:    pt states he wants to quit by his birthday in July  Vaping Use   Vaping Use: Every day  Substance Use Topics   Alcohol use: No   Drug use: No    Family History  Problem Relation Age of Onset   Hypertension Neg Hx    Diabetes Neg Hx     No Known Allergies  Health Maintenance  Topic Date Due   COVID-19 Vaccine (1) Never done   INFLUENZA VACCINE  05/06/2021   TETANUS/TDAP  12/19/2028   Hepatitis C Screening  Completed   HIV Screening  Completed   HPV VACCINES  Aged Out    Objective:  Vitals:   06/27/21 0959  BP: (!) 153/102  Pulse: 68  Temp: 98.9 F (37.2 C)  TempSrc: Oral  SpO2: 98%  Weight: 206 lb 12.8 oz (93.8 kg)  Height: 6\' 2"  (1.88 m)   Body mass index is 26.55 kg/m.  Physical Exam Constitutional:      Comments: His weight is down almost 60 pounds.  He is  quiet and pleasant as usual.  Cardiovascular:     Rate and Rhythm: Normal rate and regular rhythm.     Heart sounds: No murmur heard. Pulmonary:     Effort: Pulmonary effort is normal.     Breath sounds: Normal breath sounds.  Abdominal:     Palpations: Abdomen is soft.     Tenderness: There is no abdominal tenderness.  Skin:    Findings: No rash.  Neurological:     General: No focal deficit present.  Psychiatric:        Mood and Affect: Mood normal.    Lab Results Lab Results  Component Value Date   WBC 5.5 03/26/2020   HGB 13.8 03/26/2020   HCT 40.7 03/26/2020   MCV 89.6 03/26/2020   PLT 121 (L) 03/26/2020    Lab Results  Component Value Date   CREATININE 1.89 (H) 03/26/2020   BUN 25 03/26/2020   NA 141 03/26/2020   K 4.2 03/26/2020   CL 106 03/26/2020   CO2 24 03/26/2020    Lab Results  Component Value Date   ALT 18 03/26/2020   AST 23 03/26/2020   ALKPHOS 84 04/22/2017   BILITOT 0.4 03/26/2020    Lab Results  Component Value Date   CHOL 176 03/26/2020   HDL 40 03/26/2020   LDLCALC 102 (H) 03/26/2020   TRIG 225 (H) 03/26/2020   CHOLHDL 4.4 03/26/2020   Lab Results  Component Value Date   LABRPR NON-REACTIVE 03/26/2020   HIV 1 RNA Quant (copies/mL)  Date Value  03/26/2020 <20 NOT DETECTED  03/31/2019 <20 NOT DETECTED  09/01/2018 <20 NOT DETECTED   CD4 T Cell Abs (/uL)  Date Value  03/26/2020 417  03/31/2019 379 (L)  09/01/2018 350 (L)     Problem List Items Addressed This Visit       High   Human immunodeficiency virus (HIV) disease ([place])    His infection has been under excellent, long-term control since his diagnosis in 2015.  I reminded him to let us know right away if he ever has any difficulty obtaining his Biktarvy.  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  He did get his COVID booster and annual influenza vaccine today.      Relevant Medications   bictegravir-emtricitabine-tenofovir AF  (BIKTARVY) 50-200-25 MG TABS tablet   Other Relevant Orders   T-helper cell (CD4)- ([facility] clinic only)   HIV-1 RNA quant-no reflex-bld   CBC   Comprehensive metabolic panel   RPR   Lipid panel     Unprioritized   Cigarette smoker    I talked to him about the importance of quitting  completely and asked him to work on cutting down and setting a quit date.      Depression    His depression is in remission.      Renal insufficiency    I will repeat his creatinine today.      Other Visit Diagnoses     HIV disease ([place])       Relevant Medications   bictegravir-emtricitabine-tenofovir AF (BIKTARVY) 50-200-25 MG TABS tablet         [name], MD [facility] for Infectious [place] [phone] pager   [phone] cell 06/27/2021, 10:21 AM

## 2021-06-27 NOTE — Assessment & Plan Note (Signed)
I talked to him about the importance of quitting completely and asked him to work on cutting down and setting a quit date.

## 2021-06-27 NOTE — Assessment & Plan Note (Signed)
His depression is in remission. 

## 2021-06-27 NOTE — Assessment & Plan Note (Signed)
His infection has been under excellent, long-term control since his diagnosis in 2015.  I reminded him to let us know right away if he ever has any difficulty obtaining his Biktarvy.  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  He did get his COVID booster and annual influenza vaccine today.

## 2021-06-27 NOTE — Assessment & Plan Note (Signed)
I will repeat his creatinine today.

## 2021-06-28 LAB — T-HELPER CELL (CD4) - (RCID CLINIC ONLY)
CD4 % Helper T Cell: 19 % — ABNORMAL LOW (ref 33–65)
CD4 T Cell Abs: 383 /uL — ABNORMAL LOW (ref 400–1790)

## 2021-07-01 LAB — COMPREHENSIVE METABOLIC PANEL
AG Ratio: 1.1 (calc) (ref 1.0–2.5)
ALT: 8 U/L — ABNORMAL LOW (ref 9–46)
AST: 16 U/L (ref 10–40)
Albumin: 3.8 g/dL (ref 3.6–5.1)
Alkaline phosphatase (APISO): 82 U/L (ref 36–130)
BUN/Creatinine Ratio: 8 (calc) (ref 6–22)
BUN: 13 mg/dL (ref 7–25)
CO2: 29 mmol/L (ref 20–32)
Calcium: 9.2 mg/dL (ref 8.6–10.3)
Chloride: 104 mmol/L (ref 98–110)
Creat: 1.54 mg/dL — ABNORMAL HIGH (ref 0.60–1.26)
Globulin: 3.6 g/dL (calc) (ref 1.9–3.7)
Glucose, Bld: 94 mg/dL (ref 65–99)
Potassium: 4.1 mmol/L (ref 3.5–5.3)
Sodium: 139 mmol/L (ref 135–146)
Total Bilirubin: 1.2 mg/dL (ref 0.2–1.2)
Total Protein: 7.4 g/dL (ref 6.1–8.1)

## 2021-07-01 LAB — CBC
HCT: 43.6 % (ref 38.5–50.0)
Hemoglobin: 14.4 g/dL (ref 13.2–17.1)
MCH: 29.3 pg (ref 27.0–33.0)
MCHC: 33 g/dL (ref 32.0–36.0)
MCV: 88.6 fL (ref 80.0–100.0)
MPV: 11.6 fL (ref 7.5–12.5)
Platelets: 181 10*3/uL (ref 140–400)
RBC: 4.92 10*6/uL (ref 4.20–5.80)
RDW: 13.1 % (ref 11.0–15.0)
WBC: 7.5 10*3/uL (ref 3.8–10.8)

## 2021-07-01 LAB — HIV-1 RNA QUANT-NO REFLEX-BLD
HIV 1 RNA Quant: NOT DETECTED Copies/mL
HIV-1 RNA Quant, Log: NOT DETECTED Log cps/mL

## 2021-07-01 LAB — LIPID PANEL
Cholesterol: 179 mg/dL (ref <200)
HDL: 41 mg/dL (ref >=40)
LDL Cholesterol (Calc): 116 mg/dL (calc) — ABNORMAL HIGH
Non-HDL Cholesterol (Calc): 138 mg/dL (calc) — ABNORMAL HIGH (ref <130)
Total CHOL/HDL Ratio: 4.4 (calc) (ref <5.0)
Triglycerides: 116 mg/dL (ref <150)

## 2021-07-01 LAB — FLUORESCENT TREPONEMAL AB(FTA)-IGG-BLD: Fluorescent Treponemal ABS: REACTIVE — AB

## 2021-07-01 LAB — RPR TITER: RPR Titer: 1:2 {titer} — ABNORMAL HIGH

## 2021-07-01 LAB — RPR: RPR Ser Ql: REACTIVE — AB

## 2022-08-16 ENCOUNTER — Other Ambulatory Visit: Payer: Self-pay | Admitting: Internal Medicine

## 2022-08-16 DIAGNOSIS — B20 Human immunodeficiency virus [HIV] disease: Secondary | ICD-10-CM

## 2022-08-18 NOTE — Telephone Encounter (Signed)
Appt 08/20/22

## 2022-08-20 ENCOUNTER — Telehealth: Payer: Self-pay

## 2022-08-20 ENCOUNTER — Ambulatory Visit: Payer: BC Managed Care – PPO | Admitting: Internal Medicine

## 2022-08-20 NOTE — Telephone Encounter (Signed)
Patient no showed with Dr Megan Salon 08/10/22- attempted to contact to reschedule and vm was full.

## 2022-09-23 ENCOUNTER — Other Ambulatory Visit: Payer: Self-pay | Admitting: Internal Medicine

## 2022-09-23 DIAGNOSIS — B20 Human immunodeficiency virus [HIV] disease: Secondary | ICD-10-CM

## 2022-10-22 ENCOUNTER — Telehealth: Payer: Self-pay | Admitting: Internal Medicine

## 2022-10-28 ENCOUNTER — Other Ambulatory Visit: Payer: Self-pay | Admitting: Internal Medicine

## 2022-10-28 DIAGNOSIS — B20 Human immunodeficiency virus [HIV] disease: Secondary | ICD-10-CM

## 2022-10-28 NOTE — Telephone Encounter (Signed)
Overdue for appt

## 2022-11-02 ENCOUNTER — Other Ambulatory Visit: Payer: Self-pay | Admitting: Internal Medicine

## 2022-11-02 DIAGNOSIS — B20 Human immunodeficiency virus [HIV] disease: Secondary | ICD-10-CM

## 2022-11-03 ENCOUNTER — Other Ambulatory Visit: Payer: Self-pay

## 2022-11-03 ENCOUNTER — Telehealth: Payer: Self-pay

## 2022-11-03 DIAGNOSIS — B20 Human immunodeficiency virus [HIV] disease: Secondary | ICD-10-CM

## 2022-11-03 MED ORDER — BIKTARVY 50-200-25 MG PO TABS: 1.0000 | ORAL_TABLET | Freq: Every day | ORAL | 0 refills | Status: DC

## 2022-11-03 NOTE — Telephone Encounter (Signed)
Called patient after receiving refill request for Biktarvy. Patient last seen provider in September 2022. Is overdue for follow up and lab work.  Will need to have this done for additional refills.  Refill denied at this time. Leatrice Jewels, RMA

## 2022-11-11 DIAGNOSIS — A530 Latent syphilis, unspecified as early or late: Secondary | ICD-10-CM | POA: Insufficient documentation

## 2022-11-12 ENCOUNTER — Ambulatory Visit: Payer: BC Managed Care – PPO | Admitting: Internal Medicine

## 2022-11-12 DIAGNOSIS — A530 Latent syphilis, unspecified as early or late: Secondary | ICD-10-CM

## 2022-11-19 ENCOUNTER — Ambulatory Visit: Payer: BC Managed Care – PPO | Admitting: Internal Medicine

## 2022-11-26 ENCOUNTER — Ambulatory Visit: Payer: BC Managed Care – PPO | Admitting: Internal Medicine

## 2022-12-03 ENCOUNTER — Encounter: Payer: Self-pay | Admitting: Internal Medicine

## 2022-12-03 ENCOUNTER — Other Ambulatory Visit: Payer: Self-pay

## 2022-12-03 ENCOUNTER — Ambulatory Visit (INDEPENDENT_AMBULATORY_CARE_PROVIDER_SITE_OTHER): Payer: BC Managed Care – PPO | Admitting: Internal Medicine

## 2022-12-03 VITALS — BP 153/106 | HR 78 | Temp 98.6°F | Resp 16 | Ht 73.0 in | Wt 217.4 lb

## 2022-12-03 DIAGNOSIS — A530 Latent syphilis, unspecified as early or late: Secondary | ICD-10-CM

## 2022-12-03 DIAGNOSIS — Z87891 Personal history of nicotine dependence: Secondary | ICD-10-CM

## 2022-12-03 DIAGNOSIS — B20 Human immunodeficiency virus [HIV] disease: Secondary | ICD-10-CM

## 2022-12-03 DIAGNOSIS — Z79899 Other long term (current) drug therapy: Secondary | ICD-10-CM | POA: Diagnosis not present

## 2022-12-03 DIAGNOSIS — N289 Disorder of kidney and ureter, unspecified: Secondary | ICD-10-CM

## 2022-12-03 DIAGNOSIS — F329 Major depressive disorder, single episode, unspecified: Secondary | ICD-10-CM | POA: Diagnosis not present

## 2022-12-03 MED ORDER — PENICILLIN G BENZATHINE 1200000 UNIT/2ML IM SUSY
2.4000 10*6.[IU] | PREFILLED_SYRINGE | Freq: Once | INTRAMUSCULAR | Status: AC
  Administered 2022-12-03: 2.4 10*6.[IU] via INTRAMUSCULAR

## 2022-12-03 MED ORDER — BIKTARVY 50-200-25 MG PO TABS: 1.0000 | ORAL_TABLET | Freq: Every day | ORAL | 11 refills | Status: DC

## 2022-12-03 NOTE — Addendum Note (Signed)
Addended by: Tomi Bamberger on: 12/03/2022 11:36 AM   Modules accepted: Orders

## 2022-12-03 NOTE — Progress Notes (Signed)
Patient Active Problem List   Diagnosis Date Noted   Positive hepatitis C antibody test 11/18/2013    Priority: High   Human immunodeficiency virus (HIV) disease ([place]) 11/17/2013    Priority: High   Latent syphilis 11/11/2022   Renal insufficiency 06/15/2018   Healthcare maintenance 11/04/2017   Depression 10/27/2017   Cigarette smoker 12/08/2013   Thrombocytopenia, unspecified ([place]) 11/17/2013   Epistaxis 11/17/2013   Normocytic anemia 11/17/2013   Chronic otitis media 11/17/2013   Transaminitis 11/17/2013   Nasopharyngeal mass 11/16/2013    Patient's Medications  New Prescriptions   No medications on file  Previous Medications   BICTEGRAVIR-EMTRICITABINE-TENOFOVIR AF (BIKTARVY) 50-200-25 MG TABS TABLET    Take 1 tablet by mouth daily. PLEASE CALL [phone] TO SCHEDULE AN APPOINTMENT   FLUTICASONE (FLONASE) 50 MCG/ACT NASAL SPRAY    Place into both nostrils daily.   METHOCARBAMOL (ROBAXIN) 500 MG TABLET    Take 1 tablet (500 mg total) by mouth 2 (two) times daily.   OXYMETAZOLINE (AFRIN) 0.05 % NASAL SPRAY    Place 1 spray into both nostrils 2 (two) times daily.  Modified Medications   No medications on file  Discontinued Medications   No medications on file    Subjective: [name] is in for his first visit since September 2022.  He ran out of his Biktarvy 5 days ago.  Prior to that he does not believe he was missing any doses.  He quit smoking cigarettes last year.  He denies feeling anxious or depressed.  He does not have a PCP.  At his last visit in 2022 his RPR had turned positive.  We were unable to get in touch with him and he was never contacted by the health department.  He had no known history of syphilis prior to that.  His confirmatory FTA was positive.  Review of Systems: Review of Systems  Constitutional:  Negative for fever.  Genitourinary:  Negative for dysuria.  Skin:  Negative for rash.  Psychiatric/Behavioral:  Negative for depression. The  patient is not nervous/anxious.     Past Medical History:  Diagnosis Date   HIV (human immunodeficiency virus infection) ([place])    ITP (idiopathic thrombocytopenic purpura) 11/24/2013    Social History   Tobacco Use   Smoking status: Former    Packs/day: 0.10    Years: 12.00    Total pack years: 1.20    Types: Cigarettes    Start date: 12/30/2001    Quit date: 06/26/2014    Years since quitting: 8.4   Smokeless tobacco: Never   Tobacco comments:    pt states he wants to quit by his birthday in July  Vaping Use   Vaping Use: Every day  Substance Use Topics   Alcohol use: No   Drug use: No    Family History  Problem Relation Age of Onset   Hypertension Neg Hx    Diabetes Neg Hx     No Known Allergies  Health Maintenance  Topic Date Due   COVID-19 Vaccine (2 - Pfizer risk series) 07/18/2021   INFLUENZA VACCINE  05/06/2022   DTaP/Tdap/Td (3 - Td or Tdap) 12/19/2028   Hepatitis C Screening  Completed   HIV Screening  Completed   HPV VACCINES  Aged Out    Objective:  Vitals:   12/03/22 1052  BP: (!) 170/111  Pulse: 78  Resp: 16  Temp: 98.6 F (37 C)  TempSrc: Oral  SpO2: 97%  Weight: 217 lb 6.4 oz (98.6 kg)  Height: '6\' 1"'$  (1.854 m)   Body mass index is 28.68 kg/m.  Physical Exam Constitutional:      Comments: He is in good spirits.  Cardiovascular:     Rate and Rhythm: Normal rate and regular rhythm.     Heart sounds: No murmur heard. Pulmonary:     Effort: Pulmonary effort is normal.     Breath sounds: Normal breath sounds.  Psychiatric:        Mood and Affect: Mood normal.     Lab Results Lab Results  Component Value Date   WBC 7.5 06/27/2021   HGB 14.4 06/27/2021   HCT 43.6 06/27/2021   MCV 88.6 06/27/2021   PLT 181 06/27/2021    Lab Results  Component Value Date   CREATININE 1.54 (H) 06/27/2021   BUN 13 06/27/2021   NA 139 06/27/2021   K 4.1 06/27/2021   CL 104 06/27/2021   CO2 29 06/27/2021    Lab Results  Component Value  Date   ALT 8 (L) 06/27/2021   AST 16 06/27/2021   ALKPHOS 84 04/22/2017   BILITOT 1.2 06/27/2021    Lab Results  Component Value Date   CHOL 179 06/27/2021   HDL 41 06/27/2021   LDLCALC 116 (H) 06/27/2021   TRIG 116 06/27/2021   CHOLHDL 4.4 06/27/2021   Lab Results  Component Value Date   LABRPR REACTIVE (A) 06/27/2021   RPRTITER 1:2 (H) 06/27/2021   HIV 1 RNA Quant  Date Value  06/27/2021 Not Detected Copies/mL  03/26/2020 <20 NOT DETECTED copies/mL  03/31/2019 <20 NOT DETECTED copies/mL   CD4 T Cell Abs (/uL)  Date Value  06/27/2021 383 (L)  03/26/2020 417  03/31/2019 379 (L)     Problem List Items Addressed This Visit       High   Human immunodeficiency virus (HIV) disease ([place]) - Primary   Other Visit Diagnoses     HIV disease ([place])             [name], [facility] [place] [facility] [phone] pager   [phone] cell 12/03/2022, 11:08 AM

## 2022-12-03 NOTE — Assessment & Plan Note (Signed)
His infection has been under excellent, long-term control.  He will get updated blood work today and I will refill Biktarvy.  I encouraged him to keep regular, annual visits and to let us know if he ever has difficulty getting his Biktarvy.  He will follow-up in 1 year.

## 2022-12-03 NOTE — Assessment & Plan Note (Signed)
I congratulated him on quitting.

## 2022-12-03 NOTE — Assessment & Plan Note (Signed)
We will recheck his blood pressure before discharge today and help him get a PCP.  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  Biktarvy is not nephrotoxic.

## 2022-12-03 NOTE — Assessment & Plan Note (Signed)
His depression is in remission. 

## 2022-12-03 NOTE — Assessment & Plan Note (Signed)
I will begin treatment for late latent syphilis and repeat his RPR today.

## 2022-12-04 LAB — T-HELPER CELLS (CD4) COUNT (NOT AT ARMC)
CD4 % Helper T Cell: 29 % — ABNORMAL LOW (ref 33–65)
CD4 T Cell Abs: 509 /uL (ref 400–1790)

## 2022-12-05 LAB — LIPID PANEL
Cholesterol: 185 mg/dL (ref <200)
HDL: 45 mg/dL (ref >=40)
LDL Cholesterol (Calc): 119 mg/dL (calc) — ABNORMAL HIGH
Non-HDL Cholesterol (Calc): 140 mg/dL (calc) — ABNORMAL HIGH (ref <130)
Total CHOL/HDL Ratio: 4.1 (calc) (ref <5.0)
Triglycerides: 105 mg/dL (ref <150)

## 2022-12-05 LAB — COMPREHENSIVE METABOLIC PANEL
AG Ratio: 1.2 (calc) (ref 1.0–2.5)
ALT: 10 U/L (ref 9–46)
AST: 19 U/L (ref 10–40)
Albumin: 3.7 g/dL (ref 3.6–5.1)
Alkaline phosphatase (APISO): 103 U/L (ref 36–130)
BUN/Creatinine Ratio: 9 (calc) (ref 6–22)
BUN: 16 mg/dL (ref 7–25)
CO2: 28 mmol/L (ref 20–32)
Calcium: 9.2 mg/dL (ref 8.6–10.3)
Chloride: 108 mmol/L (ref 98–110)
Creat: 1.83 mg/dL — ABNORMAL HIGH (ref 0.60–1.26)
Globulin: 3.1 g/dL (calc) (ref 1.9–3.7)
Glucose, Bld: 84 mg/dL (ref 65–99)
Potassium: 4.1 mmol/L (ref 3.5–5.3)
Sodium: 140 mmol/L (ref 135–146)
Total Bilirubin: 0.7 mg/dL (ref 0.2–1.2)
Total Protein: 6.8 g/dL (ref 6.1–8.1)

## 2022-12-05 LAB — CBC
HCT: 41.9 % (ref 38.5–50.0)
Hemoglobin: 14.1 g/dL (ref 13.2–17.1)
MCH: 29.6 pg (ref 27.0–33.0)
MCHC: 33.7 g/dL (ref 32.0–36.0)
MCV: 88 fL (ref 80.0–100.0)
MPV: 11.9 fL (ref 7.5–12.5)
Platelets: 135 10*3/uL — ABNORMAL LOW (ref 140–400)
RBC: 4.76 10*6/uL (ref 4.20–5.80)
RDW: 13 % (ref 11.0–15.0)
WBC: 5.4 10*3/uL (ref 3.8–10.8)

## 2022-12-05 LAB — HIV-1 RNA QUANT-NO REFLEX-BLD
HIV 1 RNA Quant: NOT DETECTED Copies/mL
HIV-1 RNA Quant, Log: NOT DETECTED Log cps/mL

## 2022-12-05 LAB — RPR TITER: RPR Titer: 1:2 {titer} — ABNORMAL HIGH

## 2022-12-05 LAB — RPR: RPR Ser Ql: REACTIVE — AB

## 2022-12-05 LAB — T PALLIDUM AB: T Pallidum Abs: POSITIVE — AB

## 2022-12-10 ENCOUNTER — Other Ambulatory Visit: Payer: Self-pay

## 2022-12-10 ENCOUNTER — Ambulatory Visit (INDEPENDENT_AMBULATORY_CARE_PROVIDER_SITE_OTHER): Payer: BC Managed Care – PPO

## 2022-12-10 DIAGNOSIS — A530 Latent syphilis, unspecified as early or late: Secondary | ICD-10-CM | POA: Diagnosis not present

## 2022-12-10 MED ORDER — PENICILLIN G BENZATHINE 1200000 UNIT/2ML IM SUSY
2.4000 10*6.[IU] | PREFILLED_SYRINGE | Freq: Once | INTRAMUSCULAR | Status: AC
  Administered 2022-12-10: 2.4 10*6.[IU] via INTRAMUSCULAR

## 2022-12-17 ENCOUNTER — Ambulatory Visit (INDEPENDENT_AMBULATORY_CARE_PROVIDER_SITE_OTHER): Payer: BC Managed Care – PPO

## 2022-12-17 ENCOUNTER — Other Ambulatory Visit: Payer: Self-pay

## 2022-12-17 DIAGNOSIS — A530 Latent syphilis, unspecified as early or late: Secondary | ICD-10-CM | POA: Diagnosis not present

## 2022-12-17 MED ORDER — PENICILLIN G BENZATHINE 1200000 UNIT/2ML IM SUSY
1.2000 10*6.[IU] | PREFILLED_SYRINGE | Freq: Once | INTRAMUSCULAR | Status: AC
  Administered 2022-12-17: 1.2 10*6.[IU] via INTRAMUSCULAR

## 2022-12-17 NOTE — Progress Notes (Signed)
Reviewed and verified allergies with patient. Patient tolerated Bicillin injections well. Reinforced abstinence until treatment completed plus an additional 7 days, offered condoms and encouraged use. Patient verbalized understanding.   Beryle Flock, RN

## 2023-04-02 ENCOUNTER — Emergency Department (HOSPITAL_COMMUNITY)
Admission: EM | Admit: 2023-04-02 | Discharge: 2023-04-02 | Disposition: A | Discharge status: Home / Self Care | Payer: BC Managed Care – PPO | Attending: Emergency Medicine | Admitting: Emergency Medicine

## 2023-04-02 ENCOUNTER — Other Ambulatory Visit: Payer: Self-pay

## 2023-04-02 ENCOUNTER — Emergency Department (HOSPITAL_COMMUNITY): Payer: BC Managed Care – PPO

## 2023-04-02 DIAGNOSIS — K6289 Other specified diseases of anus and rectum: Secondary | ICD-10-CM | POA: Diagnosis not present

## 2023-04-02 DIAGNOSIS — N179 Acute kidney failure, unspecified: Secondary | ICD-10-CM | POA: Insufficient documentation

## 2023-04-02 DIAGNOSIS — Z21 Asymptomatic human immunodeficiency virus [HIV] infection status: Secondary | ICD-10-CM | POA: Insufficient documentation

## 2023-04-02 DIAGNOSIS — N189 Chronic kidney disease, unspecified: Secondary | ICD-10-CM | POA: Diagnosis not present

## 2023-04-02 DIAGNOSIS — R000 Tachycardia, unspecified: Secondary | ICD-10-CM | POA: Insufficient documentation

## 2023-04-02 DIAGNOSIS — Z79899 Other long term (current) drug therapy: Secondary | ICD-10-CM | POA: Diagnosis not present

## 2023-04-02 DIAGNOSIS — R599 Enlarged lymph nodes, unspecified: Secondary | ICD-10-CM | POA: Diagnosis not present

## 2023-04-02 LAB — BASIC METABOLIC PANEL
Anion gap: 7 (ref 5–15)
BUN: 14 mg/dL (ref 6–20)
CO2: 25 mmol/L (ref 22–32)
Calcium: 8.9 mg/dL (ref 8.9–10.3)
Chloride: 104 mmol/L (ref 98–111)
Creatinine, Ser: 2.62 mg/dL — ABNORMAL HIGH (ref 0.61–1.24)
GFR, Estimated: 31 mL/min — ABNORMAL LOW (ref >60)
Glucose, Bld: 102 mg/dL — ABNORMAL HIGH (ref 70–99)
Potassium: 4 mmol/L (ref 3.5–5.1)
Sodium: 136 mmol/L (ref 135–145)

## 2023-04-02 LAB — URINALYSIS, W/ REFLEX TO CULTURE (INFECTION SUSPECTED)
Bacteria, UA: NONE SEEN
Bilirubin Urine: NEGATIVE
Glucose, UA: NEGATIVE mg/dL
Hgb urine dipstick: NEGATIVE
Ketones, ur: NEGATIVE mg/dL
Leukocytes,Ua: NEGATIVE
Nitrite: NEGATIVE
Protein, ur: >=300 mg/dL — AB
Specific Gravity, Urine: 1.032 — ABNORMAL HIGH (ref 1.005–1.030)
pH: 6 (ref 5.0–8.0)

## 2023-04-02 LAB — I-STAT CHEM 8, ED
BUN: 19 mg/dL (ref 6–20)
Calcium, Ion: 0.87 mmol/L — CL (ref 1.15–1.40)
Chloride: 110 mmol/L (ref 98–111)
Creatinine, Ser: 2.9 mg/dL — ABNORMAL HIGH (ref 0.61–1.24)
Glucose, Bld: 102 mg/dL — ABNORMAL HIGH (ref 70–99)
HCT: 43 % (ref 39.0–52.0)
Hemoglobin: 14.6 g/dL (ref 13.0–17.0)
Potassium: 4.3 mmol/L (ref 3.5–5.1)
Sodium: 137 mmol/L (ref 135–145)
TCO2: 22 mmol/L (ref 22–32)

## 2023-04-02 LAB — CBC WITH DIFFERENTIAL/PLATELET
Abs Immature Granulocytes: 0.01 10*3/uL (ref 0.00–0.07)
Basophils Absolute: 0 10*3/uL (ref 0.0–0.1)
Basophils Relative: 0 %
Eosinophils Absolute: 0 10*3/uL (ref 0.0–0.5)
Eosinophils Relative: 0 %
HCT: 44.4 % (ref 39.0–52.0)
Hemoglobin: 14.6 g/dL (ref 13.0–17.0)
Immature Granulocytes: 0 %
Lymphocytes Relative: 28 %
Lymphs Abs: 1.7 10*3/uL (ref 0.7–4.0)
MCH: 29.2 pg (ref 26.0–34.0)
MCHC: 32.9 g/dL (ref 30.0–36.0)
MCV: 88.8 fL (ref 80.0–100.0)
Monocytes Absolute: 0.7 10*3/uL (ref 0.1–1.0)
Monocytes Relative: 11 %
Neutro Abs: 3.6 10*3/uL (ref 1.7–7.7)
Neutrophils Relative %: 61 %
Platelets: 142 10*3/uL — ABNORMAL LOW (ref 150–400)
RBC: 5 MIL/uL (ref 4.22–5.81)
RDW: 12.6 % (ref 11.5–15.5)
WBC: 6.1 10*3/uL (ref 4.0–10.5)
nRBC: 0 % (ref 0.0–0.2)

## 2023-04-02 MED ORDER — OXYCODONE-ACETAMINOPHEN 5-325 MG PO TABS
1.0000 | ORAL_TABLET | Freq: Once | ORAL | Status: AC
  Administered 2023-04-02: 1 via ORAL
  Filled 2023-04-02: qty 1

## 2023-04-02 MED ORDER — IOHEXOL 350 MG/ML SOLN
60.0000 mL | Freq: Once | INTRAVENOUS | Status: AC | PRN
  Administered 2023-04-02: 60 mL via INTRAVENOUS

## 2023-04-02 MED ORDER — DOXYCYCLINE HYCLATE 100 MG PO CAPS: 100.0000 mg | ORAL_CAPSULE | Freq: Two times a day (BID) | ORAL | 0 refills | Status: AC

## 2023-04-02 MED ORDER — SODIUM CHLORIDE 0.9 % IV SOLN
1.0000 g | Freq: Once | INTRAVENOUS | Status: AC
  Administered 2023-04-02: 1 g via INTRAVENOUS
  Filled 2023-04-02: qty 10

## 2023-04-02 MED ORDER — LIDOCAINE HCL (PF) 1 % IJ SOLN
INTRAMUSCULAR | Status: AC
  Filled 2023-04-02: qty 5

## 2023-04-02 MED ORDER — DOXYCYCLINE HYCLATE 100 MG PO TABS
100.0000 mg | ORAL_TABLET | Freq: Once | ORAL | Status: AC
  Administered 2023-04-02: 100 mg via ORAL
  Filled 2023-04-02: qty 1

## 2023-04-02 MED ORDER — CEFTRIAXONE SODIUM 500 MG IJ SOLR
500.0000 mg | Freq: Once | INTRAMUSCULAR | Status: DC
  Filled 2023-04-02: qty 500

## 2023-04-02 MED ORDER — LACTATED RINGERS IV BOLUS
1000.0000 mL | Freq: Once | INTRAVENOUS | Status: AC
  Administered 2023-04-02: 1000 mL via INTRAVENOUS

## 2023-04-02 NOTE — Discharge Instructions (Addendum)
You were seen in the emergency department for your rectal pain.  It appears that you have an infection of your rectum called proctitis and this is most commonly caused by STDs.  We have given you antibiotics and you should complete these as prescribed.  You should recommend that your partners to be tested and treated as well and abstain from sex for at least 7 days after completion of antibiotics to prevent reinfection.  You can take Tylenol and Motrin as needed for pain.  Your workup also showed that you had an increase of your kidney function from your usual and you should have your kidneys rechecked by your primary doctor within the next week.  After completion of your antibiotics you may need to follow-up with GI to make sure that the inflammation has cleared up in your rectum and you have no signs of any underlying masses and may need a colonoscopy.  You should return to the emergency department if you are having fevers despite the antibiotics, significantly worsening pain, repetitive vomiting and unable to tolerate the antibiotics or any other new or concerning symptoms.

## 2023-04-02 NOTE — ED Triage Notes (Addendum)
Patient reports pain in rectum since having anal sex one week ago.

## 2023-04-02 NOTE — ED Provider Notes (Signed)
EMERGENCY DEPARTMENT AT [facility] Provider Note   CSN: [id number] Arrival date & time: 04/02/23  0827     History  Chief Complaint  Patient presents with   Rectal Pain    [name] is a 37 y.o. male.  Patient is a 37 year old male with a past medical history of HIV on Edwyna Shell presenting to the emergency department with rectal pain.  Patient states that he had anal sex about 2 weeks ago and has had rectal pain since.  He states that the pain is constant whether or not he is having a bowel movement.  He denies any diarrhea or constipation but does report tenesmus.  He denies any black or bloody stools, discharge or drainage.  He denies any associated abdominal pain, fevers, nausea or vomiting.  He denies any dysuria or hematuria.  He states that he did use protection.  The history is provided by the patient.       Home Medications Prior to Admission medications   Medication Sig Start Date End Date Taking? Authorizing Provider  doxycycline (VIBRAMYCIN) 100 MG capsule Take 1 capsule (100 mg total) by mouth 2 (two) times daily for 7 days. 04/02/23 04/09/23 Yes Kingsley, Benetta Spar K, DO  bictegravir-emtricitabine-tenofovir AF (BIKTARVY) 50-200-25 MG TABS tablet Take 1 tablet by mouth daily. PLEASE CALL [phone] TO SCHEDULE AN APPOINTMENT 12/03/22   Cliffton Asters, MD  fluticasone [facility]) 50 MCG/ACT nasal spray Place into both nostrils daily. Patient not taking: Reported on 12/03/2022    [provider]  methocarbamol (ROBAXIN) 500 MG tablet Take 1 tablet (500 mg total) by mouth 2 (two) times daily. Patient not taking: Reported on 12/03/2022 12/20/18   Emi Holes, PA-C  oxymetazoline (AFRIN) 0.05 % nasal spray Place 1 spray into both nostrils 2 (two) times daily. Patient not taking: Reported on 12/03/2022    [provider]      Allergies    Patient has no known allergies.    Review of Systems   Review of Systems  Physical Exam Updated  Vital Signs BP (!) 168/119   Pulse 87   Temp 98.3 F (36.8 C) (Oral)   Resp 18   Ht 6\' 2"  (1.88 m)   Wt 95.3 kg   SpO2 98%   BMI 26.96 kg/m  Physical Exam Vitals and nursing note reviewed. Exam conducted with a chaperone present Building control surveyor).  Constitutional:      General: He is not in acute distress.    Appearance: Normal appearance.  HENT:     Head: Normocephalic and atraumatic.     Nose: Nose normal.     Mouth/Throat:     Mouth: Mucous membranes are moist.  Eyes:     Extraocular Movements: Extraocular movements intact.     Conjunctiva/sclera: Conjunctivae normal.  Cardiovascular:     Rate and Rhythm: Normal rate and regular rhythm.     Heart sounds: Normal heart sounds.  Pulmonary:     Effort: Pulmonary effort is normal.     Breath sounds: Normal breath sounds.  Abdominal:     General: Abdomen is flat.     Palpations: Abdomen is soft.     Tenderness: There is no abdominal tenderness.  Genitourinary:    Comments: No external hemorrhoids, mild point tenderness to palpation around the 5 o'clock position of anus with possible small fissure. No palpable fluctuance or induration Musculoskeletal:        General: Normal range of motion.     Cervical  back: Normal range of motion.  Skin:    General: Skin is warm.  Neurological:     General: No focal deficit present.     Mental Status: He is alert and oriented to person, place, and time.  Psychiatric:        Mood and Affect: Mood normal.        Behavior: Behavior normal.     ED Results / Procedures / Treatments   Labs (all labs ordered are listed, but only abnormal results are displayed) Labs Reviewed  URINALYSIS, W/ REFLEX TO CULTURE (INFECTION SUSPECTED) - Abnormal; Notable for the following components:      Result Value   Specific Gravity, Urine 1.032 (*)    Protein, ur >=300 (*)    All other components within normal limits  BASIC METABOLIC PANEL - Abnormal; Notable for the following components:   Glucose,  Bld 102 (*)    Creatinine, Ser 2.62 (*)    GFR, Estimated 31 (*)    All other components within normal limits  CBC WITH DIFFERENTIAL/PLATELET - Abnormal; Notable for the following components:   Platelets 142 (*)    All other components within normal limits  I-STAT CHEM 8, ED - Abnormal; Notable for the following components:   Creatinine, Ser 2.90 (*)    Glucose, Bld 102 (*)    Calcium, Ion 0.87 (*)    All other components within normal limits  GC/CHLAMYDIA PROBE AMP ([place]) NOT AT [facility]    EKG None  Radiology CT PELVIS W CONTRAST  Result Date: 04/02/2023 CLINICAL DATA:  Proctitis. EXAM: CT PELVIS WITH CONTRAST TECHNIQUE: Multidetector CT imaging of the pelvis was performed using the standard protocol following the bolus administration of intravenous contrast. RADIATION DOSE REDUCTION: This exam was performed according to the departmental dose-optimization program which includes automated exposure control, adjustment of the mA and/or kV according to patient size and/or use of iterative reconstruction technique. CONTRAST:  60mL OMNIPAQUE IOHEXOL 350 MG/ML SOLN COMPARISON:  Report CT 11/17/2013 FINDINGS: Urinary Tract:  Underdistended urinary bladder. Bowel: There is wall thickening with stranding along the rectum. Other loops of bowel in the pelvis are nondilated. There is some mild colonic stool. Vascular/Lymphatic: Preserved iliac vasculature. Few borderline enlarged perirectal nodes identified. Example posterior left on series 4, image 76 measures 6 by 11 mm. Additional node on the right side on image 4 image 73. No other definite abnormal lymph node enlargement identified in the pelvis. Reproductive:  No mass or other significant abnormality Other: Presacral fat stranding identified. Perirectal fat stranding. No free fluid or free air in the pelvis. Musculoskeletal: Partial sacralization left side of L5. IMPRESSION: Rectal wall thickening with the adjacent inflammatory stranding and  mildly enlarged adjacent perirectal lymph nodes. Differential includes infectious or inflammatory process. Neoplasm is in the differential as well. Please correlate with clinical findings and follow up evaluation. Electronically Signed   By: Karen Kays M.D.   On: 04/02/2023 11:56    Procedures Procedures    Medications Ordered in ED Medications  lidocaine (PF) (XYLOCAINE) 1 % injection (  Not Given 04/02/23 0926)  oxyCODONE-acetaminophen (PERCOCET/ROXICET) 5-325 MG per tablet 1 tablet (1 tablet Oral Given 04/02/23 0921)  doxycycline (VIBRA-TABS) tablet 100 mg (100 mg Oral Given 04/02/23 0922)  cefTRIAXone (ROCEPHIN) 1 g in sodium chloride 0.9 % 100 mL IVPB (0 g Intravenous Stopped 04/02/23 0952)  lactated ringers bolus 1,000 mL (0 mLs Intravenous Stopped 04/02/23 1126)  iohexol (OMNIPAQUE) 350 MG/ML injection 60 mL (60 mLs Intravenous  Contrast Given 04/02/23 1124)    ED Course/ Medical Decision Making/ A&P Clinical Course as of 04/02/23 1312  Thu Apr 02, 2023  1026 Cr 2.6 from baseline 1.8. He was given IVF. [VK]  1202 Rectal wall thickening likely due to infection but will be given GI follow up to r/o underlying malignancy after completion of treatment. Presumed secondary to STI and will be completed antibiotics. UA is pending. [VK]  1301 UA negative for UTI. Patient states he will be able to follow up with primary for rpt BMP. He is stable for discharge home with primary care follow up. [VK]    Clinical Course User Index [VK] Rexford Maus, DO                             Medical Decision Making This patient presents to the ED with chief complaint(s) of rectal pain with pertinent past medical history of HIV on HAART which further complicates the presenting complaint. The complaint involves an extensive differential diagnosis and also carries with it a high risk of complications and morbidity.    The differential diagnosis includes fissure, no evidence of hemorrhoids, STI,  prostatitis, proctitis, no evidence of cellulitis or abscess  Additional history obtained: Additional history obtained from N/A Records reviewed outpatient ID records  ED Course and Reassessment: On patient's arrival to the emergency department he was mildly tachycardic and uncomfortable appearing but otherwise hemodynamically stable in no acute distress.  Patient no evidence of obvious hemorrhoid or fissure but with his rectal pain and tenesmus with concern for possible prostatitis, proctitis or STI.  Patient will have GC chlamydia testing, urine and CT pelvis to evaluate for his symptoms.  He will be given Percocet for pain control and will be closely reassessed.  Independent labs interpretation:  The following labs were independently interpreted: AKI on CKD, otherwise within normal range  Independent visualization of imaging: - I independently visualized the following imaging with scope of interpretation limited to determining acute life threatening conditions related to emergency care: CTAP, which revealed rectal inflammation concerning for infection  Consultation: - Consulted or discussed management/test interpretation w/ external professional: N/A  Consideration for admission or further workup: Patient has no emergent conditions requiring admission or further work-up at this time and is stable for discharge home with primary care follow-up  Social Determinants of health: N/A    Amount and/or Complexity of Data Reviewed Labs: ordered. Radiology: ordered.  Risk Prescription drug management.          Final Clinical Impression(s) / ED Diagnoses Final diagnoses:  AKI (acute kidney injury) (HCC)  Proctitis    Rx / DC Orders ED Discharge Orders          Ordered    doxycycline (VIBRAMYCIN) 100 MG capsule  2 times daily        04/02/23 1310              [place], [place] K, [place] 04/02/23 1312

## 2023-04-03 LAB — GC/CHLAMYDIA PROBE AMP (~~LOC~~) NOT AT ARMC
Chlamydia: NEGATIVE
Comment: NEGATIVE
Comment: NORMAL
Neisseria Gonorrhea: NEGATIVE

## 2023-04-08 ENCOUNTER — Other Ambulatory Visit (HOSPITAL_COMMUNITY)
Admission: RE | Admit: 2023-04-08 | Discharge: 2023-04-08 | Disposition: A | Discharge status: Home / Self Care | Payer: BC Managed Care – PPO | Source: Ambulatory Visit | Attending: Internal Medicine | Admitting: Internal Medicine

## 2023-04-08 ENCOUNTER — Ambulatory Visit (INDEPENDENT_AMBULATORY_CARE_PROVIDER_SITE_OTHER): Payer: BC Managed Care – PPO | Admitting: Infectious Diseases

## 2023-04-08 ENCOUNTER — Other Ambulatory Visit: Payer: Self-pay

## 2023-04-08 ENCOUNTER — Encounter: Payer: Self-pay | Admitting: Infectious Diseases

## 2023-04-08 VITALS — BP 182/130 | HR 103 | Temp 98.5°F | Ht 73.0 in | Wt 223.0 lb

## 2023-04-08 DIAGNOSIS — Z79899 Other long term (current) drug therapy: Secondary | ICD-10-CM

## 2023-04-08 DIAGNOSIS — B20 Human immunodeficiency virus [HIV] disease: Secondary | ICD-10-CM

## 2023-04-08 DIAGNOSIS — Z113 Encounter for screening for infections with a predominantly sexual mode of transmission: Secondary | ICD-10-CM | POA: Diagnosis not present

## 2023-04-08 DIAGNOSIS — K6289 Other specified diseases of anus and rectum: Secondary | ICD-10-CM | POA: Insufficient documentation

## 2023-04-08 DIAGNOSIS — Z0000 Encounter for general adult medical examination without abnormal findings: Secondary | ICD-10-CM

## 2023-04-08 MED ORDER — BIKTARVY 50-200-25 MG PO TABS: 1.0000 | ORAL_TABLET | Freq: Every day | ORAL | 5 refills | Status: DC

## 2023-04-08 NOTE — Progress Notes (Signed)
[address], [place], [place], [address]                                                                  Phn. [phone]; Fax: [phone]                                                                             Date: 04/08/23  Reason for Visit: Routine HIV care.   HPI: [name] is a 37 y.o.old male with a history of HIV on Biktarvy, HCV ab reactive ( HCV RNA negative in 2015 and 2016), Depression, syphilis who is here for routine fu  Last seen on 12/03/22 by Dr Orvan Falconer. Lab Results  Component Value Date   HIV1RNAQUANT Not Detected 12/03/2022   Lab Results  Component Value Date   CD4TABS 509 12/03/2022   CD4TABS 383 (L) 06/27/2021   CD4TABS 417 03/26/2020   HIV h/o HIV Diagnosis in: 11/2013 How it was diagnosed: screening test How it was contracted: possible sexual encounter  Prior ART: stribild > Genvoya 09/19/2014 > Biktarvy in  2018> until now  Prior Genotype:    Interval hx/current visit: Taking biktarvy without any concerns or barriers to adherence of treatment. Seen in the ED 6/27 for rectal pain. 6/27 urine GC negative. CT abdomen/pelvis: Rectal wall thickening with the adjacent inflammatory stranding and mildly enlarged adjacent perirectal lymph nodes. Differential includes infectious or inflammatory process. Neoplasm is in the differential as well.  Discharged with 7 days of doxycyline and was given one dose of IM ceftriaxone. Reports rectal pain has resolved with no concerns like discharged, bleeding or abnormal BMs. He is sexually active with males only but denies recently. Does both oral as well as insertive and receptive sexual intercourse. Denies smoking, alcohol and IVDU. Works at [employer] Denies any appetite and weight loss. Does not fu with PCP. Willing  to see dentist. Refused urine GC. Interested in [place]. No complaints otherwise.   ROS: As stated in above HPI; all other systems were reviewed and are otherwise negative unless noted below  No reported fever / chills, night sweats, unintentional weight loss, acute visual change, odynophagia, chest pain/pressure, new or worsened SOB or WOB, nausea, vomiting, diarrhea, dysuria, GU discharge, syncope, seizures, red/hot swollen joints, hallucinations / delusions, rashes, new allergies, unusual / excessive bleeding, swollen lymph nodes, or new hospitalizations since the pt was last seen.  PMH/ PSH/ FamHx / Social Hx , medications and allergies reviewed and updated as appropriate; please see corresponding tab in EHR / prior notes  Current Outpatient Medications on File Prior to Visit  Medication Sig Dispense Refill   doxycycline (VIBRAMYCIN) 100 MG capsule Take 1 capsule (100 mg total) by mouth 2 (two) times daily for 7 days. (Patient not taking: Reported on 04/08/2023) 14 capsule 0   fluticasone (FLONASE) 50 MCG/ACT nasal spray Place into both nostrils daily. (Patient not taking: Reported on 12/03/2022)     methocarbamol (ROBAXIN) 500 MG tablet Take 1 tablet (500 mg total) by mouth 2 (two) times daily. (Patient not taking: Reported on 12/03/2022) 20 tablet 0   oxymetazoline (AFRIN) 0.05 % nasal spray Place 1 spray into both nostrils 2 (two) times daily. (Patient not taking: Reported on 12/03/2022)     No current facility-administered medications on file prior to visit.     No Known Allergies  Past Medical History:  Diagnosis Date   HIV (human immunodeficiency virus infection) (HCC)    ITP (idiopathic thrombocytopenic purpura) 11/24/2013    Past Surgical History:  Procedure Laterality Date   ADENOIDECTOMY N/A 11/16/2013   Procedure: ADENOIDECTOMY;  Surgeon: Flo Shanks, MD;  Location: [facility] OR;  Service: ENT;  Laterality: N/A;   MYRINGOTOMY WITH TUBE  PLACEMENT Bilateral 11/16/2013   Procedure: MYRINGOTOMY WITH TUBE PLACEMENT;  Surgeon: Flo Shanks, MD;  Location[facility] OR;  Service: ENT;  Laterality: Bilateral;   NASAL ENDOSCOPY WITH EPISTAXIS CONTROL N/A 11/16/2013   Procedure: NASAL ENDOSCOPY with nasopharyngeal biopsy with frozen section and adnoidectomy;  Surgeon: Flo Shanks, MD;  Location: [facility] OR;  Service: ENT;  Laterality: N/A;    Social History   Socioeconomic History   Marital status: Single    Spouse name: Not on file   Number of children: Not on file   Years of education: Not on file   Highest education level: Not on file  Occupational History   Not on file  Tobacco Use   Smoking status: Former    Packs/day: 0.10    Years: 12.00    Additional pack years: 0.00    Total pack years: 1.20    Types: Cigarettes    Start date: 12/30/2001    Quit date: 06/26/2014    Years since quitting: 8.7   Smokeless tobacco: Never   Tobacco comments:    pt states he wants to quit by his birthday in July  Vaping Use   Vaping Use: Every day  Substance and Sexual Activity   Alcohol use: No   Drug use: No   Sexual activity: Not Currently    Partners: Female, Male    Birth control/protection: Condom    Comment: declined condoms  Other Topics Concern   Not on file  Social History Narrative   Not on file   Social Determinants of Health   Financial Resource Strain: Not on file  Food Insecurity: Not on file  Transportation Needs: Not on file  Physical Activity: Not on file  Stress: Not on file  Social Connections: Not on file  Intimate Partner Violence: Not on file   Family History  Problem Relation Age of Onset   Hypertension Neg Hx    Diabetes Neg Hx     Vitals  BP (!) 182/130   Pulse (!) 103   Temp 98.5 F (36.9 C) (Oral)   Ht 6\' 1"  (1.854 m)   Wt 223 lb (101.2 kg)   SpO2 95%   BMI 29.42 kg/m   Examination  Gen: no acute distress HEENT: Economy/AT, no scleral icterus, no pale conjunctivae, hearing normal, oral  mucosa moist Neck:  Supple Cardio: Regular rate and rhythm Resp: Pulmonary effort normal in room air GI: nondistended GU: Musc: Extremities: No pedal edema Skin: No rashes Neuro: grossly non focal , awake, alert and oriented * 3  Psych: Calm, cooperative    Lab Results HIV 1 RNA Quant  Date Value  12/03/2022 Not Detected Copies/mL  06/27/2021 Not Detected Copies/mL  03/26/2020 <20 NOT DETECTED copies/mL   CD4 T Cell Abs (/uL)  Date Value  12/03/2022 509  06/27/2021 383 (L)  03/26/2020 417   No results found for: "HIV1GENOSEQ" Lab Results  Component Value Date   WBC 6.1 04/02/2023   HGB 14.6 04/02/2023   HCT 43.0 04/02/2023   MCV 88.8 04/02/2023   PLT 142 (L) 04/02/2023    Lab Results  Component Value Date   CREATININE 2.90 (H) 04/02/2023   BUN 19 04/02/2023   NA 137 04/02/2023   K 4.3 04/02/2023   CL 110 04/02/2023   CO2 25 04/02/2023   Lab Results  Component Value Date   ALT 10 12/03/2022   AST 19 12/03/2022   ALKPHOS 84 04/22/2017   BILITOT 0.7 12/03/2022    Lab Results  Component Value Date   CHOL 185 12/03/2022   TRIG 105 12/03/2022   HDL 45 12/03/2022   LDLCALC 119 (H) 12/03/2022   Lab Results  Component Value Date   HAV Reactive (A) 11/17/2013   Lab Results  Component Value Date   HEPBSAG NEGATIVE 11/18/2013   HEPBSAB POSITIVE (A) 11/17/2013   Lab Results  Component Value Date   HCVAB Reactive (A) 11/18/2013   Lab Results  Component Value Date   CHLAMYDIAWP Negative 04/02/2023   N Negative 04/02/2023   Lab Results  Component Value Date   GCPROBEAPT NEGATIVE 11/17/2013   Lab Results  Component Value Date   QUANTGOLD INDETERM 11/17/2013      Health Maintenance: Immunization History  Administered Date(s) Administered   Influenza,inj,Quad PF,6+ Mos 11/18/2013, 06/27/2014, 10/27/2017, 09/16/2018, 06/27/2021   Pfizer Covid-19 Vaccine Bivalent Booster 89yrs & up 06/27/2021   Pneumococcal Polysaccharide-23 11/18/2013   Tdap  11/04/2017, 12/20/2018    Assessment/Plan: # HIV Continue Biktarvy for now  Labs today Will d/w pharmacy regarding cabenuva   # CKD Amb referral to internal medicine to establish care  Avoid nephrotoxic agents  # Depression Appears to be stable with no reported SI/Hi Amb referral to internal medicine  # STD Screening  Prior rectal pain resolved s/p IM ceftriaxone, on doxycycline  Oral and anal GC, RPR Complete 7 days course of doxycyline  Stay abstinent for 7 days after completion of abtx course   # Rectal wall thickening/enlarged perirectal lymph nodes Likely 2/2 STI, has resolved  Refer to colo rectal surgery given HIV MSM greater than 35 years, neoplasm in differential  # Health maintenance Anal pap due, to be discussed  Dental referral placed today for [facility]. Information to schedule appointment completed today.   Patient's labs were reviewed as well as his previous records. Patients questions were addressed and answered. Safe sex counseling done.  I have personally spent 42 minutes involved in face-to-face and non-face-to-face activities for this patient on the day of the visit. Professional time spent includes the following activities: Preparing to see the patient (review of tests), Obtaining and/or reviewing separately obtained history (admission/discharge record), Performing a medically appropriate examination and/or evaluation , Ordering medications/tests/procedures, referring and communicating with other health care professionals, Documenting clinical information in the EMR, Independently interpreting results (not separately reported), Communicating  results to the patient/family/caregiver, Counseling and educating the patient/family/caregiver and Care coordination (not separately reported).    Electronically signed by:  Odette Fraction, MD Infectious Disease Physician [facility] [address] [place], [place] [address] Phone: [phone]  Fax: [phone]

## 2023-04-09 LAB — COMPREHENSIVE METABOLIC PANEL
AG Ratio: 1.1 (calc) (ref 1.0–2.5)
ALT: 9 U/L (ref 9–46)
Albumin: 3.5 g/dL — ABNORMAL LOW (ref 3.6–5.1)
BUN/Creatinine Ratio: 8 (calc) (ref 6–22)
CO2: 26 mmol/L (ref 20–32)
Chloride: 106 mmol/L (ref 98–110)
Creat: 2.5 mg/dL — ABNORMAL HIGH (ref 0.60–1.26)

## 2023-04-09 LAB — LIPID PANEL
HDL: 47 mg/dL (ref >=40)
Total CHOL/HDL Ratio: 4.3 (calc) (ref <5.0)

## 2023-04-10 LAB — CYTOLOGY, (ORAL, ANAL, URETHRAL) ANCILLARY ONLY
Chlamydia: NEGATIVE
Chlamydia: NEGATIVE
Comment: NEGATIVE
Comment: NEGATIVE
Comment: NORMAL
Comment: NORMAL
Neisseria Gonorrhea: NEGATIVE
Neisseria Gonorrhea: NEGATIVE

## 2023-04-13 ENCOUNTER — Telehealth: Payer: Self-pay

## 2023-04-13 LAB — HIV RNA, RTPCR W/R GT (RTI, PI,INT): HIV 1 RNA Quant: NOT DETECTED copies/mL

## 2023-04-13 NOTE — Telephone Encounter (Signed)
-----   Message from Odette Fraction, MD sent at 04/13/2023 10:00 AM EDT ----- Please let him know Labs are unremarkable Cr is down-trending. He has been referred to establish care with PCP for renal insufficiency LDL is 121 and needs to eat include more fruits and vegetables in diet, avoid fried food and regular exercise. He can fu with PCP

## 2023-04-13 NOTE — Telephone Encounter (Signed)
Spoke with patient, relayed that STI testing was negative. Encouraged follow up with primary care for elevated creatinine and LDL. Discussed that creatinine has come down some, but that it's still elevated. Also encouraged intake of fruits and vegetables, limiting fired food, and getting regular exercise. Patient verbalized understanding and has no further questions.   Sandie Ano, RN

## 2023-04-14 LAB — COMPREHENSIVE METABOLIC PANEL
AST: 22 U/L (ref 10–40)
Alkaline phosphatase (APISO): 99 U/L (ref 36–130)
BUN: 20 mg/dL (ref 7–25)
Calcium: 8.9 mg/dL (ref 8.6–10.3)
Globulin: 3.3 g/dL (calc) (ref 1.9–3.7)
Glucose, Bld: 92 mg/dL (ref 65–99)
Potassium: 3.9 mmol/L (ref 3.5–5.3)
Sodium: 140 mmol/L (ref 135–146)
Total Bilirubin: 0.8 mg/dL (ref 0.2–1.2)
Total Protein: 6.8 g/dL (ref 6.1–8.1)

## 2023-04-14 LAB — LIPID PANEL
Cholesterol: 202 mg/dL — ABNORMAL HIGH (ref <200)
LDL Cholesterol (Calc): 131 mg/dL (calc) — ABNORMAL HIGH
Non-HDL Cholesterol (Calc): 155 mg/dL (calc) — ABNORMAL HIGH (ref <130)
Triglycerides: 126 mg/dL (ref <150)

## 2023-04-14 LAB — T PALLIDUM AB: T Pallidum Abs: POSITIVE — AB

## 2023-04-14 LAB — HIV RNA, RTPCR W/R GT (RTI, PI,INT): HIV-1 RNA Quant, Log: NOT DETECTED {Log_copies}/mL

## 2023-04-14 LAB — RPR TITER: RPR Titer: 1:1 {titer} — ABNORMAL HIGH

## 2023-04-14 LAB — RPR: RPR Ser Ql: REACTIVE — AB

## 2023-04-21 ENCOUNTER — Telehealth: Payer: Self-pay | Admitting: Pharmacist

## 2023-04-21 ENCOUNTER — Telehealth: Payer: Self-pay

## 2023-04-21 ENCOUNTER — Other Ambulatory Visit (HOSPITAL_COMMUNITY): Payer: Self-pay

## 2023-04-21 NOTE — Telephone Encounter (Signed)
[facility] Patient Advocate Encounter   Received notification from [facility] that prior authorization for Renaldo Harrison is required. (PHARMACY BENEFITS)   PA submitted on 04/21/23 Key [id number] Status is pending    [facility] Clinic will continue to follow.   Clearance Coots, CPhT Specialty Pharmacy Patient [facility] for Infectious [facility] Phone: [phone] Fax:  [phone]

## 2023-04-21 NOTE — Telephone Encounter (Signed)
[name], patient is interested in [place] - can you look into insurance coverage?  Counseled that [place] is two separate intramuscular injections in the gluteal muscle on each side for each visit. Explained that the second injection is 30 days after the initial injection then every 2 months thereafter. Discussed the need for viral load monitoring every 2 months for the first 6 months and then periodically afterwards as their provider sees the need. Discussed the rare but significant chance of developing resistance despite compliance. Explained that showing up to injection appointments is very important and warned that if 2 appointments are missed, it will be reassessed by their provider whether they are a good candidate for injection therapy. Counseled on possible side effects associated with the injections such as injection site pain, which is usually mild to moderate in nature, injection site nodules, and injection site reactions. Asked to call the clinic or send me a mychart message if they experience any issues, such as fatigue, nausea, headache, rash, or dizziness. Advised that they can take ibuprofen or tylenol for injection site pain if needed.   Margarite Gouge, PharmD, CPP, BCIDP, AAHIVP Clinical Pharmacist Practitioner Infectious Diseases Clinical Pharmacist [facility] for Infectious Disease

## 2023-04-28 ENCOUNTER — Other Ambulatory Visit (HOSPITAL_COMMUNITY): Payer: Self-pay

## 2023-04-29 ENCOUNTER — Telehealth: Payer: Self-pay | Admitting: Pharmacist

## 2023-04-29 NOTE — Telephone Encounter (Signed)
Insurance denied initial request for [place] stating there was no documentation on why he could not tolerate Genvoya or Stribild. Sent appeal yesterday and will await response.  Margarite Gouge, PharmD, CPP, BCIDP, AAHIVP Clinical Pharmacist Practitioner Infectious Diseases Clinical Pharmacist [facility] for Infectious Disease

## 2023-05-06 ENCOUNTER — Other Ambulatory Visit (HOSPITAL_COMMUNITY): Payer: Self-pay

## 2023-05-06 ENCOUNTER — Other Ambulatory Visit: Payer: Self-pay

## 2023-05-06 ENCOUNTER — Other Ambulatory Visit: Payer: Self-pay | Admitting: Pharmacist

## 2023-05-06 DIAGNOSIS — B20 Human immunodeficiency virus [HIV] disease: Secondary | ICD-10-CM

## 2023-05-06 MED ORDER — CABOTEGRAVIR & RILPIVIRINE ER 600 & 900 MG/3ML IM SUER
1.0000 | INTRAMUSCULAR | 5 refills | Status: DC
Start: 2023-05-06 — End: 2024-06-20
  Filled 2023-05-06: qty 6, 60d supply, fill #0
  Filled 2023-08-10: qty 6, 30d supply, fill #0
  Filled 2023-10-05: qty 6, 30d supply, fill #1
  Filled 2023-12-03: qty 6, 30d supply, fill #2
  Filled 2024-02-04: qty 6, 30d supply, fill #3
  Filled 2024-04-18: qty 6, 30d supply, fill #4

## 2023-05-06 MED ORDER — CABOTEGRAVIR & RILPIVIRINE ER 600 & 900 MG/3ML IM SUER
1.0000 | INTRAMUSCULAR | 1 refills | Status: DC
Start: 2023-05-06 — End: 2023-06-24
  Filled 2023-05-06 (×2): qty 6, 30d supply, fill #0
  Filled 2023-06-01: qty 6, 30d supply, fill #1

## 2023-05-07 ENCOUNTER — Telehealth: Payer: Self-pay

## 2023-05-07 NOTE — Telephone Encounter (Signed)
[facility] Patient Advocate Encounter  Patient's medication Kern Reap) have been couriered to [facility] from [place] and will be administered on the patient next office visit.  Ileene Patrick , [facility] Patient [facility] for Infectious Disease Phone: [phone] Fax:  [phone]

## 2023-05-07 NOTE — Telephone Encounter (Signed)
Pharmacy Patient Advocate Encounter- Cabenuva BIV-Pharmacy Benefit:  PA was submitted to [facility] and has been approved through: 04/06/23-05/05/24 Authorization# [employer] [id number] A [id number]  Please send prescription to Specialty Pharmacy: [facility]: [phone]  Estimated Copay is: 0.00

## 2023-05-07 NOTE — Telephone Encounter (Signed)
LVM with patient about scheduling first appointment - thanks [name]!

## 2023-05-15 ENCOUNTER — Telehealth: Payer: Self-pay

## 2023-05-15 NOTE — Telephone Encounter (Signed)
Dental Referral Follow-up  [name]  37 y.o., male    Referral Date: 04/08/2023   Seen Date: pending  Dental Contact: [facility] @ [phone]       Dental referral has been placed to for [facility]. They have been trying to reach out to patient via phone. No alternate phone number listed by pharmacy.  Will will continue follow up to see if patient prefers to be seen with Dental partner within the clinic.    Valarie Cones, LPN

## 2023-05-18 ENCOUNTER — Other Ambulatory Visit (HOSPITAL_COMMUNITY): Payer: Self-pay

## 2023-05-20 ENCOUNTER — Ambulatory Visit: Payer: BC Managed Care – PPO | Admitting: Pharmacist

## 2023-05-26 NOTE — Progress Notes (Incomplete)
HPI: [name] is a 37 y.o. male who presents to the [facility] pharmacy clinic for [place] administration.  Patient Active Problem List   Diagnosis Date Noted   HIV disease (HCC) 04/08/2023   Screening examination for venereal disease 04/08/2023   Rectal pain 04/08/2023   Encounter for long-term (current) use of medications 04/08/2023   Latent syphilis 11/11/2022   Renal insufficiency 06/15/2018   Healthcare maintenance 11/04/2017   Depression 10/27/2017   Former smoker 12/08/2013   Positive hepatitis C antibody test 11/18/2013   Thrombocytopenia, unspecified (HCC) 11/17/2013   Epistaxis 11/17/2013   Normocytic anemia 11/17/2013   Chronic otitis media 11/17/2013   Transaminitis 11/17/2013   Nasopharyngeal mass 11/16/2013    Patient's Medications  New Prescriptions   No medications on file  Previous Medications   BICTEGRAVIR-EMTRICITABINE-TENOFOVIR AF (BIKTARVY) 50-200-25 MG TABS TABLET    Take 1 tablet by mouth daily. PLEASE CALL [phone] TO SCHEDULE AN APPOINTMENT   CABOTEGRAVIR & RILPIVIRINE ER (CABENUVA) 600 & 900 MG/3ML INJECTION    Inject 1 kit into the muscle every 2 (two) months.   CABOTEGRAVIR & RILPIVIRINE ER (CABENUVA) 600 & 900 MG/3ML INJECTION    Inject 1 kit into the muscle every 30 (thirty) days.   FLUTICASONE (FLONASE) 50 MCG/ACT NASAL SPRAY    Place into both nostrils daily.   METHOCARBAMOL (ROBAXIN) 500 MG TABLET    Take 1 tablet (500 mg total) by mouth 2 (two) times daily.   OXYMETAZOLINE (AFRIN) 0.05 % NASAL SPRAY    Place 1 spray into both nostrils 2 (two) times daily.  Modified Medications   No medications on file  Discontinued Medications   No medications on file    Allergies: No Known Allergies  Labs: Lab Results  Component Value Date   HIV1RNAQUANT NOT DETECTED 04/08/2023   HIV1RNAQUANT Not Detected 12/03/2022   HIV1RNAQUANT Not Detected 06/27/2021   CD4TABS 509 12/03/2022   CD4TABS 383 (L) 06/27/2021   CD4TABS 417 03/26/2020    RPR  and STI Lab Results  Component Value Date   LABRPR REACTIVE (A) 04/08/2023   LABRPR REACTIVE (A) 12/03/2022   LABRPR REACTIVE (A) 06/27/2021   LABRPR NON-REACTIVE 03/26/2020   LABRPR NON-REACTIVE 03/31/2019   RPRTITER 1:1 (H) 04/08/2023   RPRTITER 1:2 (H) 12/03/2022   RPRTITER 1:2 (H) 06/27/2021    STI Results GC GC CT CT  Latest Ref Rng & Units  NEGATIVE  NEGATIVE  04/08/2023 10:25 AM Negative    Negative   Negative    Negative    04/02/2023  8:52 AM Negative   Negative    09/01/2018 12:00 AM Negative   Negative    04/22/2017 12:00 AM Negative   Negative    11/17/2013  2:00 PM  NEGATIVE   NEGATIVE     Hepatitis B Lab Results  Component Value Date   HEPBSAB POSITIVE (A) 11/17/2013   HEPBSAG NEGATIVE 11/18/2013   Hepatitis C No results found for: "HEPCAB", "HCVRNAPCRQN" Hepatitis A Lab Results  Component Value Date   HAV Reactive (A) 11/17/2013   Lipids: Lab Results  Component Value Date   CHOL 202 (H) 04/08/2023   TRIG 126 04/08/2023   HDL 47 04/08/2023   CHOLHDL 4.3 04/08/2023   VLDL 40 (H) 04/22/2017   LDLCALC 131 (H) 04/08/2023    TARGET DATE: The 20th of the month  Assessment: Aram presents today for his maintenance Cabenuva injections. Past injections were tolerated well without issues.  Administered cabotegravir 600mg /30mL in left upper outer quadrant  of the gluteal muscle. Administered rilpivirine 900 mg/37mL in the right upper outer quadrant of the gluteal muscle. No issues with injections. He will follow up in 2 months for next set of injections.  Plan: - Cabenuva injections administered - Next injections scheduled for *** - Call with any issues or questions  Lennie Muckle, PharmD PGY1 Pharmacy Resident 05/26/2023 5:10 PM

## 2023-05-27 ENCOUNTER — Ambulatory Visit (INDEPENDENT_AMBULATORY_CARE_PROVIDER_SITE_OTHER): Payer: BC Managed Care – PPO | Admitting: Pharmacist

## 2023-05-27 ENCOUNTER — Other Ambulatory Visit: Payer: Self-pay

## 2023-05-27 DIAGNOSIS — B20 Human immunodeficiency virus [HIV] disease: Secondary | ICD-10-CM

## 2023-05-27 DIAGNOSIS — Z23 Encounter for immunization: Secondary | ICD-10-CM | POA: Diagnosis not present

## 2023-05-27 MED ORDER — CABOTEGRAVIR & RILPIVIRINE ER 600 & 900 MG/3ML IM SUER
1.0000 | Freq: Once | INTRAMUSCULAR | Status: AC
Start: 2023-05-27 — End: 2023-05-27
  Administered 2023-05-27: 1 via INTRAMUSCULAR

## 2023-05-27 NOTE — Progress Notes (Signed)
HPI: [name] is a 37 y.o. male who presents to the [facility] pharmacy clinic for [place] administration.  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   CABOTEGRAVIR & RILPIVIRINE ER (CABENUVA) 600 & 900 MG/3ML INJECTION    Inject 1 kit into the muscle every 30 (thirty) days.   FLUTICASONE (FLONASE) 50 MCG/ACT NASAL SPRAY    Place into both nostrils daily.   METHOCARBAMOL (ROBAXIN) 500 MG TABLET    Take 1 tablet (500 mg total) by mouth 2 (two) times daily.   OXYMETAZOLINE (AFRIN) 0.05 % NASAL SPRAY    Place 1 spray into both nostrils 2 (two) times daily.  Modified Medications   No medications on file  Discontinued Medications   BICTEGRAVIR-EMTRICITABINE-TENOFOVIR AF (BIKTARVY) 50-200-25 MG TABS TABLET    Take 1 tablet by mouth daily. PLEASE CALL [phone] TO SCHEDULE AN APPOINTMENT    Allergies: No Known Allergies  Labs: Lab Results  Component Value Date   HIV1RNAQUANT NOT DETECTED 04/08/2023   HIV1RNAQUANT Not Detected 12/03/2022   HIV1RNAQUANT Not Detected 06/27/2021   CD4TABS 509 12/03/2022   CD4TABS 383 (L) 06/27/2021   CD4TABS 417 03/26/2020    RPR and STI Lab Results   Component Value Date   LABRPR REACTIVE (A) 04/08/2023   LABRPR REACTIVE (A) 12/03/2022   LABRPR REACTIVE (A) 06/27/2021   LABRPR NON-REACTIVE 03/26/2020   LABRPR NON-REACTIVE 03/31/2019   RPRTITER 1:1 (H) 04/08/2023   RPRTITER 1:2 (H) 12/03/2022   RPRTITER 1:2 (H) 06/27/2021    STI Results GC GC CT CT  Latest Ref Rng & Units  NEGATIVE  NEGATIVE  04/08/2023 10:25 AM Negative    Negative   Negative    Negative    04/02/2023  8:52 AM Negative   Negative    09/01/2018 12:00 AM Negative   Negative    04/22/2017 12:00 AM Negative   Negative    11/17/2013  2:00 PM  NEGATIVE   NEGATIVE     Hepatitis B Lab Results  Component Value Date   HEPBSAB POSITIVE (A) 11/17/2013   HEPBSAG NEGATIVE 11/18/2013   Hepatitis C No results found for: "HEPCAB", "HCVRNAPCRQN" Hepatitis A Lab Results  Component Value Date   HAV Reactive (A) 11/17/2013   Lipids: Lab Results  Component Value Date   CHOL 202 (H) 04/08/2023   TRIG 126 04/08/2023   HDL 47 04/08/2023   CHOLHDL 4.3 04/08/2023   VLDL 40 (H) 04/22/2017   LDLCALC 131 (H) 04/08/2023    Current HIV Regimen: Biktarvy  TARGET DATE: The 21st of the month  Assessment: [name] presents today for his first initiation injection for Cabenuva. He did miss his first appointment due to being at court, so was rescheduled to  today. Counseled that [name] is two separate intramuscular injections in the gluteal muscle on each side for each visit. Explained that the second injection is 30 days after the initial injection then every 2 months thereafter. Discussed the rare but significant chance of developing resistance despite compliance. Explained that showing up to injection appointments is very important and warned that if 2 appointments are missed, it will be reassessed by their provider whether they are a good candidate for injection therapy. Counseled on possible side effects associated with the injections such as injection site pain, which is  usually mild to moderate in nature, injection site nodules, and injection site reactions. Asked to call the clinic or send me a mychart message if they experience any issues, such as fatigue, nausea, headache, rash, or dizziness. Advised that they can take ibuprofen or tylenol for injection site pain if needed. [name] did sign a Patent examiner today after reviewing the expectations for coming to appointments and being compliant with injections.  Administered cabotegravir 600mg /32mL in left upper outer quadrant of the gluteal muscle. Administered rilpivirine 900 mg/84mL in the right upper outer quadrant of the gluteal muscle. Monitored patient for 10 minutes after injection. Injections were tolerated well without issue. Counseled to stop taking Biktarvy after today's dose and to call with any issues that may arise. Will make follow up appointments for second initiation injection in 30 days and then maintenance injections every 2 months thereafter.   He was due for both the PCV20 and a meningococcal booster today. He was agreeable to getting both of these injections today.   Plan: - Stop Biktarvy after today's dose - Received both PCV20 and meningococcal vaccines today.  - First Cabenuva injections administered - Second initiation injection scheduled for 9/18 with Marchelle Folks - Call with any issues or questions  Lennie Muckle, PharmD PGY1 Pharmacy Resident 05/27/2023 4:52 PM

## 2023-06-01 ENCOUNTER — Other Ambulatory Visit (HOSPITAL_COMMUNITY): Payer: Self-pay

## 2023-06-12 ENCOUNTER — Telehealth: Payer: Self-pay

## 2023-06-12 NOTE — Telephone Encounter (Signed)
[facility] Patient Advocate Encounter  Patient's medication Renaldo Harrison) have been couriered to [facility] from [facility] and will be administered on the patient next office visit on 06/24/23.  Clearance Coots , CPhT Specialty Pharmacy Patient [facility] Phone: [phone] Fax:  [phone]

## 2023-06-24 ENCOUNTER — Ambulatory Visit (INDEPENDENT_AMBULATORY_CARE_PROVIDER_SITE_OTHER): Payer: BC Managed Care – PPO | Admitting: Pharmacist

## 2023-06-24 ENCOUNTER — Other Ambulatory Visit: Payer: Self-pay

## 2023-06-24 DIAGNOSIS — B20 Human immunodeficiency virus [HIV] disease: Secondary | ICD-10-CM | POA: Diagnosis not present

## 2023-06-24 DIAGNOSIS — Z23 Encounter for immunization: Secondary | ICD-10-CM | POA: Diagnosis not present

## 2023-06-24 MED ORDER — CABOTEGRAVIR & RILPIVIRINE ER 600 & 900 MG/3ML IM SUER
1.0000 | Freq: Once | INTRAMUSCULAR | Status: AC
Start: 2023-06-24 — End: 2023-06-24
  Administered 2023-06-24: 1 via INTRAMUSCULAR

## 2023-06-24 NOTE — Progress Notes (Signed)
HPI: [name] is a 37 y.o. male who presents to the [facility] pharmacy clinic for [place] administration.  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   FLUTICASONE (FLONASE) 50 MCG/ACT NASAL SPRAY    Place into both nostrils daily.   METHOCARBAMOL (ROBAXIN) 500 MG TABLET    Take 1 tablet (500 mg total) by mouth 2 (two) times daily.   OXYMETAZOLINE (AFRIN) 0.05 % NASAL SPRAY    Place 1 spray into both nostrils 2 (two) times daily.  Modified Medications   No medications on file  Discontinued Medications   CABOTEGRAVIR & RILPIVIRINE ER (CABENUVA) 600 & 900 MG/3ML INJECTION    Inject 1 kit into the muscle every 30 (thirty) days.    Allergies: No Known Allergies  Past Medical History: Past Medical History:  Diagnosis Date   HIV (human immunodeficiency virus infection) (HCC)    ITP (idiopathic thrombocytopenic purpura) 11/24/2013    Social History: Social History   Socioeconomic History   Marital status: Single    Spouse name: Not on file   Number of children: Not on file   Years of education: Not on file   Highest education level: Not on file  Occupational History   Not on file   Tobacco Use   Smoking status: Former    Current packs/day: 0.00    Average packs/day: 0.1 packs/day for 12.5 years (1.2 ttl pk-yrs)    Types: Cigarettes    Start date: 12/30/2001    Quit date: 06/26/2014    Years since quitting: 9.0   Smokeless tobacco: Never   Tobacco comments:    pt states he wants to quit by his birthday in July  Vaping Use   Vaping status: Every Day  Substance and Sexual Activity   Alcohol use: No   Drug use: No   Sexual activity: Not Currently    Partners: Female, Male    Birth control/protection: Condom    Comment: declined condoms  Other Topics Concern   Not on file  Social History Narrative   Not on file   Social Determinants of Health   Financial Resource Strain: Not on file  Food Insecurity: Not on file  Transportation Needs: Not on file  Physical Activity: Not on file  Stress: Not on file  Social Connections: Not on file    Labs: Lab Results  Component Value Date   HIV1RNAQUANT NOT DETECTED 04/08/2023   HIV1RNAQUANT Not Detected 12/03/2022   HIV1RNAQUANT Not Detected 06/27/2021   CD4TABS 509 12/03/2022   CD4TABS 383 (L) 06/27/2021   CD4TABS 417 03/26/2020    RPR and STI Lab Results  Component Value Date   LABRPR REACTIVE (A) 04/08/2023   LABRPR REACTIVE (A) 12/03/2022  LABRPR REACTIVE (A) 06/27/2021   LABRPR NON-REACTIVE 03/26/2020   LABRPR NON-REACTIVE 03/31/2019   RPRTITER 1:1 (H) 04/08/2023   RPRTITER 1:2 (H) 12/03/2022   RPRTITER 1:2 (H) 06/27/2021    STI Results GC GC CT CT  Latest Ref Rng & Units  NEGATIVE  NEGATIVE  04/08/2023 10:25 AM Negative    Negative   Negative    Negative    04/02/2023  8:52 AM Negative   Negative    09/01/2018 12:00 AM Negative   Negative    04/22/2017 12:00 AM Negative   Negative    11/17/2013  2:00 PM  NEGATIVE   NEGATIVE     Hepatitis B Lab Results  Component Value Date   HEPBSAB POSITIVE (A) 11/17/2013   HEPBSAG NEGATIVE 11/18/2013   Hepatitis C No results found for: "HEPCAB",  "HCVRNAPCRQN" Hepatitis A Lab Results  Component Value Date   HAV Reactive (A) 11/17/2013   Lipids: Lab Results  Component Value Date   CHOL 202 (H) 04/08/2023   TRIG 126 04/08/2023   HDL 47 04/08/2023   CHOLHDL 4.3 04/08/2023   VLDL 40 (H) 04/22/2017   LDLCALC 131 (H) 04/08/2023    TARGET DATE:  The 21st of the month  Assessment: [name] presents today for their maintenance Cabenuva injections. Initial/past injections were tolerated well without issues. No problems with systemic effects of injections. Patient did experience mild soreness for ~48 hours.   Administered cabotegravir 600mg /16mL in left upper outer quadrant of the gluteal muscle. Administered rilpivirine 900 mg/50mL in the right upper outer quadrant of the gluteal muscle. Monitored patient for 10 minutes after injection. Injections were tolerated well without issue. Patient will follow up in 2 months for next injection. Will check HIV RNA today.  Politely declines STI testing today and denies any sexual activity since last visit. Due for flu, COVID, and HPV. Politely declined flu and COVID but agreed to 1st HPV vaccination today; 2nd HPV vaccination due at next visit and 3rd in 6 months.  Plan: - Cabenuva injections administered - Check HIV RNA  - Administered HPV 1/3  - Next injections scheduled for 11/20 with me, 1/15 with Dr. Elinor Parkinson, and 3/19 with me  - Call with any issues or questions  Margarite Gouge, PharmD, CPP, BCIDP, AAHIVP Clinical Pharmacist Practitioner Infectious Diseases Clinical Pharmacist [facility]

## 2023-06-26 LAB — HIV-1 RNA QUANT-NO REFLEX-BLD
HIV 1 RNA Quant: NOT DETECTED Copies/mL
HIV-1 RNA Quant, Log: NOT DETECTED Log cps/mL

## 2023-08-06 ENCOUNTER — Other Ambulatory Visit: Payer: Self-pay

## 2023-08-10 ENCOUNTER — Other Ambulatory Visit: Payer: Self-pay

## 2023-08-10 ENCOUNTER — Other Ambulatory Visit (HOSPITAL_COMMUNITY): Payer: Self-pay

## 2023-08-10 NOTE — Progress Notes (Signed)
Specialty Pharmacy Refill Coordination Note  [name] is a 37 y.o. male assessed today regarding refills of clinic administered specialty medication(s) Cabotegravir & Rilpivirine   Clinic requested Courier to Provider Office   Delivery date: 08/20/23   Verified address: [facility] [address] [place] [address]   Medication will be filled on 08/19/23.

## 2023-08-19 ENCOUNTER — Other Ambulatory Visit: Payer: Self-pay

## 2023-08-19 ENCOUNTER — Ambulatory Visit: Payer: Self-pay | Admitting: Pharmacist

## 2023-08-20 ENCOUNTER — Telehealth: Payer: Self-pay

## 2023-08-20 NOTE — Telephone Encounter (Signed)
[facility] Patient Advocate Encounter  Patient's medications CABENUVA have been couriered to [facility] from [facility] and will be administered at the patients appointment on 08/26/23.  Kae Heller, CPhT Specialty Pharmacy Patient [facility] for Infectious Disease Phone: [phone] Fax:  [phone]

## 2023-08-25 NOTE — Progress Notes (Unsigned)
HPI: [name] is a 37 y.o. male who presents to the [facility] pharmacy clinic for [place] administration.  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   FLUTICASONE (FLONASE) 50 MCG/ACT NASAL SPRAY    Place into both nostrils daily.   METHOCARBAMOL (ROBAXIN) 500 MG TABLET    Take 1 tablet (500 mg total) by mouth 2 (two) times daily.   OXYMETAZOLINE (AFRIN) 0.05 % NASAL SPRAY    Place 1 spray into both nostrils 2 (two) times daily.  Modified Medications   No medications on file  Discontinued Medications   No medications on file    Allergies: No Known Allergies  Labs: Lab Results  Component Value Date   HIV1RNAQUANT Not Detected 06/24/2023   HIV1RNAQUANT NOT DETECTED 04/08/2023   HIV1RNAQUANT Not Detected 12/03/2022   CD4TABS 509 12/03/2022   CD4TABS 383 (L) 06/27/2021   CD4TABS 417 03/26/2020    RPR and STI Lab Results  Component Value Date   LABRPR REACTIVE (A) 04/08/2023   LABRPR REACTIVE (A) 12/03/2022   LABRPR REACTIVE (A) 06/27/2021   LABRPR NON-REACTIVE 03/26/2020   LABRPR NON-REACTIVE 03/31/2019   RPRTITER 1:1 (H) 04/08/2023   RPRTITER 1:2 (H) 12/03/2022   RPRTITER  1:2 (H) 06/27/2021    STI Results GC GC CT CT  Latest Ref Rng & Units  NEGATIVE  NEGATIVE  04/08/2023 10:25 AM Negative    Negative   Negative    Negative    04/02/2023  8:52 AM Negative   Negative    09/01/2018 12:00 AM Negative   Negative    04/22/2017 12:00 AM Negative   Negative    11/17/2013  2:00 PM  NEGATIVE   NEGATIVE     Hepatitis B Lab Results  Component Value Date   HEPBSAB POSITIVE (A) 11/17/2013   HEPBSAG NEGATIVE 11/18/2013   Hepatitis C No results found for: "HEPCAB", "HCVRNAPCRQN" Hepatitis A Lab Results  Component Value Date   HAV Reactive (A) 11/17/2013   Lipids: Lab Results  Component Value Date   CHOL 202 (H) 04/08/2023   TRIG 126 04/08/2023   HDL 47 04/08/2023   CHOLHDL 4.3 04/08/2023   VLDL 40 (H) 04/22/2017   LDLCALC 131 (H) 04/08/2023    TARGET DATE: The 21st of the month  Assessment: [name] presents today for his maintenance Cabenuva injections. Past injections were tolerated well without issues. Last provider visit with Dr. Elinor Parkinson on 04/08/23, where he was undetectable with preserved CD4. Next provider follow up scheduled on 10/21/23. [name] declines STD testing today.  Due for COVID, influenza, HPV 2/3 today. [name] will get these today.  Administered cabotegravir 600mg /11mL in left upper outer quadrant of the gluteal muscle. Administered rilpivirine 900 mg/52mL in the  right upper outer quadrant of the gluteal muscle. No issues with injections. [name] will follow up in 2 months for next set of injections.  Plan: - Cabenuva injections administered - HIV RNA today - Immunizations: Influenza, COVID, and HPV 2/3 today - Next injections scheduled for 10/21/23 with Dr. Elinor Parkinson and 12/23/23 with Marchelle Folks - Call with any issues or questions  Lora Paula, PharmD PGY-2 Infectious Diseases Pharmacy Resident [facility] 08/25/2023 7:04 PM

## 2023-08-26 ENCOUNTER — Ambulatory Visit: Payer: BC Managed Care – PPO | Admitting: Pharmacist

## 2023-08-26 ENCOUNTER — Other Ambulatory Visit: Payer: Self-pay

## 2023-08-26 DIAGNOSIS — B20 Human immunodeficiency virus [HIV] disease: Secondary | ICD-10-CM

## 2023-08-26 DIAGNOSIS — Z23 Encounter for immunization: Secondary | ICD-10-CM

## 2023-08-26 MED ORDER — CABOTEGRAVIR & RILPIVIRINE ER 600 & 900 MG/3ML IM SUER
1.0000 | Freq: Once | INTRAMUSCULAR | Status: AC
Start: 2023-08-26 — End: 2023-08-26
  Administered 2023-08-26: 1 via INTRAMUSCULAR

## 2023-08-29 LAB — HIV-1 RNA QUANT-NO REFLEX-BLD
HIV 1 RNA Quant: NOT DETECTED {copies}/mL
HIV-1 RNA Quant, Log: NOT DETECTED {Log_copies}/mL

## 2023-10-05 ENCOUNTER — Other Ambulatory Visit: Payer: Self-pay

## 2023-10-05 ENCOUNTER — Other Ambulatory Visit (HOSPITAL_COMMUNITY): Payer: Self-pay

## 2023-10-05 NOTE — Progress Notes (Signed)
Specialty Pharmacy Refill Coordination Note  [name] is a 36 y.o. male assessed today regarding refills of clinic administered specialty medication(s) Cabotegravir & Rilpivirine [facility])   Clinic requested Courier to Provider Office   Delivery date: 10/08/23   Verified address: [address] [place] [address]   Medication will be filled on 10/06/23.

## 2023-10-06 ENCOUNTER — Other Ambulatory Visit: Payer: Self-pay

## 2023-10-08 ENCOUNTER — Telehealth: Payer: Self-pay

## 2023-10-08 NOTE — Telephone Encounter (Signed)
 [facility] Patient Advocate Encounter  Patient's medications CABENUVA  have been couriered to [facility] from [facility] and will be administered at the patients appointment on 08/10/24.  Charmaine Sharps, CPhT Specialty Pharmacy Patient [facility] for Infectious Disease Phone: [phone] Fax:  [phone]

## 2023-10-15 ENCOUNTER — Encounter (HOSPITAL_COMMUNITY): Payer: Self-pay | Admitting: Emergency Medicine

## 2023-10-15 ENCOUNTER — Other Ambulatory Visit: Payer: Self-pay

## 2023-10-15 ENCOUNTER — Inpatient Hospital Stay (HOSPITAL_COMMUNITY)
Admission: EM | Admit: 2023-10-15 | Discharge: 2023-10-20 | DRG: 286 | Disposition: A | Discharge status: Home / Self Care | Payer: BC Managed Care – PPO | Attending: Internal Medicine | Admitting: Internal Medicine

## 2023-10-15 ENCOUNTER — Emergency Department (HOSPITAL_COMMUNITY): Payer: BC Managed Care – PPO

## 2023-10-15 DIAGNOSIS — I130 Hypertensive heart and chronic kidney disease with heart failure and stage 1 through stage 4 chronic kidney disease, or unspecified chronic kidney disease: Principal | ICD-10-CM | POA: Diagnosis present

## 2023-10-15 DIAGNOSIS — R7989 Other specified abnormal findings of blood chemistry: Secondary | ICD-10-CM | POA: Diagnosis present

## 2023-10-15 DIAGNOSIS — Z6832 Body mass index (BMI) 32.0-32.9, adult: Secondary | ICD-10-CM

## 2023-10-15 DIAGNOSIS — Z79899 Other long term (current) drug therapy: Secondary | ICD-10-CM

## 2023-10-15 DIAGNOSIS — I361 Nonrheumatic tricuspid (valve) insufficiency: Secondary | ICD-10-CM | POA: Diagnosis not present

## 2023-10-15 DIAGNOSIS — Z841 Family history of disorders of kidney and ureter: Secondary | ICD-10-CM

## 2023-10-15 DIAGNOSIS — G479 Sleep disorder, unspecified: Secondary | ICD-10-CM | POA: Diagnosis present

## 2023-10-15 DIAGNOSIS — R059 Cough, unspecified: Secondary | ICD-10-CM | POA: Diagnosis not present

## 2023-10-15 DIAGNOSIS — I340 Nonrheumatic mitral (valve) insufficiency: Secondary | ICD-10-CM | POA: Diagnosis not present

## 2023-10-15 DIAGNOSIS — R809 Proteinuria, unspecified: Secondary | ICD-10-CM | POA: Diagnosis present

## 2023-10-15 DIAGNOSIS — B20 Human immunodeficiency virus [HIV] disease: Secondary | ICD-10-CM | POA: Diagnosis not present

## 2023-10-15 DIAGNOSIS — R0602 Shortness of breath: Secondary | ICD-10-CM | POA: Diagnosis not present

## 2023-10-15 DIAGNOSIS — D72819 Decreased white blood cell count, unspecified: Secondary | ICD-10-CM | POA: Diagnosis present

## 2023-10-15 DIAGNOSIS — N1832 Chronic kidney disease, stage 3b: Secondary | ICD-10-CM | POA: Diagnosis not present

## 2023-10-15 DIAGNOSIS — Z8249 Family history of ischemic heart disease and other diseases of the circulatory system: Secondary | ICD-10-CM | POA: Diagnosis not present

## 2023-10-15 DIAGNOSIS — I509 Heart failure, unspecified: Secondary | ICD-10-CM | POA: Diagnosis not present

## 2023-10-15 DIAGNOSIS — R601 Generalized edema: Secondary | ICD-10-CM | POA: Diagnosis present

## 2023-10-15 DIAGNOSIS — E66811 Obesity, class 1: Secondary | ICD-10-CM | POA: Diagnosis present

## 2023-10-15 DIAGNOSIS — R000 Tachycardia, unspecified: Secondary | ICD-10-CM | POA: Diagnosis present

## 2023-10-15 DIAGNOSIS — N184 Chronic kidney disease, stage 4 (severe): Secondary | ICD-10-CM | POA: Diagnosis present

## 2023-10-15 DIAGNOSIS — F32A Depression, unspecified: Secondary | ICD-10-CM | POA: Diagnosis present

## 2023-10-15 DIAGNOSIS — I5031 Acute diastolic (congestive) heart failure: Secondary | ICD-10-CM | POA: Diagnosis not present

## 2023-10-15 DIAGNOSIS — I5021 Acute systolic (congestive) heart failure: Secondary | ICD-10-CM | POA: Diagnosis present

## 2023-10-15 DIAGNOSIS — Q211 Atrial septal defect, unspecified: Secondary | ICD-10-CM | POA: Diagnosis not present

## 2023-10-15 DIAGNOSIS — I161 Hypertensive emergency: Secondary | ICD-10-CM | POA: Diagnosis not present

## 2023-10-15 DIAGNOSIS — N179 Acute kidney failure, unspecified: Secondary | ICD-10-CM | POA: Diagnosis not present

## 2023-10-15 DIAGNOSIS — Z21 Asymptomatic human immunodeficiency virus [HIV] infection status: Secondary | ICD-10-CM | POA: Diagnosis present

## 2023-10-15 DIAGNOSIS — Z1152 Encounter for screening for COVID-19: Secondary | ICD-10-CM

## 2023-10-15 DIAGNOSIS — J811 Chronic pulmonary edema: Secondary | ICD-10-CM | POA: Diagnosis not present

## 2023-10-15 DIAGNOSIS — I5041 Acute combined systolic (congestive) and diastolic (congestive) heart failure: Secondary | ICD-10-CM | POA: Diagnosis not present

## 2023-10-15 DIAGNOSIS — I3139 Other pericardial effusion (noninflammatory): Secondary | ICD-10-CM | POA: Diagnosis not present

## 2023-10-15 DIAGNOSIS — J810 Acute pulmonary edema: Secondary | ICD-10-CM

## 2023-10-15 DIAGNOSIS — R600 Localized edema: Secondary | ICD-10-CM | POA: Diagnosis not present

## 2023-10-15 DIAGNOSIS — R0989 Other specified symptoms and signs involving the circulatory and respiratory systems: Secondary | ICD-10-CM | POA: Diagnosis not present

## 2023-10-15 DIAGNOSIS — F17290 Nicotine dependence, other tobacco product, uncomplicated: Secondary | ICD-10-CM | POA: Diagnosis present

## 2023-10-15 DIAGNOSIS — Z8619 Personal history of other infectious and parasitic diseases: Secondary | ICD-10-CM | POA: Diagnosis not present

## 2023-10-15 DIAGNOSIS — I43 Cardiomyopathy in diseases classified elsewhere: Secondary | ICD-10-CM | POA: Diagnosis present

## 2023-10-15 DIAGNOSIS — R6510 Systemic inflammatory response syndrome (SIRS) of non-infectious origin without acute organ dysfunction: Secondary | ICD-10-CM | POA: Diagnosis not present

## 2023-10-15 LAB — CBC
HCT: 42.5 % (ref 39.0–52.0)
Hemoglobin: 14.1 g/dL (ref 13.0–17.0)
MCH: 29.4 pg (ref 26.0–34.0)
MCHC: 33.2 g/dL (ref 30.0–36.0)
MCV: 88.5 fL (ref 80.0–100.0)
Platelets: 198 10*3/uL (ref 150–400)
RBC: 4.8 MIL/uL (ref 4.22–5.81)
RDW: 13.3 % (ref 11.5–15.5)
WBC: 9.4 10*3/uL (ref 4.0–10.5)
nRBC: 0 % (ref 0.0–0.2)

## 2023-10-15 LAB — BASIC METABOLIC PANEL
Anion gap: 8 (ref 5–15)
BUN: 30 mg/dL — ABNORMAL HIGH (ref 6–20)
CO2: 21 mmol/L — ABNORMAL LOW (ref 22–32)
Calcium: 8.6 mg/dL — ABNORMAL LOW (ref 8.9–10.3)
Chloride: 107 mmol/L (ref 98–111)
Creatinine, Ser: 3.11 mg/dL — ABNORMAL HIGH (ref 0.61–1.24)
GFR, Estimated: 25 mL/min — ABNORMAL LOW (ref >60)
Glucose, Bld: 102 mg/dL — ABNORMAL HIGH (ref 70–99)
Potassium: 4.3 mmol/L (ref 3.5–5.1)
Sodium: 136 mmol/L (ref 135–145)

## 2023-10-15 LAB — BRAIN NATRIURETIC PEPTIDE: B Natriuretic Peptide: 1864.1 pg/mL — ABNORMAL HIGH (ref 0.0–100.0)

## 2023-10-15 LAB — RESP PANEL BY RT-PCR (RSV, FLU A&B, COVID)  RVPGX2
Influenza A by PCR: NEGATIVE
Influenza B by PCR: NEGATIVE
Resp Syncytial Virus by PCR: NEGATIVE
SARS Coronavirus 2 by RT PCR: NEGATIVE

## 2023-10-15 NOTE — ED Triage Notes (Signed)
 Pt with URI symptoms x 1 week. Pt has been coughing up bloody sputum. No fevers.  Swelling noted to bilateral lower legs/ankles.  Dyspnea on exertion.

## 2023-10-15 NOTE — ED Provider Notes (Signed)
  EMERGENCY DEPARTMENT AT [facility] Provider Note   CSN: [id number] Arrival date & time: 10/15/23  2205     History  Chief Complaint  Patient presents with   Cough   Leg Swelling    [name] is a 38 y.o. male.  HPI     This is a 38 year old male with a history of HIV, ITP, chronic kidney disease who presents with concerns for dyspnea on exertion.  Patient reports that he has felt ill.  In general he has had decreased exercise tolerance with increased dyspnea on exertion.  Also reports he is had difficulty sleeping and lying flat.  Has noticed an increase in lower extremity swelling as well as weight gain.  Has not had any fever but has had a cough.  At times he notes blood streaked in his sputum.  Denies chest pain.  Reports no change in urine output but does report that his urine is foamy.  Not on any blood pressure medications or diuretics.  Home Medications Prior to Admission medications   Medication Sig Start Date End Date Taking? Authorizing Provider  cabotegravir  & rilpivirine  ER (CABENUVA ) 600 & 900 MG/3ML injection Inject 1 kit into the muscle every 2 (two) months. 05/06/23   Waddell Alan PARAS, RPH-CPP  fluticasone  (FLONASE ) 50 MCG/ACT nasal spray Place into both nostrils daily. Patient not taking: Reported on 12/03/2022    [provider]  methocarbamol  (ROBAXIN ) 500 MG tablet Take 1 tablet (500 mg total) by mouth 2 (two) times daily. Patient not taking: Reported on 12/03/2022 12/20/18   Rendell Lorane HERO, PA-C  oxymetazoline  (AFRIN) 0.05 % nasal spray Place 1 spray into both nostrils 2 (two) times daily. Patient not taking: Reported on 12/03/2022    [provider]      Allergies    Patient has no known allergies.    Review of Systems   Review of Systems  Constitutional:  Negative for fever.  Respiratory:  Positive for cough and shortness of breath.   Cardiovascular:  Positive for leg swelling. Negative for chest pain.  All  other systems reviewed and are negative.   Physical Exam Updated Vital Signs BP (!) 172/131   Pulse (!) 102   Temp (!) 97.5 F (36.4 C)   Resp (!) 21   SpO2 99%  Physical Exam Vitals and nursing note reviewed.  Constitutional:      Appearance: He is well-developed. He is not ill-appearing.  HENT:     Head: Normocephalic and atraumatic.  Eyes:     Pupils: Pupils are equal, round, and reactive to light.  Cardiovascular:     Rate and Rhythm: Regular rhythm. Tachycardia present.     Heart sounds: Normal heart sounds. No murmur heard. Pulmonary:     Effort: Pulmonary effort is normal. No respiratory distress.     Breath sounds: Rales present. No wheezing.  Abdominal:     General: Bowel sounds are normal.     Palpations: Abdomen is soft.     Tenderness: There is no abdominal tenderness. There is no rebound.     Comments: Anasarca noted  Musculoskeletal:     Cervical back: Neck supple.     Right lower leg: Edema present.     Left lower leg: Edema present.     Comments: 3+ pitting edema   Lymphadenopathy:     Cervical: No cervical adenopathy.  Skin:    General: Skin is warm and dry.  Neurological:     Mental Status:  He is alert and oriented to person, place, and time.  Psychiatric:        Mood and Affect: Mood normal.     ED Results / Procedures / Treatments   Labs (all labs ordered are listed, but only abnormal results are displayed) Labs Reviewed  BASIC METABOLIC PANEL - Abnormal; Notable for the following components:      Result Value   CO2 21 (*)    Glucose, Bld 102 (*)    BUN 30 (*)    Creatinine, Ser 3.11 (*)    Calcium 8.6 (*)    GFR, Estimated 25 (*)    All other components within normal limits  BRAIN NATRIURETIC PEPTIDE - Abnormal; Notable for the following components:   B Natriuretic Peptide 1,864.1 (*)    All other components within normal limits  RESP PANEL BY RT-PCR (RSV, FLU A&B, COVID)  RVPGX2  CBC    EKG None  Radiology DG Chest 2  View Result Date: 10/15/2023 CLINICAL DATA:  Cough EXAM: CHEST - 2 VIEW COMPARISON:  12/20/2018 FINDINGS: Cardiac shadow is prominent but accentuated by the frontal technique. Mild vascular congestion is noted with mild interstitial edema. No sizable effusion is seen. No focal infiltrate is noted. IMPRESSION: Changes consistent with CHF and mild edema. Electronically Signed   By: Oneil Devonshire M.D.   On: 10/15/2023 22:30    Procedures .Critical Care  Performed by: Bari Charmaine FALCON, MD Authorized by: Bari Charmaine FALCON, MD   Critical care provider statement:    Critical care time (minutes):  31   Critical care was necessary to treat or prevent imminent or life-threatening deterioration of the following conditions:  Renal failure (Pulmonary edema requiring diuresis)   Critical care was time spent personally by me on the following activities:  Development of treatment plan with patient or surrogate, discussions with consultants, evaluation of patient's response to treatment, examination of patient, ordering and review of laboratory studies, ordering and review of radiographic studies, ordering and performing treatments and interventions, pulse oximetry, re-evaluation of patient's condition and review of old charts     Medications Ordered in ED Medications  furosemide  (LASIX ) injection 40 mg (has no administration in time range)    ED Course/ Medical Decision Making/ A&P                                 Medical Decision Making Amount and/or Complexity of Data Reviewed Labs: ordered. Radiology: ordered.  Risk Prescription drug management. Decision regarding hospitalization.   This patient presents to the ED for concern of dyspnea on exertion, swelling, this involves an extensive number of treatment options, and is a complaint that carries with it a high risk of complications and morbidity.  I considered the following differential and admission for this acute, potentially life threatening  condition.  The differential diagnosis includes heart failure, worsening kidney function, pneumonia, pneumothorax, PE  MDM:    This is a 38 year old male who presents primarily with concern for worsening dyspnea on exertion.  Clinically he is volume overloaded with pitting edema and anasarca of the abdomen.  He is not in any respiratory distress and is satting 99% on room air while at rest.  Chest x-ray is concerning for pulmonary edema.  BNP is acutely elevated.  Also notably his creatinine has continued to trend upward.  Most recent baseline at 2.5.  Today his creatinine is 3.11.  He has never seen a  nephrologist.  Currently he is only followed by infectious disease.  He will need some diuresis but given his kidney function and concern for worsening kidney function potentially with diuresis, feel he warrants admission.  He also likely need some blood pressure control with blood pressure 172/131.  He does not take any medications other than his HIV injections.  (Labs, imaging, consults)  Labs: I Ordered, and personally interpreted labs.  The pertinent results include: CBC, BMP, BNP  Imaging Studies ordered: I ordered imaging studies including chest x-ray I independently visualized and interpreted imaging. I agree with the radiologist interpretation  Additional history obtained from chart review.  External records from outside source obtained and reviewed including infectious disease notes  Cardiac Monitoring: The patient was maintained on a cardiac monitor.  If on the cardiac monitor, I personally viewed and interpreted the cardiac monitored which showed an underlying rhythm of: Sinus tachycardia  Reevaluation: After the interventions noted above, I reevaluated the patient and found that they have :stayed the same  Social Determinants of Health:  lives independently  Disposition: Admit  Co morbidities that complicate the patient evaluation  Past Medical History:  Diagnosis Date    HIV (human immunodeficiency virus infection) (HCC)    ITP (idiopathic thrombocytopenic purpura) 11/24/2013     Medicines Meds ordered this encounter  Medications   furosemide  (LASIX ) injection 40 mg    I have reviewed the patients home medicines and have made adjustments as needed  Problem List / ED Course: Problem List Items Addressed This Visit   None Visit Diagnoses       AKI (acute kidney injury) (HCC)    -  Primary     Acute pulmonary edema (HCC)                       Final Clinical Impression(s) / ED Diagnoses Final diagnoses:  AKI (acute kidney injury) (HCC)  Acute pulmonary edema (HCC)    Rx / DC Orders ED Discharge Orders     None         Shaneika Rossa, Charmaine FALCON, MD 10/16/23 [phone]

## 2023-10-16 ENCOUNTER — Inpatient Hospital Stay (HOSPITAL_COMMUNITY): Payer: BC Managed Care – PPO

## 2023-10-16 ENCOUNTER — Telehealth (HOSPITAL_COMMUNITY): Payer: Self-pay

## 2023-10-16 ENCOUNTER — Other Ambulatory Visit (HOSPITAL_COMMUNITY): Payer: Self-pay

## 2023-10-16 DIAGNOSIS — R6510 Systemic inflammatory response syndrome (SIRS) of non-infectious origin without acute organ dysfunction: Secondary | ICD-10-CM | POA: Insufficient documentation

## 2023-10-16 DIAGNOSIS — N1832 Chronic kidney disease, stage 3b: Secondary | ICD-10-CM

## 2023-10-16 DIAGNOSIS — I130 Hypertensive heart and chronic kidney disease with heart failure and stage 1 through stage 4 chronic kidney disease, or unspecified chronic kidney disease: Secondary | ICD-10-CM | POA: Diagnosis present

## 2023-10-16 DIAGNOSIS — I161 Hypertensive emergency: Secondary | ICD-10-CM

## 2023-10-16 DIAGNOSIS — D72819 Decreased white blood cell count, unspecified: Secondary | ICD-10-CM | POA: Diagnosis present

## 2023-10-16 DIAGNOSIS — I5041 Acute combined systolic (congestive) and diastolic (congestive) heart failure: Secondary | ICD-10-CM | POA: Diagnosis not present

## 2023-10-16 DIAGNOSIS — J810 Acute pulmonary edema: Secondary | ICD-10-CM | POA: Diagnosis not present

## 2023-10-16 DIAGNOSIS — Z8619 Personal history of other infectious and parasitic diseases: Secondary | ICD-10-CM | POA: Diagnosis not present

## 2023-10-16 DIAGNOSIS — R059 Cough, unspecified: Secondary | ICD-10-CM

## 2023-10-16 DIAGNOSIS — B20 Human immunodeficiency virus [HIV] disease: Secondary | ICD-10-CM | POA: Diagnosis not present

## 2023-10-16 DIAGNOSIS — R601 Generalized edema: Secondary | ICD-10-CM | POA: Diagnosis present

## 2023-10-16 DIAGNOSIS — I5021 Acute systolic (congestive) heart failure: Secondary | ICD-10-CM | POA: Diagnosis present

## 2023-10-16 DIAGNOSIS — I361 Nonrheumatic tricuspid (valve) insufficiency: Secondary | ICD-10-CM | POA: Diagnosis not present

## 2023-10-16 DIAGNOSIS — G479 Sleep disorder, unspecified: Secondary | ICD-10-CM | POA: Diagnosis present

## 2023-10-16 DIAGNOSIS — Z8249 Family history of ischemic heart disease and other diseases of the circulatory system: Secondary | ICD-10-CM | POA: Diagnosis not present

## 2023-10-16 DIAGNOSIS — I5031 Acute diastolic (congestive) heart failure: Secondary | ICD-10-CM

## 2023-10-16 DIAGNOSIS — R000 Tachycardia, unspecified: Secondary | ICD-10-CM | POA: Diagnosis present

## 2023-10-16 DIAGNOSIS — F17290 Nicotine dependence, other tobacco product, uncomplicated: Secondary | ICD-10-CM | POA: Diagnosis present

## 2023-10-16 DIAGNOSIS — N184 Chronic kidney disease, stage 4 (severe): Secondary | ICD-10-CM | POA: Diagnosis present

## 2023-10-16 DIAGNOSIS — Z21 Asymptomatic human immunodeficiency virus [HIV] infection status: Secondary | ICD-10-CM | POA: Diagnosis present

## 2023-10-16 DIAGNOSIS — Z6832 Body mass index (BMI) 32.0-32.9, adult: Secondary | ICD-10-CM | POA: Diagnosis not present

## 2023-10-16 DIAGNOSIS — I43 Cardiomyopathy in diseases classified elsewhere: Secondary | ICD-10-CM | POA: Diagnosis present

## 2023-10-16 DIAGNOSIS — I340 Nonrheumatic mitral (valve) insufficiency: Secondary | ICD-10-CM | POA: Diagnosis not present

## 2023-10-16 DIAGNOSIS — I3139 Other pericardial effusion (noninflammatory): Secondary | ICD-10-CM | POA: Diagnosis not present

## 2023-10-16 DIAGNOSIS — N179 Acute kidney failure, unspecified: Secondary | ICD-10-CM | POA: Diagnosis present

## 2023-10-16 DIAGNOSIS — F32A Depression, unspecified: Secondary | ICD-10-CM | POA: Diagnosis present

## 2023-10-16 DIAGNOSIS — Z79899 Other long term (current) drug therapy: Secondary | ICD-10-CM | POA: Diagnosis not present

## 2023-10-16 DIAGNOSIS — R7989 Other specified abnormal findings of blood chemistry: Secondary | ICD-10-CM | POA: Diagnosis present

## 2023-10-16 DIAGNOSIS — I509 Heart failure, unspecified: Secondary | ICD-10-CM | POA: Diagnosis not present

## 2023-10-16 DIAGNOSIS — E66811 Obesity, class 1: Secondary | ICD-10-CM | POA: Diagnosis present

## 2023-10-16 DIAGNOSIS — R809 Proteinuria, unspecified: Secondary | ICD-10-CM | POA: Diagnosis present

## 2023-10-16 DIAGNOSIS — Q211 Atrial septal defect, unspecified: Secondary | ICD-10-CM | POA: Diagnosis not present

## 2023-10-16 DIAGNOSIS — Z1152 Encounter for screening for COVID-19: Secondary | ICD-10-CM | POA: Diagnosis not present

## 2023-10-16 DIAGNOSIS — Z841 Family history of disorders of kidney and ureter: Secondary | ICD-10-CM | POA: Diagnosis not present

## 2023-10-16 LAB — BASIC METABOLIC PANEL
Anion gap: 9 (ref 5–15)
BUN: 28 mg/dL — ABNORMAL HIGH (ref 6–20)
CO2: 23 mmol/L (ref 22–32)
Calcium: 8.5 mg/dL — ABNORMAL LOW (ref 8.9–10.3)
Chloride: 107 mmol/L (ref 98–111)
Creatinine, Ser: 3.11 mg/dL — ABNORMAL HIGH (ref 0.61–1.24)
GFR, Estimated: 25 mL/min — ABNORMAL LOW (ref >60)
Glucose, Bld: 116 mg/dL — ABNORMAL HIGH (ref 70–99)
Potassium: 3.8 mmol/L (ref 3.5–5.1)
Sodium: 139 mmol/L (ref 135–145)

## 2023-10-16 LAB — URINALYSIS, COMPLETE (UACMP) WITH MICROSCOPIC
Bacteria, UA: NONE SEEN
Bilirubin Urine: NEGATIVE
Glucose, UA: NEGATIVE mg/dL
Ketones, ur: NEGATIVE mg/dL
Leukocytes,Ua: NEGATIVE
Nitrite: NEGATIVE
Protein, ur: 100 mg/dL — AB
Specific Gravity, Urine: 1.004 — ABNORMAL LOW (ref 1.005–1.030)
pH: 6 (ref 5.0–8.0)

## 2023-10-16 LAB — TSH: TSH: 1.384 u[IU]/mL (ref 0.350–4.500)

## 2023-10-16 LAB — HEPATIC FUNCTION PANEL
ALT: 40 U/L (ref 0–44)
AST: 50 U/L — ABNORMAL HIGH (ref 15–41)
Albumin: 2.5 g/dL — ABNORMAL LOW (ref 3.5–5.0)
Alkaline Phosphatase: 94 U/L (ref 38–126)
Bilirubin, Direct: 0.2 mg/dL (ref 0.0–0.2)
Indirect Bilirubin: 1.1 mg/dL — ABNORMAL HIGH (ref 0.3–0.9)
Total Bilirubin: 1.3 mg/dL — ABNORMAL HIGH (ref 0.0–1.2)
Total Protein: 5.8 g/dL — ABNORMAL LOW (ref 6.5–8.1)

## 2023-10-16 LAB — TROPONIN I (HIGH SENSITIVITY)
Troponin I (High Sensitivity): 78 ng/L — ABNORMAL HIGH (ref <18)
Troponin I (High Sensitivity): 81 ng/L — ABNORMAL HIGH (ref <18)

## 2023-10-16 LAB — ECHOCARDIOGRAM COMPLETE
Area-P 1/2: 4.29 cm2
Calc EF: 18.5 %
Est EF: <20
S' Lateral: 5.7 cm
Single Plane A2C EF: 21.9 %
Single Plane A4C EF: 16.7 %

## 2023-10-16 LAB — PROCALCITONIN: Procalcitonin: <0.1 ng/mL

## 2023-10-16 MED ORDER — GUAIFENESIN 100 MG/5ML PO LIQD: 5.0000 mL | ORAL | Status: DC | PRN

## 2023-10-16 MED ORDER — POTASSIUM CHLORIDE CRYS ER 20 MEQ PO TBCR: 40.0000 meq | EXTENDED_RELEASE_TABLET | Freq: Once | ORAL | Status: DC

## 2023-10-16 MED ORDER — POTASSIUM CHLORIDE CRYS ER 20 MEQ PO TBCR
40.0000 meq | EXTENDED_RELEASE_TABLET | ORAL | Status: AC
  Administered 2023-10-16: 40 meq via ORAL
  Filled 2023-10-16: qty 2

## 2023-10-16 MED ORDER — ONDANSETRON HCL 4 MG PO TABS: 4.0000 mg | ORAL_TABLET | Freq: Four times a day (QID) | ORAL | Status: DC | PRN

## 2023-10-16 MED ORDER — ISOSORB DINITRATE-HYDRALAZINE 20-37.5 MG PO TABS
2.0000 | ORAL_TABLET | Freq: Three times a day (TID) | ORAL | Status: DC
  Administered 2023-10-16 – 2023-10-18 (×7): 2 via ORAL
  Filled 2023-10-16 (×7): qty 2

## 2023-10-16 MED ORDER — HEPARIN SODIUM (PORCINE) 5000 UNIT/ML IJ SOLN
5000.0000 [IU] | Freq: Three times a day (TID) | INTRAMUSCULAR | Status: DC
  Administered 2023-10-16 – 2023-10-20 (×12): 5000 [IU] via SUBCUTANEOUS
  Filled 2023-10-16 (×12): qty 1

## 2023-10-16 MED ORDER — ISOSORBIDE MONONITRATE ER 30 MG PO TB24
30.0000 mg | ORAL_TABLET | Freq: Every day | ORAL | Status: DC
  Administered 2023-10-16: 30 mg via ORAL
  Filled 2023-10-16: qty 1

## 2023-10-16 MED ORDER — ACETAMINOPHEN 650 MG RE SUPP
650.0000 mg | Freq: Four times a day (QID) | RECTAL | Status: DC | PRN
Start: 2023-10-16 — End: 2023-10-20

## 2023-10-16 MED ORDER — NITROGLYCERIN 2 % TD OINT
0.5000 [in_us] | TOPICAL_OINTMENT | Freq: Once | TRANSDERMAL | Status: AC
  Administered 2023-10-16: 0.5 [in_us] via TOPICAL
  Filled 2023-10-16: qty 1

## 2023-10-16 MED ORDER — ONDANSETRON HCL 4 MG/2ML IJ SOLN: 4.0000 mg | Freq: Four times a day (QID) | INTRAMUSCULAR | Status: DC | PRN

## 2023-10-16 MED ORDER — FUROSEMIDE 10 MG/ML IJ SOLN
40.0000 mg | Freq: Once | INTRAMUSCULAR | Status: AC
Start: 2023-10-16 — End: 2023-10-16
  Administered 2023-10-16: 40 mg via INTRAVENOUS
  Filled 2023-10-16: qty 4

## 2023-10-16 MED ORDER — HYDRALAZINE HCL 50 MG PO TABS
50.0000 mg | ORAL_TABLET | Freq: Three times a day (TID) | ORAL | Status: DC
  Administered 2023-10-16: 50 mg via ORAL
  Filled 2023-10-16: qty 2

## 2023-10-16 MED ORDER — ACETAMINOPHEN 325 MG PO TABS
650.0000 mg | ORAL_TABLET | Freq: Four times a day (QID) | ORAL | Status: DC | PRN
  Administered 2023-10-16 (×2): 650 mg via ORAL
  Filled 2023-10-16 (×2): qty 2

## 2023-10-16 MED ORDER — PERFLUTREN LIPID MICROSPHERE
1.0000 mL | INTRAVENOUS | Status: AC | PRN
  Administered 2023-10-16: 4 mL via INTRAVENOUS

## 2023-10-16 MED ORDER — FUROSEMIDE 10 MG/ML IJ SOLN
40.0000 mg | Freq: Two times a day (BID) | INTRAMUSCULAR | Status: DC
Start: 2023-10-16 — End: 2023-10-16
  Administered 2023-10-16: 40 mg via INTRAVENOUS
  Filled 2023-10-16: qty 4

## 2023-10-16 MED ORDER — FUROSEMIDE 10 MG/ML IJ SOLN
80.0000 mg | Freq: Two times a day (BID) | INTRAMUSCULAR | Status: AC
  Administered 2023-10-16 – 2023-10-18 (×5): 80 mg via INTRAVENOUS
  Filled 2023-10-16 (×5): qty 8

## 2023-10-16 NOTE — Consult Note (Signed)
 [facility] Renal Consultation Note  Requesting MD: Ivonne Mustache, MD Indication for Consultation:  CKD  Chief complaint: shortness of breath and leg swelling   HPI:  [name] is a 38 y.o. male with a history of HIV, CKD, and ITP who presented to the hospital with a one week history of progressive shortness of breath with exertion and lower extremity swelling that had been worsening over the past couple of weeks.  He had noted foamy urine for a month.  He was found to have new heart failure with significant volume overload.  Baseline creatinine is between 2-1/2 and 3 he has no nephrologist and reported he was not aware of his kidney disease.  Nephrology is consulted for assistance with management of CKD in the setting of overload.   Here he was given lasix  40 mg IV once initially and this was increased to lasix  80 mg IV BID.  He had a TTE with LVEF less than 20% and grade II diastolic dysfunction.  There was mildly elevated pulmonary artery systolic pressure.  He was noted to have a small ASD as well with predominantly left to right shunting across the atrial septum.  Heart failure team is seeing him.  He had 3.9 liters UOP over 1/9 and has had 2.0 liters UOP charted thus far over 1/10.  Per charting he was referred to establish care with PCP but never did.  He is a little discouraged by everything going on.  He asked if the high blood pressure is from what he is eating.  We discussed diet would contribute but that genetics playing a factor, too.  His dad and his grandmother had HTN and his dad has CKD and states takes medication for the same.  He has had HIV for over 10 years.  Previously was on biktarvy  and states he was changed (he thinks was in part due to CKD).  He states his ID MD has retired so he has a new provider.  He was seen most recently in 04/08/23 by Annalee Orem, MD in ID. His viral loads that I have located are undetectable and have been for some time.  He confirms  HIV is well-controlled.   Creatinine, Ser  Date/Time Value Ref Range Status  10/16/2023 08:40 AM 3.11 (H) 0.61 - 1.24 mg/dL Final  98/90/7974 89:78 PM 3.11 (H) 0.61 - 1.24 mg/dL Final  93/72/7975 90:66 AM 2.90 (H) 0.61 - 1.24 mg/dL Final  93/72/7975 90:76 AM 2.62 (H) 0.61 - 1.24 mg/dL Final   Creat  Date/Time Value Ref Range Status  04/08/2023 10:40 AM 2.50 (H) 0.60 - 1.26 mg/dL Final  97/71/7975 88:67 AM 1.83 (H) 0.60 - 1.26 mg/dL Final  90/77/7977 89:76 AM 1.54 (H) 0.60 - 1.26 mg/dL Final  93/78/7978 90:93 AM 1.89 (H) 0.60 - 1.35 mg/dL Final  93/74/7979 87:91 PM 1.59 (H) 0.60 - 1.35 mg/dL Final  88/72/7980 90:74 AM 1.53 (H) 0.60 - 1.35 mg/dL Final  91/70/7980 89:72 AM 1.59 (H) 0.60 - 1.35 mg/dL Final  92/81/7981 96:92 PM 1.44 (H) 0.60 - 1.35 mg/dL Final  96/91/7982 95:98 PM 1.29 0.60 - 1.35 mg/dL Final  95/92/7983 88:98 AM 0.88 0.50 - 1.35 mg/dL Final  87/84/7984 90:92 AM 1.04 0.50 - 1.35 mg/dL Final  90/77/7984 90:75 AM 1.07 0.50 - 1.35 mg/dL Final   PMHx:   Past Medical History:  Diagnosis Date   HIV (human immunodeficiency virus infection) (HCC)    ITP (idiopathic thrombocytopenic purpura) 11/24/2013    Past Surgical History:  Procedure Laterality Date   ADENOIDECTOMY N/A 11/16/2013   Procedure: ADENOIDECTOMY;  Surgeon: Marlyce Finer, MD;  Location[facility] OR;  Service: ENT;  Laterality: N/A;   MYRINGOTOMY WITH TUBE PLACEMENT Bilateral 11/16/2013   Procedure: MYRINGOTOMY WITH TUBE PLACEMENT;  Surgeon: Marlyce Finer, MD;  Location: [facility] OR;  Service: ENT;  Laterality: Bilateral;   NASAL ENDOSCOPY WITH EPISTAXIS CONTROL N/A 11/16/2013   Procedure: NASAL ENDOSCOPY with nasopharyngeal biopsy with frozen section and adnoidectomy;  Surgeon: Marlyce Finer, MD;  Location: [facility] OR;  Service: ENT;  Laterality: N/A;    Family Hx:  His father and paternal grandmother have a history of HTN.  His father has a history of CKD  Social History:  reports that he quit smoking about 9 years ago.  His smoking use included cigarettes. He started smoking about 21 years ago. He has a 1.2 pack-year smoking history. He has never used smokeless tobacco. He reports that he does not drink alcohol and does not use drugs.  Allergies: No Known Allergies  Medications: Prior to Admission medications   Medication Sig Start Date End Date Taking? Authorizing Provider  acetaminophen  (TYLENOL ) 500 MG tablet Take 1,000 mg by mouth every 6 (six) hours as needed for mild pain (pain score 1-3), fever or headache.   Yes [provider]  cabotegravir  & rilpivirine  ER (CABENUVA ) 600 & 900 MG/3ML injection Inject 1 kit into the muscle every 2 (two) months. 05/06/23  Yes Waddell Alan PARAS, RPH-CPP    I have reviewed the patient's current and reported prior to admission medications.  Labs:     Latest Ref Rng & Units 10/16/2023    8:40 AM 10/15/2023   10:21 PM 04/08/2023   10:40 AM  BMP  Glucose 70 - 99 mg/dL 883  897  92   BUN 6 - 20 mg/dL 28  30  20    Creatinine 0.61 - 1.24 mg/dL 6.88  6.88  7.49   BUN/Creat Ratio 6 - 22 (calc)   8   Sodium 135 - 145 mmol/L 139  136  140   Potassium 3.5 - 5.1 mmol/L 3.8  4.3  3.9   Chloride 98 - 111 mmol/L 107  107  106   CO2 22 - 32 mmol/L 23  21  26    Calcium 8.9 - 10.3 mg/dL 8.5  8.6  8.9     Urinalysis    Component Value Date/Time   COLORURINE COLORLESS (A) 10/16/2023 0210   APPEARANCEUR CLEAR 10/16/2023 0210   LABSPEC 1.004 (L) 10/16/2023 0210   PHURINE 6.0 10/16/2023 0210   GLUCOSEU NEGATIVE 10/16/2023 0210   HGBUR SMALL (A) 10/16/2023 0210   BILIRUBINUR NEGATIVE 10/16/2023 0210   KETONESUR NEGATIVE 10/16/2023 0210   PROTEINUR 100 (A) 10/16/2023 0210   NITRITE NEGATIVE 10/16/2023 0210   LEUKOCYTESUR NEGATIVE 10/16/2023 0210     ROS:  Pertinent items noted in HPI and remainder of comprehensive ROS otherwise negative.  Physical Exam: Vitals:   10/16/23 1502 10/16/23 2103  BP: (!) 142/112 (!) 148/115  Pulse: 100 (!) 105  Resp: 18   Temp: 98.8  F (37.1 C)   SpO2: 94% 94%      General: adult male in bed in NAD  HEENT: NCAT Eyes: EOMI sclera anicteric Neck: supple trachea midline  Heart: S1S2 no rub Lungs: clear to auscultation normal work of breathing at rest and occ increased work of breathing with exertion Abdomen: soft/nt/nd Extremities: 2+ edema bilateral lower extremities Skin: no rash on extremities exposed  Neuro:  alert and oriented x 3 provides hx and follows commands  Psych normal mood and affect  Assessment/Plan:  # Acute systolic congestive heart failure, severe - new diagnosis  - Per heart failure team is in the setting of uncontrolled HTN.   - Appreciate CHF team - Continue lasix  80 mg IV BID - excellent response to diuretics  # CKD IV - Likely secondary to HTN.  His HIV is well-controlled so less of a factor.  He was previously on biktarvy  and was changed in part due to renal function.    - Baseline Cr 2.5 - 3.0 - Agree with diuretics - Check up/cr ratio   - Given the proteinuria will check ANA, ANCA, C3, C4 and anti-GBM.  Less likely etiology due to the chronicity - has worsened over several years - I did discuss with him that he has no indication for dialysis acutely and that our goal would be to protect his residual renal function as well as to refer for pre-emptive kidney transplant when that was indicated if heart function improved to allow that - Check intact PTH and phos in AM - Will need to avoid NSAID's  # HTN emergency  - Diuretics as above  - He has been initiated on therapies per primary team and CHF - he has nitroglycerin  paste on that appears to be coming off - I have reached out to nursing about this   # HIV - Therapies per Infectious Disease.  Appreciate ID and ID pharmacy   Thank you for the consult.  Please do not hesitate to contact me with any questions regarding our patient.     Katheryn JAYSON Saba 10/16/2023, 9:04 PM

## 2023-10-16 NOTE — H&P (View-Only) (Signed)
 Advanced Heart Failure Team Consult Note   Primary Physician: Patient, No Pcp Per Cardiologist:  None  Reason for Consultation: Acute HFrEF  HPI:    [name] is seen today for evaluation of heart failure at the request of  Dr Jillian.   [name] is a 38 year old with a history of HIV on cabenuva  and CKD Stage IIIb. Followed by ID. Has not been seen by PCP/Nephrology. No previous cardiac history. He does not drink alcohol or smoke. His father has kidney disease and HTN. No family history of SCD.   2 weeks ago he started noticing lower extremity swelling. Last week developed a cough and took OTC with no benefit. Progressive dyspnea with exertion and orthopnea. He has had a hard time sleeping. Works remotely for [employer].   Presented to ED with leg edema. Hypertensive on arrival 172/131. SABRACXR with pulmonary edema. BNP 1864, creatinine 3.1, CO2 21, and HS trop 78>81. Started on 40 mg IV lasix , hydralazine , and nitropaste. Echo EF severely reduced LVEF < 20% global HK and RV mildly reduced.   Renal US  - no hydronephrosis. Increased renal echogenicity. Brisk diuresis noted.   Home Medications Prior to Admission medications   Medication Sig Start Date End Date Taking? Authorizing Provider  acetaminophen  (TYLENOL ) 500 MG tablet Take 1,000 mg by mouth every 6 (six) hours as needed for mild pain (pain score 1-3), fever or headache.   Yes [provider]  cabotegravir  & rilpivirine  ER (CABENUVA ) 600 & 900 MG/3ML injection Inject 1 kit into the muscle every 2 (two) months. 05/06/23  Yes Waddell Alan PARAS, RPH-CPP    Past Medical History: Past Medical History:  Diagnosis Date   HIV (human immunodeficiency virus infection) (HCC)    ITP (idiopathic thrombocytopenic purpura) 11/24/2013    Past Surgical History: Past Surgical History:  Procedure Laterality Date   ADENOIDECTOMY N/A 11/16/2013   Procedure: ADENOIDECTOMY;  Surgeon: Marlyce Finer, MD;  Location: [facility] OR;   Service: ENT;  Laterality: N/A;   MYRINGOTOMY WITH TUBE PLACEMENT Bilateral 11/16/2013   Procedure: MYRINGOTOMY WITH TUBE PLACEMENT;  Surgeon: Marlyce Finer, MD;  Location: [facility] OR;  Service: ENT;  Laterality: Bilateral;   NASAL ENDOSCOPY WITH EPISTAXIS CONTROL N/A 11/16/2013   Procedure: NASAL ENDOSCOPY with nasopharyngeal biopsy with frozen section and adnoidectomy;  Surgeon: Marlyce Finer, MD;  Location: [facility] OR;  Service: ENT;  Laterality: N/A;    Family History: Family History  Problem Relation Age of Onset   Hypertension Neg Hx    Diabetes Neg Hx     Social History: Social History   Socioeconomic History   Marital status: Single    Spouse name: Not on file   Number of children: Not on file   Years of education: Not on file   Highest education level: Not on file  Occupational History   Not on file  Tobacco Use   Smoking status: Former    Current packs/day: 0.00    Average packs/day: 0.1 packs/day for 12.5 years (1.2 ttl pk-yrs)    Types: Cigarettes    Start date: 12/30/2001    Quit date: 06/26/2014    Years since quitting: 9.3   Smokeless tobacco: Never   Tobacco comments:    pt states he wants to quit by his birthday in July  Vaping Use   Vaping status: Every Day  Substance and Sexual Activity   Alcohol use: No   Drug use: No   Sexual activity: Not Currently  Partners: Female, Male    Birth control/protection: Condom    Comment: declined condoms  Other Topics Concern   Not on file  Social History Narrative   Not on file   Social Drivers of Health   Financial Resource Strain: Not on file  Food Insecurity: Not on file  Transportation Needs: Not on file  Physical Activity: Not on file  Stress: Not on file  Social Connections: Not on file    Allergies:  No Known Allergies  Objective:    Vital Signs:   Temp:  [97.5 F (36.4 C)-98.7 F (37.1 C)] 97.6 F (36.4 C) (01/10 0702) Pulse Rate:  [90-115] 101 (01/10 0702) Resp:  [13-30] 22 (01/10 0702) BP:  (159-190)/(122-156) 159/122 (01/10 0702) SpO2:  [89 %-100 %] 99 % (01/10 0934)    Weight change: There were no vitals filed for this visit.  Intake/Output:   Intake/Output Summary (Last 24 hours) at 10/16/2023 1059 Last data filed at 10/16/2023 0938 Gross per 24 hour  Intake --  Output 4275 ml  Net -4275 ml      Physical Exam    General:  No resp difficulty HEENT: normal Neck: supple. JVP to jaw  Carotids 2+ bilat; no bruits. No lymphadenopathy or thyromegaly appreciated. Cor: PMI nondisplaced. Regular rate & rhythm. No rubs, murmurs. + S3  Lungs: clear Abdomen: soft, nontender, nondistended. No hepatosplenomegaly. No bruits or masses. Good bowel sounds. Extremities: warm, no cyanosis, clubbing, rash, R and LLE 3+ edema Neuro: alert & orientedx3, cranial nerves grossly intact. moves all 4 extremities w/o difficulty. Affect pleasant   Telemetry   ST 100s   EKG    ST 104 narrow QRS   Labs   Basic Metabolic Panel: Recent Labs  Lab 10/15/23 2221 10/16/23 0840  NA 136 139  K 4.3 3.8  CL 107 107  CO2 21* 23  GLUCOSE 102* 116*  BUN 30* 28*  CREATININE 3.11* 3.11*  CALCIUM 8.6* 8.5*    Liver Function Tests: Recent Labs  Lab 10/16/23 0210  AST 50*  ALT 40  ALKPHOS 94  BILITOT 1.3*  PROT 5.8*  ALBUMIN  2.5*   No results for input(s): LIPASE, AMYLASE in the last 168 hours. No results for input(s): AMMONIA in the last 168 hours.  CBC: Recent Labs  Lab 10/15/23 2221  WBC 9.4  HGB 14.1  HCT 42.5  MCV 88.5  PLT 198    Cardiac Enzymes: No results for input(s): CKTOTAL, CKMB, CKMBINDEX, TROPONINI in the last 168 hours.  BNP: BNP (last 3 results) Recent Labs    10/15/23 2221  BNP 1,864.1*    ProBNP (last 3 results) No results for input(s): PROBNP in the last 8760 hours.   CBG: No results for input(s): GLUCAP in the last 168 hours.  Coagulation Studies: No results for input(s): LABPROT, INR in the last 72  hours.   Imaging   US  RENAL Result Date: 10/16/2023 CLINICAL DATA:  Acute renal failure EXAM: RENAL / URINARY TRACT ULTRASOUND COMPLETE COMPARISON:  None Available. FINDINGS: Right Kidney: Renal measurements: 10.1 x 3.7 x 5.5 cm = volume: 107.6 mL. Increased echogenicity. No mass or hydronephrosis visualized. Left Kidney: Renal measurements: 9.7 x 4.3 x 4.1 cm = volume: 90.7 mL. Increased echogenicity. No mass or hydronephrosis visualized. Bladder: Appears normal for degree of bladder distention. Other: None. IMPRESSION: 1. No hydronephrosis. 2. Increased renal echogenicity as can be seen with chronic medical renal disease. Electronically Signed   By: Bard Moats M.D.   On: 10/16/2023  09:20   ECHOCARDIOGRAM COMPLETE Result Date: 10/16/2023    ECHOCARDIOGRAM REPORT   Patient Name:   [name] Date of Exam: 10/16/2023 Medical Rec #:  [id number]     Height:       73.0 in Accession #:    [id number]    Weight:       223.0 lb Date of Birth:  27-Nov-1985     BSA:          2.254 m Patient Age:    37 years      BP:           164/123 mmHg Patient Gender: M             HR:           97 bpm. Exam Location:  Inpatient Procedure: 2D Echo, Color Doppler, Cardiac Doppler and Intracardiac            Opacification Agent Indications:    I50.31 Acute diastolic (congestive) heart failure  History:        Patient has no prior history of Echocardiogram examinations.  Sonographer:    Damien Senior RDCS Referring Phys: [id number] DELAYNE LULLA SOLIAN IMPRESSIONS  1. Prominent LV trabeculations without evidence of thrombus with contrast. Left ventricular ejection fraction, by estimation, is <20%. Left ventricular ejection fraction by 2D MOD biplane is 18.5 %. The left ventricle has severely decreased function. The left ventricle demonstrates global hypokinesis. The left ventricular internal cavity size was moderately dilated. There is mild concentric left ventricular hypertrophy. Left ventricular diastolic parameters are consistent with  Grade II diastolic dysfunction (pseudonormalization).  2. Right ventricular systolic function is mildly reduced. The right ventricular size is mildly enlarged. There is mildly elevated pulmonary artery systolic pressure. The estimated right ventricular systolic pressure is 42.9 mmHg.  3. The mitral valve is normal in structure. Mild mitral valve regurgitation. No evidence of mitral stenosis.  4. The aortic valve is tricuspid. Aortic valve regurgitation is not visualized. No aortic stenosis is present.  5. There is mild dilatation of the ascending aorta, measuring 41 mm.  6. The inferior vena cava is dilated in size with <50% respiratory variability, suggesting right atrial pressure of 15 mmHg.  7. Evidence of atrial level shunting detected by color flow Doppler. There is a small atrial septal defect with predominantly left to right shunting across the atrial septum. FINDINGS  Left Ventricle: Prominent LV trabeculations without evidence of thrombus with contrast. Left ventricular ejection fraction, by estimation, is <20%. Left ventricular ejection fraction by 2D MOD biplane is 18.5 %. The left ventricle has severely decreased  function. The left ventricle demonstrates global hypokinesis. Definity  contrast agent was given IV to delineate the left ventricular endocardial borders. The left ventricular internal cavity size was moderately dilated. There is mild concentric left ventricular hypertrophy. Left ventricular diastolic parameters are consistent with Grade II diastolic dysfunction (pseudonormalization). Right Ventricle: The right ventricular size is mildly enlarged. No increase in right ventricular wall thickness. Right ventricular systolic function is mildly reduced. There is mildly elevated pulmonary artery systolic pressure. The tricuspid regurgitant  velocity is 2.64 m/s, and with an assumed right atrial pressure of 15 mmHg, the estimated right ventricular systolic pressure is 42.9 mmHg. Left Atrium: Left  atrial size was normal in size. Right Atrium: Right atrial size was normal in size. Pericardium: There is no evidence of pericardial effusion. Mitral Valve: The mitral valve is normal in structure. Mild mitral valve regurgitation. No evidence of mitral valve stenosis. Tricuspid Valve:  The tricuspid valve is normal in structure. Tricuspid valve regurgitation is mild. Aortic Valve: The aortic valve is tricuspid. Aortic valve regurgitation is not visualized. No aortic stenosis is present. Pulmonic Valve: The pulmonic valve was normal in structure. Pulmonic valve regurgitation is trivial. Aorta: The aortic root is normal in size and structure. There is mild dilatation of the ascending aorta, measuring 41 mm. Venous: The inferior vena cava is dilated in size with less than 50% respiratory variability, suggesting right atrial pressure of 15 mmHg. IAS/Shunts: Evidence of atrial level shunting detected by color flow Doppler. There is a small atrial septal defect with predominantly left to right shunting across the atrial septum.  LEFT VENTRICLE PLAX 2D                        Biplane EF (MOD) LVIDd:         6.50 cm         LV Biplane EF:   Left LVIDs:         5.70 cm                          ventricular LV PW:         1.10 cm                          ejection LV IVS:        1.10 cm                          fraction by LVOT diam:     2.10 cm                          2D MOD LV SV:         50                               biplane is LV SV Index:   22                               18.5 %. LVOT Area:     3.46 cm                                Diastology                                LV e' medial:    9.36 cm/s LV Volumes (MOD)               LV E/e' medial:  10.3 LV vol d, MOD    242.0 ml      LV e' lateral:   8.16 cm/s A2C:                           LV E/e' lateral: 11.8 LV vol d, MOD    204.0 ml A4C: LV vol s, MOD    189.0 ml A2C: LV vol s, MOD    170.0 ml A4C: LV SV MOD A2C:   53.0 ml LV SV MOD A4C:   204.0 ml LV  SV MOD BP:     40.9 ml RIGHT VENTRICLE RV S prime:     10.40 cm/s TAPSE (M-mode): 2.5 cm LEFT ATRIUM              Index        RIGHT ATRIUM           Index LA diam:        4.10 cm  1.82 cm/m   RA Area:     20.10 cm LA Vol (A2C):   154.0 ml 68.34 ml/m  RA Volume:   63.00 ml  27.96 ml/m LA Vol (A4C):   89.8 ml  39.85 ml/m LA Biplane Vol: 119.0 ml 52.81 ml/m  AORTIC VALVE LVOT Vmax:   90.20 cm/s LVOT Vmean:  69.300 cm/s LVOT VTI:    0.145 m  AORTA Ao Root diam: 3.70 cm Ao Asc diam:  4.10 cm MITRAL VALVE               TRICUSPID VALVE MV Area (PHT): 4.29 cm    TR Peak grad:   27.9 mmHg MV Decel Time: 177 msec    TR Vmax:        264.00 cm/s MV E velocity: 96.40 cm/s MV A velocity: 29.90 cm/s  SHUNTS MV E/A ratio:  3.22        Systemic VTI:  0.14 m                            Systemic Diam: 2.10 cm Dalton McleanMD Electronically signed by Ezra Kanner Signature Date/Time: 10/16/2023/8:58:07 AM    Final    DG Chest 2 View Result Date: 10/15/2023 CLINICAL DATA:  Cough EXAM: CHEST - 2 VIEW COMPARISON:  12/20/2018 FINDINGS: Cardiac shadow is prominent but accentuated by the frontal technique. Mild vascular congestion is noted with mild interstitial edema. No sizable effusion is seen. No focal infiltrate is noted. IMPRESSION: Changes consistent with CHF and mild edema. Electronically Signed   By: Oneil Devonshire M.D.   On: 10/15/2023 22:30     Medications:     Current Medications:  furosemide   40 mg Intravenous BID   heparin   5,000 Units Subcutaneous Q8H   isosorbide -hydrALAZINE   2 tablet Oral TID    Infusions:     Patient Profile   Cameron Stults is a 37 year old with a history of HIV on cabenuva  and CKD Stage IIIb. Followed by ID. Has not been seen by PCP/Nephrology. No previous cardiac history. He does not drink alcohol or smoke. His father has kidney disease and HTN. No family history of SCD.   Admitted with hypertensive emergency and acute HFrEF.   Assessment/Plan   1. Acute HFrEF Etiology unknown but suspect  HTN. No previous cardiac history. Has history of HIV . Once diuresed will consider CMRI. Echo - severely reduced LVEF < 20% and mildly reduced RV.  NYHA III. Marked Volume overload. Increase lasix  80 mg  twice a day. Add ted hose.  GDMT limited due to CKD.  Start Bidil  2 tab three times a day  Hold off on BB for now.   2. HTN Emergency  Continue to diurese and adding Bidil  as noted above. Co-pay for Bidil  $10.00  Hold off on BB for now.   3. AKI on CKD Stage IIIB Renal US - no hydronephrosis. Chronic medical renal disease No recent NSAIDs.  Would consult Nephrology.      Length of Stay: 0  Greig Mosses, NP  10/16/2023, 10:59 AM  Advanced Heart Failure Team Pager [phone] (M-F; 7a - 5p)  Please contact [facility] for night-coverage after hours (4p -7a ) and weekends on [email or web address]

## 2023-10-16 NOTE — Progress Notes (Signed)
 Brief same day note:  Patient is a 38 year old male with history of HIV on Cabenuva , CKD stage IIIb, depression presents with 1 week history of dyspnea on exertion, lower extremity edema, orthopnea.  On presentation he was hypertensive, tachycardic.  Workup in the ED showed creatinine of 3.1, baseline creatinine of 2.5.  BNP elevated at 1864.  RSV, flu, COVID-negative.  Chest x-ray showed CHF changes.  Started on IV Lasix .  Echo showed EF of less than 20%, global hypokinesis, grade 2 diastolic dysfunction.  Cardiology, nephrology consulted.  Patient seen and examined at bedside today.  During evaluation, he was overall comfortable, lying in bed.  On room air.  Denies any shortness of breath or cough.  Appears severely volume overloaded.  Lungs are clear on auscultation.  Has severe lower extremity edema.   Assessment and plan:  Acute HFrEF: Presented with dyspnea evaluation, lower extremity edema, orthopnea, anasarca.  Appeared volume overloaded on presentation.  Elevated BNP.  Chest x-ray with features of volume overload.  Started on IV Lasix .  Echo as above. Continue to monitor daily input/output, weight Heart failure team following  Hypertensive emergency: Severely  hypertensive on presentation.  Continue current medication.  Monitor blood pressure  Elevated troponin: This is likely from underlying CHF, AKI.  No anginal symptoms.  AKI in CKD stage IIIb: Baseline creatinine around 2.5.  Likely from cardiorenal syndrome from volume overload.  Check ultrasound of the kidneys.  Creatinine plateaued around 3.  Consulted nephrology  Cough/SIRS: Possible from volume overload causing pulm edema.  COVID/flu/RSV negative  HIV/history of hepatitis C/syphilis: Receives cabenuva  injection through ID clinic

## 2023-10-16 NOTE — Assessment & Plan Note (Signed)
 BP 183/136 on arrival-likely previously undiagnosed HTN (was 182/130, 04/2023) BP control with hydralazine and Nitropaste for now

## 2023-10-16 NOTE — ED Notes (Signed)
 ED TO INPATIENT HANDOFF REPORT  ED Nurse Name and Phone #: [phone]  S Name/Age/Gender [name] 38 y.o. male Room/Bed: 039C/039C  Code Status   Code Status: Full Code  Home/SNF/Other Home Patient oriented to: self, place, time, and situation Is this baseline? Yes   Triage Complete: Triage complete  Chief Complaint Anasarca [R60.1]  Triage Note Pt with URI symptoms x 1 week. Pt has been coughing up bloody sputum. No fevers.  Swelling noted to bilateral lower legs/ankles.  Dyspnea on exertion.    Allergies No Known Allergies  Level of Care/Admitting Diagnosis ED Disposition     ED Disposition  Admit   Condition  --   Comment  Hospital Area: [facility] [[id number]]  Level of Care: Telemetry Cardiac [[id number]]  May admit patient to [facility] or [facility] if equivalent level of care is available:: No  Covid Evaluation: Asymptomatic - no recent exposure (last 10 days) testing not required  Diagnosis: Anasarca [813517]  Admitting Physician: CLEATUS DELAYNE GAILS [[id number]]  Attending Physician: CLEATUS DELAYNE GAILS [[id number]]  Certification:: I certify this patient will need inpatient services for at least 2 midnights  Expected Medical Readiness: 10/19/2023          B Medical/Surgery History Past Medical History:  Diagnosis Date   HIV (human immunodeficiency virus infection) (HCC)    ITP (idiopathic thrombocytopenic purpura) 11/24/2013   Past Surgical History:  Procedure Laterality Date   ADENOIDECTOMY N/A 11/16/2013   Procedure: ADENOIDECTOMY;  Surgeon: Marlyce Finer, MD;  Location: [facility] OR;  Service: ENT;  Laterality: N/A;   MYRINGOTOMY WITH TUBE PLACEMENT Bilateral 11/16/2013   Procedure: MYRINGOTOMY WITH TUBE PLACEMENT;  Surgeon: Marlyce Finer, MD;  Location: [facility] OR;  Service: ENT;  Laterality: Bilateral;   NASAL ENDOSCOPY WITH EPISTAXIS CONTROL N/A 11/16/2013   Procedure: NASAL ENDOSCOPY with nasopharyngeal biopsy with frozen section and adnoidectomy;   Surgeon: Marlyce Finer, MD;  Location: [facility] OR;  Service: ENT;  Laterality: N/A;     A IV Location/Drains/Wounds Patient Lines/Drains/Airways Status     Active Line/Drains/Airways     Name Placement date Placement time Site Days   Peripheral IV 10/16/23 20 G Anterior;Right Forearm 10/16/23  0034  Forearm  less than 1   Myringotomy Tube 11/16/13  1848  Bilateral Ears  3621   Wound / Incision (Open or Dehisced) 11/25/13 Other (Comment) Nare Right;Left bleeding  11/25/13  2309  Nare  3612            Intake/Output Last 24 hours  Intake/Output Summary (Last 24 hours) at 10/16/2023 1250 Last data filed at 10/16/2023 9061 Gross per 24 hour  Intake --  Output 4275 ml  Net -4275 ml    Labs/Imaging Results for orders placed or performed during the hospital encounter of 10/15/23 (from the past 48 hours)  Resp panel by RT-PCR (RSV, Flu A&B, Covid) Anterior Nasal Swab     Status: None   Collection Time: 10/15/23 10:14 PM   Specimen: Anterior Nasal Swab  Result Value Ref Range   SARS Coronavirus 2 by RT PCR NEGATIVE NEGATIVE   Influenza A by PCR NEGATIVE NEGATIVE   Influenza B by PCR NEGATIVE NEGATIVE    Comment: (NOTE) The Xpert Xpress SARS-CoV-2/FLU/RSV plus assay is intended as an aid in the diagnosis of influenza from Nasopharyngeal swab specimens and should not be used as a sole basis for treatment. Nasal washings and aspirates are unacceptable for Xpert Xpress SARS-CoV-2/FLU/RSV testing.  Fact Sheet for Patients: bloggercourse.com  Fact  Sheet for Healthcare Providers: seriousbroker.it  This test is not yet approved or cleared by the [place]  FDA and has been authorized for detection and/or diagnosis of SARS-CoV-2 by FDA under an Emergency Use Authorization (EUA). This EUA will remain in effect (meaning this test can be used) for the duration of the COVID-19 declaration under Section 564(b)(1) of the Act, 21  U.S.C. section 360bbb-3(b)(1), unless the authorization is terminated or revoked.     Resp Syncytial Virus by PCR NEGATIVE NEGATIVE    Comment: (NOTE) Fact Sheet for Patients: bloggercourse.com  Fact Sheet for Healthcare Providers: seriousbroker.it  This test is not yet approved or cleared by the [place]  FDA and has been authorized for detection and/or diagnosis of SARS-CoV-2 by FDA under an Emergency Use Authorization (EUA). This EUA will remain in effect (meaning this test can be used) for the duration of the COVID-19 declaration under Section 564(b)(1) of the Act, 21 U.S.C. section 360bbb-3(b)(1), unless the authorization is terminated or revoked.  Performed at [facility], [address], [place], [place] [address]   Basic metabolic panel     Status: Abnormal   Collection Time: 10/15/23 10:21 PM  Result Value Ref Range   Sodium 136 135 - 145 mmol/L   Potassium 4.3 3.5 - 5.1 mmol/L   Chloride 107 98 - 111 mmol/L   CO2 21 (L) 22 - 32 mmol/L   Glucose, Bld 102 (H) 70 - 99 mg/dL    Comment: Glucose reference range applies only to samples taken after fasting for at least 8 hours.   BUN 30 (H) 6 - 20 mg/dL   Creatinine, Ser 6.88 (H) 0.61 - 1.24 mg/dL   Calcium 8.6 (L) 8.9 - 10.3 mg/dL   GFR, Estimated 25 (L) >60 mL/min    Comment: (NOTE) Calculated using the CKD-EPI Creatinine Equation (2021)    Anion gap 8 5 - 15    Comment: Performed at [facility], [address] [place], [place] [address]  CBC     Status: None   Collection Time: 10/15/23 10:21 PM  Result Value Ref Range   WBC 9.4 4.0 - 10.5 K/uL   RBC 4.80 4.22 - 5.81 MIL/uL   Hemoglobin 14.1 13.0 - 17.0 g/dL   HCT 57.4 60.9 - 47.9 %   MCV 88.5 80.0 - 100.0 fL   MCH 29.4 26.0 - 34.0 pg   MCHC 33.2 30.0 - 36.0 g/dL   RDW 86.6 88.4 - 84.4 %   Platelets 198 150 - 400 K/uL   nRBC 0.0 0.0 - 0.2 %    Comment: Performed at [facility],  [address], [place], [place] [address]  Brain natriuretic peptide     Status: Abnormal   Collection Time: 10/15/23 10:21 PM  Result Value Ref Range   B Natriuretic Peptide 1,864.1 (H) 0.0 - 100.0 pg/mL    Comment: Performed at [facility], [address], [place], [place] [address]  Troponin I (High Sensitivity)     Status: Abnormal   Collection Time: 10/16/23  2:10 AM  Result Value Ref Range   Troponin I (High Sensitivity) 78 (H) <18 ng/L    Comment: (NOTE) Elevated high sensitivity troponin I (hsTnI) values and significant  changes across serial measurements may suggest ACS but many other  chronic and acute conditions are known to elevate hsTnI results.  Refer to the Links section for chest pain algorithms and additional  guidance. Performed at [facility]  [facility], [address] [place], [place] [address]   Hepatic function panel     Status: Abnormal   Collection Time: 10/16/23  2:10 AM  Result Value Ref Range   Total Protein 5.8 (L) 6.5 - 8.1 g/dL   Albumin  2.5 (L) 3.5 - 5.0 g/dL   AST 50 (H) 15 - 41 U/L   ALT 40 0 - 44 U/L   Alkaline Phosphatase 94 38 - 126 U/L   Total Bilirubin 1.3 (H) 0.0 - 1.2 mg/dL   Bilirubin, Direct 0.2 0.0 - 0.2 mg/dL   Indirect Bilirubin 1.1 (H) 0.3 - 0.9 mg/dL    Comment: Performed at [facility], [address], [place], [place] [address]  Urinalysis, Complete w Microscopic -Urine, Clean Catch     Status: Abnormal   Collection Time: 10/16/23  2:10 AM  Result Value Ref Range   Color, Urine COLORLESS (A) YELLOW   APPearance CLEAR CLEAR   Specific Gravity, Urine 1.004 (L) 1.005 - 1.030   pH 6.0 5.0 - 8.0   Glucose, UA NEGATIVE NEGATIVE mg/dL   Hgb urine dipstick SMALL (A) NEGATIVE   Bilirubin Urine NEGATIVE NEGATIVE   Ketones, ur NEGATIVE NEGATIVE mg/dL   Protein, ur 899 (A) NEGATIVE mg/dL   Nitrite NEGATIVE NEGATIVE   Leukocytes,Ua NEGATIVE NEGATIVE   RBC / HPF 0-5 0 - 5 RBC/hpf   WBC, UA 0-5 0 - 5 WBC/hpf   Bacteria, UA NONE  SEEN NONE SEEN   Squamous Epithelial / HPF 0-5 0 - 5 /HPF    Comment: Performed at [facility], [address] [place], [place] [address]  Procalcitonin     Status: None   Collection Time: 10/16/23  2:10 AM  Result Value Ref Range   Procalcitonin <0.10 ng/mL    Comment:        Interpretation: PCT (Procalcitonin) <= 0.5 ng/mL: Systemic infection (sepsis) is not likely. Local bacterial infection is possible. (NOTE)       Sepsis PCT Algorithm           Lower Respiratory Tract                                      Infection PCT Algorithm    ----------------------------     ----------------------------         PCT < 0.25 ng/mL                PCT < 0.10 ng/mL          Strongly encourage             Strongly discourage   discontinuation of antibiotics    initiation of antibiotics    ----------------------------     -----------------------------       PCT 0.25 - 0.50 ng/mL            PCT 0.10 - 0.25 ng/mL               OR       >80% decrease in PCT            Discourage initiation of                                            antibiotics      Encourage discontinuation  of antibiotics    ----------------------------     -----------------------------         PCT >= 0.50 ng/mL              PCT 0.26 - 0.50 ng/mL               AND        <80% decrease in PCT             Encourage initiation of                                             antibiotics       Encourage continuation           of antibiotics    ----------------------------     -----------------------------        PCT >= 0.50 ng/mL                  PCT > 0.50 ng/mL               AND         increase in PCT                  Strongly encourage                                      initiation of antibiotics    Strongly encourage escalation           of antibiotics                                     -----------------------------                                           PCT <= 0.25 ng/mL                                                  OR                                        > 80% decrease in PCT                                      Discontinue / Do not initiate                                             antibiotics  Performed at [facility], [address], [place], [place] [address]   Troponin I (High Sensitivity)     Status: Abnormal   Collection Time: 10/16/23  3:57 AM  Result Value Ref Range   Troponin I (High Sensitivity)  81 (H) <18 ng/L    Comment: (NOTE) Elevated high sensitivity troponin I (hsTnI) values and significant  changes across serial measurements may suggest ACS but many other  chronic and acute conditions are known to elevate hsTnI results.  Refer to the Links section for chest pain algorithms and additional  guidance. Performed at [facility], [address] [place], [place] [address]   Basic metabolic panel     Status: Abnormal   Collection Time: 10/16/23  8:40 AM  Result Value Ref Range   Sodium 139 135 - 145 mmol/L   Potassium 3.8 3.5 - 5.1 mmol/L   Chloride 107 98 - 111 mmol/L   CO2 23 22 - 32 mmol/L   Glucose, Bld 116 (H) 70 - 99 mg/dL    Comment: Glucose reference range applies only to samples taken after fasting for at least 8 hours.   BUN 28 (H) 6 - 20 mg/dL   Creatinine, Ser 6.88 (H) 0.61 - 1.24 mg/dL   Calcium 8.5 (L) 8.9 - 10.3 mg/dL   GFR, Estimated 25 (L) >60 mL/min    Comment: (NOTE) Calculated using the CKD-EPI Creatinine Equation (2021)    Anion gap 9 5 - 15    Comment: Performed at [facility], [address], [place], [place] [address]   US  RENAL Result Date: 10/16/2023 CLINICAL DATA:  Acute renal failure EXAM: RENAL / URINARY TRACT ULTRASOUND COMPLETE COMPARISON:  None Available. FINDINGS: Right Kidney: Renal measurements: 10.1 x 3.7 x 5.5 cm = volume: 107.6 mL. Increased echogenicity. No mass or hydronephrosis visualized. Left Kidney: Renal measurements: 9.7 x 4.3 x 4.1 cm = volume: 90.7 mL. Increased echogenicity. No  mass or hydronephrosis visualized. Bladder: Appears normal for degree of bladder distention. Other: None. IMPRESSION: 1. No hydronephrosis. 2. Increased renal echogenicity as can be seen with chronic medical renal disease. Electronically Signed   By: Bard Moats M.D.   On: 10/16/2023 09:20   ECHOCARDIOGRAM COMPLETE Result Date: 10/16/2023    ECHOCARDIOGRAM REPORT   Patient Name:   DAXTER PAULE Date of Exam: 10/16/2023 Medical Rec #:  [id number]     Height:       73.0 in Accession #:    [id number]    Weight:       223.0 lb Date of Birth:  05-13-1986     BSA:          2.254 m Patient Age:    37 years      BP:           164/123 mmHg Patient Gender: M             HR:           97 bpm. Exam Location:  Inpatient Procedure: 2D Echo, Color Doppler, Cardiac Doppler and Intracardiac            Opacification Agent Indications:    I50.31 Acute diastolic (congestive) heart failure  History:        Patient has no prior history of Echocardiogram examinations.  Sonographer:    Damien Senior RDCS Referring Phys: [id number] DELAYNE LULLA SOLIAN IMPRESSIONS  1. Prominent LV trabeculations without evidence of thrombus with contrast. Left ventricular ejection fraction, by estimation, is <20%. Left ventricular ejection fraction by 2D MOD biplane is 18.5 %. The left ventricle has severely decreased function. The left ventricle demonstrates global hypokinesis. The left ventricular internal cavity size was moderately dilated. There is mild concentric left ventricular hypertrophy. Left ventricular diastolic parameters  are consistent with Grade II diastolic dysfunction (pseudonormalization).  2. Right ventricular systolic function is mildly reduced. The right ventricular size is mildly enlarged. There is mildly elevated pulmonary artery systolic pressure. The estimated right ventricular systolic pressure is 42.9 mmHg.  3. The mitral valve is normal in structure. Mild mitral valve regurgitation. No evidence of mitral stenosis.  4. The aortic valve is  tricuspid. Aortic valve regurgitation is not visualized. No aortic stenosis is present.  5. There is mild dilatation of the ascending aorta, measuring 41 mm.  6. The inferior vena cava is dilated in size with <50% respiratory variability, suggesting right atrial pressure of 15 mmHg.  7. Evidence of atrial level shunting detected by color flow Doppler. There is a small atrial septal defect with predominantly left to right shunting across the atrial septum. FINDINGS  Left Ventricle: Prominent LV trabeculations without evidence of thrombus with contrast. Left ventricular ejection fraction, by estimation, is <20%. Left ventricular ejection fraction by 2D MOD biplane is 18.5 %. The left ventricle has severely decreased  function. The left ventricle demonstrates global hypokinesis. Definity  contrast agent was given IV to delineate the left ventricular endocardial borders. The left ventricular internal cavity size was moderately dilated. There is mild concentric left ventricular hypertrophy. Left ventricular diastolic parameters are consistent with Grade II diastolic dysfunction (pseudonormalization). Right Ventricle: The right ventricular size is mildly enlarged. No increase in right ventricular wall thickness. Right ventricular systolic function is mildly reduced. There is mildly elevated pulmonary artery systolic pressure. The tricuspid regurgitant  velocity is 2.64 m/s, and with an assumed right atrial pressure of 15 mmHg, the estimated right ventricular systolic pressure is 42.9 mmHg. Left Atrium: Left atrial size was normal in size. Right Atrium: Right atrial size was normal in size. Pericardium: There is no evidence of pericardial effusion. Mitral Valve: The mitral valve is normal in structure. Mild mitral valve regurgitation. No evidence of mitral valve stenosis. Tricuspid Valve: The tricuspid valve is normal in structure. Tricuspid valve regurgitation is mild. Aortic Valve: The aortic valve is tricuspid. Aortic  valve regurgitation is not visualized. No aortic stenosis is present. Pulmonic Valve: The pulmonic valve was normal in structure. Pulmonic valve regurgitation is trivial. Aorta: The aortic root is normal in size and structure. There is mild dilatation of the ascending aorta, measuring 41 mm. Venous: The inferior vena cava is dilated in size with less than 50% respiratory variability, suggesting right atrial pressure of 15 mmHg. IAS/Shunts: Evidence of atrial level shunting detected by color flow Doppler. There is a small atrial septal defect with predominantly left to right shunting across the atrial septum.  LEFT VENTRICLE PLAX 2D                        Biplane EF (MOD) LVIDd:         6.50 cm         LV Biplane EF:   Left LVIDs:         5.70 cm                          ventricular LV PW:         1.10 cm                          ejection LV IVS:        1.10 cm  fraction by LVOT diam:     2.10 cm                          2D MOD LV SV:         50                               biplane is LV SV Index:   22                               18.5 %. LVOT Area:     3.46 cm                                Diastology                                LV e' medial:    9.36 cm/s LV Volumes (MOD)               LV E/e' medial:  10.3 LV vol d, MOD    242.0 ml      LV e' lateral:   8.16 cm/s A2C:                           LV E/e' lateral: 11.8 LV vol d, MOD    204.0 ml A4C: LV vol s, MOD    189.0 ml A2C: LV vol s, MOD    170.0 ml A4C: LV SV MOD A2C:   53.0 ml LV SV MOD A4C:   204.0 ml LV SV MOD BP:    40.9 ml RIGHT VENTRICLE RV S prime:     10.40 cm/s TAPSE (M-mode): 2.5 cm LEFT ATRIUM              Index        RIGHT ATRIUM           Index LA diam:        4.10 cm  1.82 cm/m   RA Area:     20.10 cm LA Vol (A2C):   154.0 ml 68.34 ml/m  RA Volume:   63.00 ml  27.96 ml/m LA Vol (A4C):   89.8 ml  39.85 ml/m LA Biplane Vol: 119.0 ml 52.81 ml/m  AORTIC VALVE LVOT Vmax:   90.20 cm/s LVOT Vmean:  69.300 cm/s LVOT  VTI:    0.145 m  AORTA Ao Root diam: 3.70 cm Ao Asc diam:  4.10 cm MITRAL VALVE               TRICUSPID VALVE MV Area (PHT): 4.29 cm    TR Peak grad:   27.9 mmHg MV Decel Time: 177 msec    TR Vmax:        264.00 cm/s MV E velocity: 96.40 cm/s MV A velocity: 29.90 cm/s  SHUNTS MV E/A ratio:  3.22        Systemic VTI:  0.14 m                            Systemic Diam: 2.10 cm Dalton McleanMD Electronically signed by Ezra Kanner Signature Date/Time: 10/16/2023/8:58:07 AM    Final  DG Chest 2 View Result Date: 10/15/2023 CLINICAL DATA:  Cough EXAM: CHEST - 2 VIEW COMPARISON:  12/20/2018 FINDINGS: Cardiac shadow is prominent but accentuated by the frontal technique. Mild vascular congestion is noted with mild interstitial edema. No sizable effusion is seen. No focal infiltrate is noted. IMPRESSION: Changes consistent with CHF and mild edema. Electronically Signed   By: Oneil Devonshire M.D.   On: 10/15/2023 22:30    Pending Labs Unresulted Labs (From admission, onward)     Start     Ordered   10/17/23 0500  Basic metabolic panel  Tomorrow morning,   R        10/16/23 0839   10/16/23 1138  TSH  Once,   R        10/16/23 1137            Vitals/Pain Today's Vitals   10/16/23 1115 10/16/23 1130 10/16/23 1200 10/16/23 1245  BP: (!) 157/140 (!) 167/138 (!) 163/121 (!) 153/113  Pulse: (!) 106 (!) 104 96 (!) 108  Resp: 19 14 11 20   Temp:      TempSrc:      SpO2: 98% 97% 98% 99%  PainSc:        Isolation Precautions No active isolations  Medications Medications  acetaminophen  (TYLENOL ) tablet 650 mg (650 mg Oral Given 10/16/23 1248)    Or  acetaminophen  (TYLENOL ) suppository 650 mg ( Rectal See Alternative 10/16/23 1248)  ondansetron  (ZOFRAN ) tablet 4 mg (has no administration in time range)    Or  ondansetron  (ZOFRAN ) injection 4 mg (has no administration in time range)  heparin  injection 5,000 Units (5,000 Units Subcutaneous Given 10/16/23 0554)  guaiFENesin  (ROBITUSSIN) 100 MG/5ML  liquid 5 mL (has no administration in time range)  perflutren  lipid microspheres (DEFINITY ) IV suspension (4 mLs Intravenous Given 10/16/23 0857)  isosorbide -hydrALAZINE  (BIDIL ) 20-37.5 MG per tablet 2 tablet (2 tablets Oral Given 10/16/23 1201)  furosemide  (LASIX ) injection 80 mg (has no administration in time range)  furosemide  (LASIX ) injection 40 mg (40 mg Intravenous Given 10/16/23 0035)  nitroGLYCERIN  (NITROGLYN) 2 % ointment 0.5 inch (0.5 inches Topical Given 10/16/23 0211)    Mobility walks     Focused Assessments Cough leg swelling   R Recommendations: See Admitting Provider Note  Report given to:   Additional Notes: dx chf. Patient need ted stocking swelling in leg

## 2023-10-16 NOTE — H&P (Addendum)
 History and Physical    Patient: [name] FMW:[id number] DOB: June 17, 1986 DOA: 10/15/2023 DOS: the patient was seen and examined on 10/16/2023 PCP: Patient, No Pcp Per  Patient coming from: Home  Chief Complaint:  Chief Complaint  Patient presents with   Cough   Leg Swelling    HPI: [name] is a 38 y.o. male with medical history significant for HIV on Cabenuva , CKD 3B, depression, followed outpatient by infectious diseases last seen 04/2023 when he was referred to primary care to establish care for monitoring of CKD (note reviewed), who presents to the ED with a 1 week history of dyspnea on exertion, lower extremity edema and orthopnea.  He endorses a cough occasionally productive of blood-streaked sputum.  He denies fever or chills and denies chest pains. ED course and data Review: BP 183/136, pulse 115 and respirations 25 with O2 sat 97% on room air.  Afebrile. Workup notable for the following: Creatinine 3.11 up from baseline of 2.5 in July 2024 BNP 1864 CBC WNL Respiratory viral panel negative for COVID flu and RSV EKG, personally viewed and interpreted showing sinus tachycardia at 104 with no acute ST-T wave changes Chest x-ray consistent with CHF and mild edema  Patient treated with IV Lasix  40 mg Hospitalist consulted for admission.   Review of Systems: As mentioned in the history of present illness. All other systems reviewed and are negative.  Past Medical History:  Diagnosis Date   HIV (human immunodeficiency virus infection) (HCC)    ITP (idiopathic thrombocytopenic purpura) 11/24/2013   Past Surgical History:  Procedure Laterality Date   ADENOIDECTOMY N/A 11/16/2013   Procedure: ADENOIDECTOMY;  Surgeon: Marlyce Finer, MD;  Location: [facility] OR;  Service: ENT;  Laterality: N/A;   MYRINGOTOMY WITH TUBE PLACEMENT Bilateral 11/16/2013   Procedure: MYRINGOTOMY WITH TUBE PLACEMENT;  Surgeon: Marlyce Finer, MD;  Location: [facility] OR;  Service: ENT;  Laterality: Bilateral;    NASAL ENDOSCOPY WITH EPISTAXIS CONTROL N/A 11/16/2013   Procedure: NASAL ENDOSCOPY with nasopharyngeal biopsy with frozen section and adnoidectomy;  Surgeon: Marlyce Finer, MD;  Location: [facility] OR;  Service: ENT;  Laterality: N/A;   Social History:  reports that he quit smoking about 9 years ago. His smoking use included cigarettes. He started smoking about 21 years ago. He has a 1.2 pack-year smoking history. He has never used smokeless tobacco. He reports that he does not drink alcohol and does not use drugs.  No Known Allergies  Family History  Problem Relation Age of Onset   Hypertension Neg Hx    Diabetes Neg Hx     Prior to Admission medications   Medication Sig Start Date End Date Taking? Authorizing Provider  cabotegravir  & rilpivirine  ER (CABENUVA ) 600 & 900 MG/3ML injection Inject 1 kit into the muscle every 2 (two) months. 05/06/23   Waddell Alan PARAS, RPH-CPP  fluticasone  (FLONASE ) 50 MCG/ACT nasal spray Place into both nostrils daily. Patient not taking: Reported on 12/03/2022    [provider]  methocarbamol  (ROBAXIN ) 500 MG tablet Take 1 tablet (500 mg total) by mouth 2 (two) times daily. Patient not taking: Reported on 12/03/2022 12/20/18   Rendell Lorane HERO, PA-C  oxymetazoline  (AFRIN) 0.05 % nasal spray Place 1 spray into both nostrils 2 (two) times daily. Patient not taking: Reported on 12/03/2022    [provider]    Physical Exam: Vitals:   10/15/23 2300 10/16/23 0000 10/16/23 0015 10/16/23 0043  BP: (!) 172/131   (!) 176/131  Pulse: (!) 108 98 ROLLEN)  102 (!) 102  Resp: 18 (!) 21 (!) 21 19  Temp:    98.7 F (37.1 C)  TempSrc:    Oral  SpO2: 100% 100% 99% 100%   Physical Exam Vitals and nursing note reviewed.  Constitutional:      General: He is not in acute distress. HENT:     Head: Normocephalic and atraumatic.  Cardiovascular:     Rate and Rhythm: Regular rhythm. Tachycardia present.     Heart sounds: Normal heart sounds.  Pulmonary:      Effort: Tachypnea present.     Breath sounds: Rales present.  Abdominal:     Palpations: Abdomen is soft.     Tenderness: There is no abdominal tenderness.  Musculoskeletal:     Right lower leg: 3+ Edema present.     Left lower leg: 3+ Edema present.  Neurological:     Mental Status: Mental status is at baseline.     Labs on Admission: I have personally reviewed following labs and imaging studies  CBC: Recent Labs  Lab 10/15/23 2221  WBC 9.4  HGB 14.1  HCT 42.5  MCV 88.5  PLT 198   Basic Metabolic Panel: Recent Labs  Lab 10/15/23 2221  NA 136  K 4.3  CL 107  CO2 21*  GLUCOSE 102*  BUN 30*  CREATININE 3.11*  CALCIUM 8.6*   GFR: CrCl cannot be calculated (Unknown ideal weight.). Liver Function Tests: No results for input(s): AST, ALT, ALKPHOS, BILITOT, PROT, ALBUMIN  in the last 168 hours. No results for input(s): LIPASE, AMYLASE in the last 168 hours. No results for input(s): AMMONIA in the last 168 hours. Coagulation Profile: No results for input(s): INR, PROTIME in the last 168 hours. Cardiac Enzymes: No results for input(s): CKTOTAL, CKMB, CKMBINDEX, TROPONINI in the last 168 hours. BNP (last 3 results) No results for input(s): PROBNP in the last 8760 hours. HbA1C: No results for input(s): HGBA1C in the last 72 hours. CBG: No results for input(s): GLUCAP in the last 168 hours. Lipid Profile: No results for input(s): CHOL, HDL, LDLCALC, TRIG, CHOLHDL, LDLDIRECT in the last 72 hours. Thyroid Function Tests: No results for input(s): TSH, T4TOTAL, FREET4, T3FREE, THYROIDAB in the last 72 hours. Anemia Panel: No results for input(s): VITAMINB12, FOLATE, FERRITIN, TIBC, IRON , RETICCTPCT in the last 72 hours. Urine analysis:    Component Value Date/Time   COLORURINE YELLOW 04/02/2023 0852   APPEARANCEUR CLEAR 04/02/2023 0852   LABSPEC 1.032 (H) 04/02/2023 0852   PHURINE 6.0 04/02/2023  0852   GLUCOSEU NEGATIVE 04/02/2023 0852   HGBUR NEGATIVE 04/02/2023 0852   BILIRUBINUR NEGATIVE 04/02/2023 0852   KETONESUR NEGATIVE 04/02/2023 0852   PROTEINUR >=300 (A) 04/02/2023 0852   NITRITE NEGATIVE 04/02/2023 0852   LEUKOCYTESUR NEGATIVE 04/02/2023 0852    Radiological Exams on Admission: DG Chest 2 View Result Date: 10/15/2023 CLINICAL DATA:  Cough EXAM: CHEST - 2 VIEW COMPARISON:  12/20/2018 FINDINGS: Cardiac shadow is prominent but accentuated by the frontal technique. Mild vascular congestion is noted with mild interstitial edema. No sizable effusion is seen. No focal infiltrate is noted. IMPRESSION: Changes consistent with CHF and mild edema. Electronically Signed   By: Oneil Devonshire M.D.   On: 10/15/2023 22:30     Data Reviewed: Relevant notes from primary care and specialist visits, past discharge summaries as available in EHR, including Care Everywhere. Prior diagnostic testing as pertinent to current admission diagnoses Updated medications and problem lists for reconciliation ED course, including vitals, labs, imaging, treatment and  response to treatment Triage notes, nursing and pharmacy notes and ED provider's notes Notable results as noted in HPI   Assessment and Plan: * Acute heart failure (HCC) Hypertensive emergency Anasarca, cardiac versus renal Clinically fluid overloaded with DOE, orthopnea, BNP over 1800 and chest x-ray with edema IV Lasix  with close monitoring of renal function BP control-hydralazine  and Nitropaste (no ACE/ARB given renal function.  Holding beta-blocker in the acute CHF setting) Echocardiogram in the a.m. Added on troponin and hepatic function panel Daily weights with intake and output monitoring Cardiology consult in a.m.  Hypertensive emergency BP 183/136 on arrival-likely previously undiagnosed HTN (was 182/130, 04/2023) BP control with hydralazine  and Nitropaste for now  Acute renal failure superimposed on stage 3b chronic kidney  disease (HCC) Creatinine 3.11 up from baseline of 2.10 April 2023 Patient was referred to internal medicine several months prior but did not make it to the visit Monitor renal function in the setting of IV Lasix  Will get baseline urinalysis Nephrology consult in the a.m.  Cough SIRS Patient reports a cough with blood-tinged sputum Tachycardic and tachypneic on arrival but afebrile and with normal WBC Low suspicion for pneumonia-flu RSV and COVID PCR negative and chest x-ray more consistent with CHF Possible upper respiratory tract infection versus related to CHF Antitussives, symptom control  Human immunodeficiency virus (HIV) disease (HCC) History of hep C  and syphilis Receives Cabenuva  injections through ID clinic(last shot 09/05/23, next due 10/21/2023)     DVT prophylaxis: heparin   Consults: for card and renal consults in the am  Advance Care Planning:   Code Status: Prior   Family Communication: none  Disposition Plan: Back to previous home environment  Severity of Illness: The appropriate patient status for this patient is INPATIENT. Inpatient status is judged to be reasonable and necessary in order to provide the required intensity of service to ensure the patient's safety. The patient's presenting symptoms, physical exam findings, and initial radiographic and laboratory data in the context of their chronic comorbidities is felt to place them at high risk for further clinical deterioration. Furthermore, it is not anticipated that the patient will be medically stable for discharge from the hospital within 2 midnights of admission.   * I certify that at the point of admission it is my clinical judgment that the patient will require inpatient hospital care spanning beyond 2 midnights from the point of admission due to high intensity of service, high risk for further deterioration and high frequency of surveillance required.*  Author: Delayne LULLA Solian, MD 10/16/2023 1:37  AM  For on call review [email or web address].

## 2023-10-16 NOTE — Assessment & Plan Note (Addendum)
 SIRS Patient reports a cough with blood-tinged sputum Tachycardic and tachypneic on arrival but afebrile and with normal WBC Low suspicion for pneumonia-flu RSV and COVID PCR negative and chest x-ray more consistent with CHF Possible upper respiratory tract infection versus related to CHF Antitussives, symptom control

## 2023-10-16 NOTE — Telephone Encounter (Signed)
 Pharmacy Patient Advocate Encounter  Insurance verification completed.    The patient is insured through [employer]/ [employer].     Ran test claim for Bidil  and the current 30 day co-pay is $10.00.   This test claim was processed through [facility]- copay amounts may vary at other pharmacies due to pharmacy/plan contracts, or as the patient moves through the different stages of their insurance plan.

## 2023-10-16 NOTE — Progress Notes (Signed)
 Echocardiogram 2D Echocardiogram has been performed.  Warren Lacy Kleigh Hoelzer RDCS 10/16/2023, 8:57 AM

## 2023-10-16 NOTE — Hospital Course (Signed)
 [name]

## 2023-10-16 NOTE — Assessment & Plan Note (Addendum)
 Hypertensive emergency Anasarca, cardiac versus renal Clinically fluid overloaded with DOE, orthopnea, BNP over 1800 and chest x-ray with edema IV Lasix  with close monitoring of renal function BP control-hydralazine  and Nitropaste (no ACE/ARB given renal function.  Holding beta-blocker in the acute CHF setting) Echocardiogram in the a.m. Added on troponin and hepatic function panel Daily weights with intake and output monitoring Cardiology consult in a.m.

## 2023-10-16 NOTE — Progress Notes (Signed)
 Heart Failure Navigator Progress Note  Assessed for Heart & Vascular TOC clinic readiness.  Patient does not meet criteria due to Advanced Heart Failure Team consulted, .   Navigator will sign off at this time.   Stephane Haddock, BSN, Scientist, Clinical (histocompatibility And Immunogenetics) Only

## 2023-10-16 NOTE — Consult Note (Signed)
 Advanced Heart Failure Team Consult Note   Primary Physician: Patient, No Pcp Per Cardiologist:  None  Reason for Consultation: Acute HFrEF  HPI:    Devonn Giampietro is seen today for evaluation of heart failure at the request of  Dr Jillian.   Mr Kirschenmann is a 38 year old with a history of HIV on cabenuva  and CKD Stage IIIb. Followed by ID. Has not been seen by PCP/Nephrology. No previous cardiac history. He does not drink alcohol or smoke. His father has kidney disease and HTN. No family history of SCD.   2 weeks ago he started noticing lower extremity swelling. Last week developed a cough and took OTC with no benefit. Progressive dyspnea with exertion and orthopnea. He has had a hard time sleeping. Works remotely for [employer].   Presented to ED with leg edema. Hypertensive on arrival 172/131. SABRACXR with pulmonary edema. BNP 1864, creatinine 3.1, CO2 21, and HS trop 78>81. Started on 40 mg IV lasix , hydralazine , and nitropaste. Echo EF severely reduced LVEF < 20% global HK and RV mildly reduced.   Renal US  - no hydronephrosis. Increased renal echogenicity. Brisk diuresis noted.   Home Medications Prior to Admission medications   Medication Sig Start Date End Date Taking? Authorizing Provider  acetaminophen  (TYLENOL ) 500 MG tablet Take 1,000 mg by mouth every 6 (six) hours as needed for mild pain (pain score 1-3), fever or headache.   Yes [provider]  cabotegravir  & rilpivirine  ER (CABENUVA ) 600 & 900 MG/3ML injection Inject 1 kit into the muscle every 2 (two) months. 05/06/23  Yes Waddell Alan PARAS, RPH-CPP    Past Medical History: Past Medical History:  Diagnosis Date   HIV (human immunodeficiency virus infection) (HCC)    ITP (idiopathic thrombocytopenic purpura) 11/24/2013    Past Surgical History: Past Surgical History:  Procedure Laterality Date   ADENOIDECTOMY N/A 11/16/2013   Procedure: ADENOIDECTOMY;  Surgeon: Marlyce Finer, MD;  Location: [facility] OR;   Service: ENT;  Laterality: N/A;   MYRINGOTOMY WITH TUBE PLACEMENT Bilateral 11/16/2013   Procedure: MYRINGOTOMY WITH TUBE PLACEMENT;  Surgeon: Marlyce Finer, MD;  Location: [facility] OR;  Service: ENT;  Laterality: Bilateral;   NASAL ENDOSCOPY WITH EPISTAXIS CONTROL N/A 11/16/2013   Procedure: NASAL ENDOSCOPY with nasopharyngeal biopsy with frozen section and adnoidectomy;  Surgeon: Marlyce Finer, MD;  Location: [facility] OR;  Service: ENT;  Laterality: N/A;    Family History: Family History  Problem Relation Age of Onset   Hypertension Neg Hx    Diabetes Neg Hx     Social History: Social History   Socioeconomic History   Marital status: Single    Spouse name: Not on file   Number of children: Not on file   Years of education: Not on file   Highest education level: Not on file  Occupational History   Not on file  Tobacco Use   Smoking status: Former    Current packs/day: 0.00    Average packs/day: 0.1 packs/day for 12.5 years (1.2 ttl pk-yrs)    Types: Cigarettes    Start date: 12/30/2001    Quit date: 06/26/2014    Years since quitting: 9.3   Smokeless tobacco: Never   Tobacco comments:    pt states he wants to quit by his birthday in July  Vaping Use   Vaping status: Every Day  Substance and Sexual Activity   Alcohol use: No   Drug use: No   Sexual activity: Not Currently  Partners: Female, Male    Birth control/protection: Condom    Comment: declined condoms  Other Topics Concern   Not on file  Social History Narrative   Not on file   Social Drivers of Health   Financial Resource Strain: Not on file  Food Insecurity: Not on file  Transportation Needs: Not on file  Physical Activity: Not on file  Stress: Not on file  Social Connections: Not on file    Allergies:  No Known Allergies  Objective:    Vital Signs:   Temp:  [97.5 F (36.4 C)-98.7 F (37.1 C)] 97.6 F (36.4 C) (01/10 0702) Pulse Rate:  [90-115] 101 (01/10 0702) Resp:  [13-30] 22 (01/10 0702) BP:  (159-190)/(122-156) 159/122 (01/10 0702) SpO2:  [89 %-100 %] 99 % (01/10 0934)    Weight change: There were no vitals filed for this visit.  Intake/Output:   Intake/Output Summary (Last 24 hours) at 10/16/2023 1059 Last data filed at 10/16/2023 0938 Gross per 24 hour  Intake --  Output 4275 ml  Net -4275 ml      Physical Exam    General:  No resp difficulty HEENT: normal Neck: supple. JVP to jaw  Carotids 2+ bilat; no bruits. No lymphadenopathy or thyromegaly appreciated. Cor: PMI nondisplaced. Regular rate & rhythm. No rubs, murmurs. + S3  Lungs: clear Abdomen: soft, nontender, nondistended. No hepatosplenomegaly. No bruits or masses. Good bowel sounds. Extremities: warm, no cyanosis, clubbing, rash, R and LLE 3+ edema Neuro: alert & orientedx3, cranial nerves grossly intact. moves all 4 extremities w/o difficulty. Affect pleasant   Telemetry   ST 100s   EKG    ST 104 narrow QRS   Labs   Basic Metabolic Panel: Recent Labs  Lab 10/15/23 2221 10/16/23 0840  NA 136 139  K 4.3 3.8  CL 107 107  CO2 21* 23  GLUCOSE 102* 116*  BUN 30* 28*  CREATININE 3.11* 3.11*  CALCIUM 8.6* 8.5*    Liver Function Tests: Recent Labs  Lab 10/16/23 0210  AST 50*  ALT 40  ALKPHOS 94  BILITOT 1.3*  PROT 5.8*  ALBUMIN  2.5*   No results for input(s): LIPASE, AMYLASE in the last 168 hours. No results for input(s): AMMONIA in the last 168 hours.  CBC: Recent Labs  Lab 10/15/23 2221  WBC 9.4  HGB 14.1  HCT 42.5  MCV 88.5  PLT 198    Cardiac Enzymes: No results for input(s): CKTOTAL, CKMB, CKMBINDEX, TROPONINI in the last 168 hours.  BNP: BNP (last 3 results) Recent Labs    10/15/23 2221  BNP 1,864.1*    ProBNP (last 3 results) No results for input(s): PROBNP in the last 8760 hours.   CBG: No results for input(s): GLUCAP in the last 168 hours.  Coagulation Studies: No results for input(s): LABPROT, INR in the last 72  hours.   Imaging   US  RENAL Result Date: 10/16/2023 CLINICAL DATA:  Acute renal failure EXAM: RENAL / URINARY TRACT ULTRASOUND COMPLETE COMPARISON:  None Available. FINDINGS: Right Kidney: Renal measurements: 10.1 x 3.7 x 5.5 cm = volume: 107.6 mL. Increased echogenicity. No mass or hydronephrosis visualized. Left Kidney: Renal measurements: 9.7 x 4.3 x 4.1 cm = volume: 90.7 mL. Increased echogenicity. No mass or hydronephrosis visualized. Bladder: Appears normal for degree of bladder distention. Other: None. IMPRESSION: 1. No hydronephrosis. 2. Increased renal echogenicity as can be seen with chronic medical renal disease. Electronically Signed   By: Bard Moats M.D.   On: 10/16/2023  09:20   ECHOCARDIOGRAM COMPLETE Result Date: 10/16/2023    ECHOCARDIOGRAM REPORT   Patient Name:   HAADI SANTELLAN Date of Exam: 10/16/2023 Medical Rec #:  [id number]     Height:       73.0 in Accession #:    [id number]    Weight:       223.0 lb Date of Birth:  27-Nov-1985     BSA:          2.254 m Patient Age:    37 years      BP:           164/123 mmHg Patient Gender: M             HR:           97 bpm. Exam Location:  Inpatient Procedure: 2D Echo, Color Doppler, Cardiac Doppler and Intracardiac            Opacification Agent Indications:    I50.31 Acute diastolic (congestive) heart failure  History:        Patient has no prior history of Echocardiogram examinations.  Sonographer:    Damien Senior RDCS Referring Phys: [id number] DELAYNE LULLA SOLIAN IMPRESSIONS  1. Prominent LV trabeculations without evidence of thrombus with contrast. Left ventricular ejection fraction, by estimation, is <20%. Left ventricular ejection fraction by 2D MOD biplane is 18.5 %. The left ventricle has severely decreased function. The left ventricle demonstrates global hypokinesis. The left ventricular internal cavity size was moderately dilated. There is mild concentric left ventricular hypertrophy. Left ventricular diastolic parameters are consistent with  Grade II diastolic dysfunction (pseudonormalization).  2. Right ventricular systolic function is mildly reduced. The right ventricular size is mildly enlarged. There is mildly elevated pulmonary artery systolic pressure. The estimated right ventricular systolic pressure is 42.9 mmHg.  3. The mitral valve is normal in structure. Mild mitral valve regurgitation. No evidence of mitral stenosis.  4. The aortic valve is tricuspid. Aortic valve regurgitation is not visualized. No aortic stenosis is present.  5. There is mild dilatation of the ascending aorta, measuring 41 mm.  6. The inferior vena cava is dilated in size with <50% respiratory variability, suggesting right atrial pressure of 15 mmHg.  7. Evidence of atrial level shunting detected by color flow Doppler. There is a small atrial septal defect with predominantly left to right shunting across the atrial septum. FINDINGS  Left Ventricle: Prominent LV trabeculations without evidence of thrombus with contrast. Left ventricular ejection fraction, by estimation, is <20%. Left ventricular ejection fraction by 2D MOD biplane is 18.5 %. The left ventricle has severely decreased  function. The left ventricle demonstrates global hypokinesis. Definity  contrast agent was given IV to delineate the left ventricular endocardial borders. The left ventricular internal cavity size was moderately dilated. There is mild concentric left ventricular hypertrophy. Left ventricular diastolic parameters are consistent with Grade II diastolic dysfunction (pseudonormalization). Right Ventricle: The right ventricular size is mildly enlarged. No increase in right ventricular wall thickness. Right ventricular systolic function is mildly reduced. There is mildly elevated pulmonary artery systolic pressure. The tricuspid regurgitant  velocity is 2.64 m/s, and with an assumed right atrial pressure of 15 mmHg, the estimated right ventricular systolic pressure is 42.9 mmHg. Left Atrium: Left  atrial size was normal in size. Right Atrium: Right atrial size was normal in size. Pericardium: There is no evidence of pericardial effusion. Mitral Valve: The mitral valve is normal in structure. Mild mitral valve regurgitation. No evidence of mitral valve stenosis. Tricuspid Valve:  The tricuspid valve is normal in structure. Tricuspid valve regurgitation is mild. Aortic Valve: The aortic valve is tricuspid. Aortic valve regurgitation is not visualized. No aortic stenosis is present. Pulmonic Valve: The pulmonic valve was normal in structure. Pulmonic valve regurgitation is trivial. Aorta: The aortic root is normal in size and structure. There is mild dilatation of the ascending aorta, measuring 41 mm. Venous: The inferior vena cava is dilated in size with less than 50% respiratory variability, suggesting right atrial pressure of 15 mmHg. IAS/Shunts: Evidence of atrial level shunting detected by color flow Doppler. There is a small atrial septal defect with predominantly left to right shunting across the atrial septum.  LEFT VENTRICLE PLAX 2D                        Biplane EF (MOD) LVIDd:         6.50 cm         LV Biplane EF:   Left LVIDs:         5.70 cm                          ventricular LV PW:         1.10 cm                          ejection LV IVS:        1.10 cm                          fraction by LVOT diam:     2.10 cm                          2D MOD LV SV:         50                               biplane is LV SV Index:   22                               18.5 %. LVOT Area:     3.46 cm                                Diastology                                LV e' medial:    9.36 cm/s LV Volumes (MOD)               LV E/e' medial:  10.3 LV vol d, MOD    242.0 ml      LV e' lateral:   8.16 cm/s A2C:                           LV E/e' lateral: 11.8 LV vol d, MOD    204.0 ml A4C: LV vol s, MOD    189.0 ml A2C: LV vol s, MOD    170.0 ml A4C: LV SV MOD A2C:   53.0 ml LV SV MOD A4C:   204.0 ml LV  SV MOD BP:     40.9 ml RIGHT VENTRICLE RV S prime:     10.40 cm/s TAPSE (M-mode): 2.5 cm LEFT ATRIUM              Index        RIGHT ATRIUM           Index LA diam:        4.10 cm  1.82 cm/m   RA Area:     20.10 cm LA Vol (A2C):   154.0 ml 68.34 ml/m  RA Volume:   63.00 ml  27.96 ml/m LA Vol (A4C):   89.8 ml  39.85 ml/m LA Biplane Vol: 119.0 ml 52.81 ml/m  AORTIC VALVE LVOT Vmax:   90.20 cm/s LVOT Vmean:  69.300 cm/s LVOT VTI:    0.145 m  AORTA Ao Root diam: 3.70 cm Ao Asc diam:  4.10 cm MITRAL VALVE               TRICUSPID VALVE MV Area (PHT): 4.29 cm    TR Peak grad:   27.9 mmHg MV Decel Time: 177 msec    TR Vmax:        264.00 cm/s MV E velocity: 96.40 cm/s MV A velocity: 29.90 cm/s  SHUNTS MV E/A ratio:  3.22        Systemic VTI:  0.14 m                            Systemic Diam: 2.10 cm Dalton McleanMD Electronically signed by Ezra Kanner Signature Date/Time: 10/16/2023/8:58:07 AM    Final    DG Chest 2 View Result Date: 10/15/2023 CLINICAL DATA:  Cough EXAM: CHEST - 2 VIEW COMPARISON:  12/20/2018 FINDINGS: Cardiac shadow is prominent but accentuated by the frontal technique. Mild vascular congestion is noted with mild interstitial edema. No sizable effusion is seen. No focal infiltrate is noted. IMPRESSION: Changes consistent with CHF and mild edema. Electronically Signed   By: Oneil Devonshire M.D.   On: 10/15/2023 22:30     Medications:     Current Medications:  furosemide   40 mg Intravenous BID   heparin   5,000 Units Subcutaneous Q8H   isosorbide -hydrALAZINE   2 tablet Oral TID    Infusions:     Patient Profile   Mr Stults is a 37 year old with a history of HIV on cabenuva  and CKD Stage IIIb. Followed by ID. Has not been seen by PCP/Nephrology. No previous cardiac history. He does not drink alcohol or smoke. His father has kidney disease and HTN. No family history of SCD.   Admitted with hypertensive emergency and acute HFrEF.   Assessment/Plan   1. Acute HFrEF Etiology unknown but suspect  HTN. No previous cardiac history. Has history of HIV . Once diuresed will consider CMRI. Echo - severely reduced LVEF < 20% and mildly reduced RV.  NYHA III. Marked Volume overload. Increase lasix  80 mg  twice a day. Add ted hose.  GDMT limited due to CKD.  Start Bidil  2 tab three times a day  Hold off on BB for now.   2. HTN Emergency  Continue to diurese and adding Bidil  as noted above. Co-pay for Bidil  $10.00  Hold off on BB for now.   3. AKI on CKD Stage IIIB Renal US - no hydronephrosis. Chronic medical renal disease No recent NSAIDs.  Would consult Nephrology.      Length of Stay: 0  Greig Mosses, NP  10/16/2023, 10:59 AM  Advanced Heart Failure Team Pager [phone] (M-F; 7a - 5p)  Please contact [facility] for night-coverage after hours (4p -7a ) and weekends on [email or web address]

## 2023-10-16 NOTE — Assessment & Plan Note (Addendum)
 Creatinine 3.11 up from baseline of 2.10 April 2023 Patient was referred to internal medicine several months prior but did not make it to the visit Monitor renal function in the setting of IV Lasix  Will get baseline urinalysis Nephrology consult in the a.m.

## 2023-10-16 NOTE — ED Notes (Signed)
Patient does not have a primary care physician.

## 2023-10-16 NOTE — Assessment & Plan Note (Addendum)
 History of hep C  and syphilis Receives Cabenuva injections through ID clinic(last shot 09/05/23, next due 10/21/2023)

## 2023-10-17 DIAGNOSIS — J810 Acute pulmonary edema: Secondary | ICD-10-CM | POA: Diagnosis not present

## 2023-10-17 DIAGNOSIS — N179 Acute kidney failure, unspecified: Secondary | ICD-10-CM | POA: Diagnosis not present

## 2023-10-17 DIAGNOSIS — I161 Hypertensive emergency: Secondary | ICD-10-CM | POA: Diagnosis not present

## 2023-10-17 DIAGNOSIS — B20 Human immunodeficiency virus [HIV] disease: Secondary | ICD-10-CM

## 2023-10-17 LAB — BASIC METABOLIC PANEL
Anion gap: 7 (ref 5–15)
BUN: 28 mg/dL — ABNORMAL HIGH (ref 6–20)
CO2: 26 mmol/L (ref 22–32)
Calcium: 8.2 mg/dL — ABNORMAL LOW (ref 8.9–10.3)
Chloride: 105 mmol/L (ref 98–111)
Creatinine, Ser: 3.38 mg/dL — ABNORMAL HIGH (ref 0.61–1.24)
GFR, Estimated: 23 mL/min — ABNORMAL LOW (ref >60)
Glucose, Bld: 96 mg/dL (ref 70–99)
Potassium: 3.5 mmol/L (ref 3.5–5.1)
Sodium: 138 mmol/L (ref 135–145)

## 2023-10-17 LAB — PROTEIN / CREATININE RATIO, URINE
Creatinine, Urine: 15 mg/dL
Protein Creatinine Ratio: 2.27 mg/mg{creat} — ABNORMAL HIGH (ref 0.00–0.15)
Total Protein, Urine: 34 mg/dL

## 2023-10-17 LAB — PHOSPHORUS: Phosphorus: 4.1 mg/dL (ref 2.5–4.6)

## 2023-10-17 NOTE — Progress Notes (Signed)
 [facility] Progress Note  Name: [name] MRN: [id number] DOB: 24-Oct-1985  Chief Complaint:  Shortness of breath and swelling  Subjective:  He had 2.0 liters UOP over 1/10.  He feels better.  He and I discussed his labs.  He asks about an estimate of how long he might be here and I let him know would ultimately defer to primary team and cardiology about that.    Review of systems:  Shortness of breath is much better Denies n/v Denies chest pain  -------------------- Background on consult:  [name] is a 38 y.o. male with a history of HIV, CKD, and ITP who presented to the hospital with a one week history of progressive shortness of breath with exertion and lower extremity swelling that had been worsening over the past couple of weeks.  He had noted foamy urine for a month.  He was found to have new heart failure with significant volume overload.  Baseline creatinine is between 2-1/2 and 3 he has no nephrologist and reported he was not aware of his kidney disease.  Nephrology is consulted for assistance with management of CKD in the setting of overload.   Here he was given lasix  40 mg IV once initially and this was increased to lasix  80 mg IV BID.  He had a TTE with LVEF less than 20% and grade II diastolic dysfunction.  There was mildly elevated pulmonary artery systolic pressure.  He was noted to have a small ASD as well with predominantly left to right shunting across the atrial septum.  Heart failure team is seeing him.  He had 3.9 liters UOP over 1/9 and has had 2.0 liters UOP charted thus far over 1/10.  Per charting he was referred to establish care with PCP but never did.  He is a little discouraged by everything going on.  He asked if the high blood pressure is from what he is eating.  We discussed diet would contribute but that genetics playing a factor, too.  His dad and his grandmother had HTN and his dad has CKD and states takes medication for the same.  He has  had HIV for over 10 years.  Previously was on biktarvy  and states he was changed (he thinks was in part due to CKD).  He states his ID MD has retired so he has a new provider.  He was seen most recently in 04/08/23 by Cameron Orem, MD in ID. His viral loads that I have located are undetectable and have been for some time.  He confirms HIV is well-controlled.     No intake or output data in the 24 hours ending 10/17/23 1704  Vitals:  Vitals:   10/17/23 0400 10/17/23 0500 10/17/23 0749 10/17/23 1200  BP: 136/87  (!) 159/113 (!) 172/120  Pulse: 94  (!) 109 (!) 108  Resp: (!) 25  (!) 22 16  Temp: 98.3 F (36.8 C)  97.9 F (36.6 C) 99.1 F (37.3 C)  TempSrc: Oral   Oral  SpO2: 96%  98% 94%  Weight:  110.4 kg    Height:         Physical Exam:  General adult male in bed in no acute distress HEENT normocephalic atraumatic extraocular movements intact sclera anicteric Neck supple trachea midline Lungs clear to auscultation bilaterally normal work of breathing at rest on room air  Heart S1S2 no rub Abdomen soft nontender nondistended Extremities 1+ edema with compression stockings on  Psych normal mood and affect Neuro -  alert and oriented x 3 provides hx and follows commands    Medications reviewed   Labs:     Latest Ref Rng & Units 10/17/2023    5:40 AM 10/16/2023    8:40 AM 10/15/2023   10:21 PM  BMP  Glucose 70 - 99 mg/dL 96  883  897   BUN 6 - 20 mg/dL 28  28  30    Creatinine 0.61 - 1.24 mg/dL 6.61  6.88  6.88   Sodium 135 - 145 mmol/L 138  139  136   Potassium 3.5 - 5.1 mmol/L 3.5  3.8  4.3   Chloride 98 - 111 mmol/L 105  107  107   CO2 22 - 32 mmol/L 26  23  21    Calcium 8.9 - 10.3 mg/dL 8.2  8.5  8.6      Assessment/Plan:    # Acute systolic congestive heart failure, severe - new diagnosis  - Per heart failure team is in the setting of uncontrolled HTN.   - Appreciate CHF team - Can continue lasix  80 mg IV BID - excellent response to diuretics.  Reassess  transition to PO soon    # CKD IV - Likely secondary to HTN.  His HIV is well-controlled so less of a factor.  He was previously on biktarvy  (contains tenofovir) and was changed in part due to renal function.   Up/cr ratio 2270 mg/g - Baseline Cr 2.5 - 3.0 - Agree with diuretics - Given the proteinuria we have checked ANA, ANCA, C3, C4 and anti-GBM - all pending.  Less likely etiology due to the chronicity.  Has worsened over several years and note patient is likely with significant scarring at this point  - I did discuss with him that he has no indication for dialysis acutely and that our goal would be to protect his residual renal function as well as to refer for pre-emptive kidney transplant when that was indicated if heart function improved to allow that - intact PTH is pending - Will need to avoid NSAID's     # Proteinuria - Given the proteinuria will check serologies as above  # HTN emergency  - Diuretics as above  - Improving  - He has been initiated on therapies per primary team and CHF   # HIV - Therapies per Infectious Disease.  Appreciate ID and ID pharmacy   Disposition - continue inpatient monitoring    Cameron JAYSON Saba, MD 10/17/2023 5:04 PM

## 2023-10-17 NOTE — Plan of Care (Signed)

## 2023-10-17 NOTE — Progress Notes (Signed)
 PROGRESS NOTE        PATIENT DETAILS Name: [name] Age: 38 y.o. Sex: male Date of Birth: Jun 01, 1986 Admit Date: 10/15/2023 Admitting Physician Delayne Cleatus GAILS, MD ERE:[id number], No Pcp Per  Brief Summary: Patient is a 38 y.o.  male with history of HIV on ART, CKD 4-presented with history of exertional dyspnea/lower extremity edema-he was found to have new diagnosis of decompensated systolic heart failure-hypertensive emergency-and superimposed AKI-he was subsequently admitted to the hospitalist service.  Significant events: 1/9>> admit to [facility]-hypertensive emergency/decompensated heart failure  Significant studies: 1/10>> renal ultrasound: No hydronephrosis 1/10>> echo: EF<20%, grade 2 diastolic dysfunction, reduced RV systolic function  Significant microbiology data: 1/9>> COVID/influenza/RSV PCR: Negative  Procedures: None  Consults: Advanced heart failure team Nephrology  Subjective: Lying comfortably in bed-denies any chest pain or shortness of breath.  Feels better-still with significant leg edema.  Objective: Vitals: Blood pressure (!) 159/113, pulse (!) 109, temperature 97.9 F (36.6 C), resp. rate (!) 22, height 6' 1 (1.854 m), weight 110.4 kg, SpO2 98%.   Exam: Gen Exam:Alert awake-not in any distress HEENT:atraumatic, normocephalic Chest: Mostly clear to auscultation except for a few bibasilar rales. CVS:S1S2 regular Abdomen:soft non tender, non distended Extremities:++ edema Neurology: Non focal Skin: no rash  Pertinent Labs/Radiology:    Latest Ref Rng & Units 10/15/2023   10:21 PM 04/02/2023    9:33 AM 04/02/2023    9:23 AM  CBC  WBC 4.0 - 10.5 K/uL 9.4   6.1   Hemoglobin 13.0 - 17.0 g/dL 85.8  85.3  85.3   Hematocrit 39.0 - 52.0 % 42.5  43.0  44.4   Platelets 150 - 400 K/uL 198   142     Lab Results  Component Value Date   NA 138 10/17/2023   K 3.5 10/17/2023   CL 105 10/17/2023   CO2 26 10/17/2023       Assessment/Plan: Acute systolic heart failure Unclear etiology-advanced heart failure team following Volume status gradually improving On IV Lasix  80 mg twice daily and Bidil   Hypertensive emergency BP better controlled-see below  AKI on CKD 4 Mild worsening of creatinine today-likely due to better BP control Unclear etiology for CKD 4-probably hypertensive nephropathy-HIV well-controlled-not felt to have HIV nephropathy-awaiting urine protein creatinine ratio and autoimmune workup Nephrology following.  HTN BP is now stable on BiDil   HIV Last CD4 count 509 on February 2028 On [place]  Injections every 2 months Follows with [facility]  History of latent syphilis Appears to have gotten benzathine penicillin  March 2024 at [facility]  Class 1 Obesity: Estimated body mass index is 32.11 kg/m as calculated from the following:   Height as of this encounter: 6' 1 (1.854 m).   Weight as of this encounter: 110.4 kg.   Code status:   Code Status: Full Code   DVT Prophylaxis: Place TED hose Start: 10/16/23 1103 heparin  injection 5,000 Units Start: 10/16/23 0600    Family Communication: None at bedside   Disposition Plan: Status is: Inpatient Remains inpatient appropriate because: Severity of illness   Planned Discharge Destination:Home   Diet: Diet Order             Diet 2 gram sodium Room service appropriate? Yes; Fluid consistency: Thin  Diet effective now  Antimicrobial agents: Anti-infectives (From admission, onward)    None        MEDICATIONS: Scheduled Meds:  furosemide   80 mg Intravenous BID   heparin   5,000 Units Subcutaneous Q8H   isosorbide -hydrALAZINE   2 tablet Oral TID   Continuous Infusions: PRN Meds:.acetaminophen  **OR** acetaminophen , guaiFENesin , ondansetron  **OR** ondansetron  (ZOFRAN ) IV   I have personally reviewed following labs and imaging studies  LABORATORY DATA: CBC: Recent Labs  Lab 10/15/23 2221   WBC 9.4  HGB 14.1  HCT 42.5  MCV 88.5  PLT 198    Basic Metabolic Panel: Recent Labs  Lab 10/15/23 2221 10/16/23 0840 10/17/23 0540  NA 136 139 138  K 4.3 3.8 3.5  CL 107 107 105  CO2 21* 23 26  GLUCOSE 102* 116* 96  BUN 30* 28* 28*  CREATININE 3.11* 3.11* 3.38*  CALCIUM 8.6* 8.5* 8.2*  PHOS  --   --  4.1    GFR: Estimated Creatinine Clearance: 38.2 mL/min (A) (by C-G formula based on SCr of 3.38 mg/dL (H)).  Liver Function Tests: Recent Labs  Lab 10/16/23 0210  AST 50*  ALT 40  ALKPHOS 94  BILITOT 1.3*  PROT 5.8*  ALBUMIN  2.5*   No results for input(s): LIPASE, AMYLASE in the last 168 hours. No results for input(s): AMMONIA in the last 168 hours.  Coagulation Profile: No results for input(s): INR, PROTIME in the last 168 hours.  Cardiac Enzymes: No results for input(s): CKTOTAL, CKMB, CKMBINDEX, TROPONINI in the last 168 hours.  BNP (last 3 results) No results for input(s): PROBNP in the last 8760 hours.  Lipid Profile: No results for input(s): CHOL, HDL, LDLCALC, TRIG, CHOLHDL, LDLDIRECT in the last 72 hours.  Thyroid Function Tests: Recent Labs    10/16/23 0840  TSH 1.384    Anemia Panel: No results for input(s): VITAMINB12, FOLATE, FERRITIN, TIBC, IRON , RETICCTPCT in the last 72 hours.  Urine analysis:    Component Value Date/Time   COLORURINE COLORLESS (A) 10/16/2023 0210   APPEARANCEUR CLEAR 10/16/2023 0210   LABSPEC 1.004 (L) 10/16/2023 0210   PHURINE 6.0 10/16/2023 0210   GLUCOSEU NEGATIVE 10/16/2023 0210   HGBUR SMALL (A) 10/16/2023 0210   BILIRUBINUR NEGATIVE 10/16/2023 0210   KETONESUR NEGATIVE 10/16/2023 0210   PROTEINUR 100 (A) 10/16/2023 0210   NITRITE NEGATIVE 10/16/2023 0210   LEUKOCYTESUR NEGATIVE 10/16/2023 0210    Sepsis Labs: Lactic Acid, Venous No results found for: LATICACIDVEN  MICROBIOLOGY: Recent Results (from the past 240 hours)  Resp panel by RT-PCR (RSV, Flu  A&B, Covid) Anterior Nasal Swab     Status: None   Collection Time: 10/15/23 10:14 PM   Specimen: Anterior Nasal Swab  Result Value Ref Range Status   SARS Coronavirus 2 by RT PCR NEGATIVE NEGATIVE Final   Influenza A by PCR NEGATIVE NEGATIVE Final   Influenza B by PCR NEGATIVE NEGATIVE Final    Comment: (NOTE) The Xpert Xpress SARS-CoV-2/FLU/RSV plus assay is intended as an aid in the diagnosis of influenza from Nasopharyngeal swab specimens and should not be used as a sole basis for treatment. Nasal washings and aspirates are unacceptable for Xpert Xpress SARS-CoV-2/FLU/RSV testing.  Fact Sheet for Patients: bloggercourse.com  Fact Sheet for Healthcare Providers: seriousbroker.it  This test is not yet approved or cleared by the [place]  FDA and has been authorized for detection and/or diagnosis of SARS-CoV-2 by FDA under an Emergency Use Authorization (EUA). This EUA will remain in effect (meaning this test can be used) for  the duration of the COVID-19 declaration under Section 564(b)(1) of the Act, 21 U.S.C. section 360bbb-3(b)(1), unless the authorization is terminated or revoked.     Resp Syncytial Virus by PCR NEGATIVE NEGATIVE Final    Comment: (NOTE) Fact Sheet for Patients: bloggercourse.com  Fact Sheet for Healthcare Providers: seriousbroker.it  This test is not yet approved or cleared by the [place]  FDA and has been authorized for detection and/or diagnosis of SARS-CoV-2 by FDA under an Emergency Use Authorization (EUA). This EUA will remain in effect (meaning this test can be used) for the duration of the COVID-19 declaration under Section 564(b)(1) of the Act, 21 U.S.C. section 360bbb-3(b)(1), unless the authorization is terminated or revoked.  Performed at [facility], [address], [place], [place] [address]     RADIOLOGY  STUDIES/RESULTS: US  RENAL Result Date: 10/16/2023 CLINICAL DATA:  Acute renal failure EXAM: RENAL / URINARY TRACT ULTRASOUND COMPLETE COMPARISON:  None Available. FINDINGS: Right Kidney: Renal measurements: 10.1 x 3.7 x 5.5 cm = volume: 107.6 mL. Increased echogenicity. No mass or hydronephrosis visualized. Left Kidney: Renal measurements: 9.7 x 4.3 x 4.1 cm = volume: 90.7 mL. Increased echogenicity. No mass or hydronephrosis visualized. Bladder: Appears normal for degree of bladder distention. Other: None. IMPRESSION: 1. No hydronephrosis. 2. Increased renal echogenicity as can be seen with chronic medical renal disease. Electronically Signed   By: Bard Moats M.D.   On: 10/16/2023 09:20   ECHOCARDIOGRAM COMPLETE Result Date: 10/16/2023    ECHOCARDIOGRAM REPORT   Patient Name:   HILDRETH ROBART Date of Exam: 10/16/2023 Medical Rec #:  [id number]     Height:       73.0 in Accession #:    [id number]    Weight:       223.0 lb Date of Birth:  05/19/1986     BSA:          2.254 m Patient Age:    37 years      BP:           164/123 mmHg Patient Gender: M             HR:           97 bpm. Exam Location:  Inpatient Procedure: 2D Echo, Color Doppler, Cardiac Doppler and Intracardiac            Opacification Agent Indications:    I50.31 Acute diastolic (congestive) heart failure  History:        Patient has no prior history of Echocardiogram examinations.  Sonographer:    Damien Senior RDCS Referring Phys: [id number] DELAYNE LULLA SOLIAN IMPRESSIONS  1. Prominent LV trabeculations without evidence of thrombus with contrast. Left ventricular ejection fraction, by estimation, is <20%. Left ventricular ejection fraction by 2D MOD biplane is 18.5 %. The left ventricle has severely decreased function. The left ventricle demonstrates global hypokinesis. The left ventricular internal cavity size was moderately dilated. There is mild concentric left ventricular hypertrophy. Left ventricular diastolic parameters are consistent with Grade II  diastolic dysfunction (pseudonormalization).  2. Right ventricular systolic function is mildly reduced. The right ventricular size is mildly enlarged. There is mildly elevated pulmonary artery systolic pressure. The estimated right ventricular systolic pressure is 42.9 mmHg.  3. The mitral valve is normal in structure. Mild mitral valve regurgitation. No evidence of mitral stenosis.  4. The aortic valve is tricuspid. Aortic valve regurgitation is not visualized. No aortic stenosis is present.  5. There is mild dilatation of the ascending aorta,  measuring 41 mm.  6. The inferior vena cava is dilated in size with <50% respiratory variability, suggesting right atrial pressure of 15 mmHg.  7. Evidence of atrial level shunting detected by color flow Doppler. There is a small atrial septal defect with predominantly left to right shunting across the atrial septum. FINDINGS  Left Ventricle: Prominent LV trabeculations without evidence of thrombus with contrast. Left ventricular ejection fraction, by estimation, is <20%. Left ventricular ejection fraction by 2D MOD biplane is 18.5 %. The left ventricle has severely decreased  function. The left ventricle demonstrates global hypokinesis. Definity  contrast agent was given IV to delineate the left ventricular endocardial borders. The left ventricular internal cavity size was moderately dilated. There is mild concentric left ventricular hypertrophy. Left ventricular diastolic parameters are consistent with Grade II diastolic dysfunction (pseudonormalization). Right Ventricle: The right ventricular size is mildly enlarged. No increase in right ventricular wall thickness. Right ventricular systolic function is mildly reduced. There is mildly elevated pulmonary artery systolic pressure. The tricuspid regurgitant  velocity is 2.64 m/s, and with an assumed right atrial pressure of 15 mmHg, the estimated right ventricular systolic pressure is 42.9 mmHg. Left Atrium: Left atrial size  was normal in size. Right Atrium: Right atrial size was normal in size. Pericardium: There is no evidence of pericardial effusion. Mitral Valve: The mitral valve is normal in structure. Mild mitral valve regurgitation. No evidence of mitral valve stenosis. Tricuspid Valve: The tricuspid valve is normal in structure. Tricuspid valve regurgitation is mild. Aortic Valve: The aortic valve is tricuspid. Aortic valve regurgitation is not visualized. No aortic stenosis is present. Pulmonic Valve: The pulmonic valve was normal in structure. Pulmonic valve regurgitation is trivial. Aorta: The aortic root is normal in size and structure. There is mild dilatation of the ascending aorta, measuring 41 mm. Venous: The inferior vena cava is dilated in size with less than 50% respiratory variability, suggesting right atrial pressure of 15 mmHg. IAS/Shunts: Evidence of atrial level shunting detected by color flow Doppler. There is a small atrial septal defect with predominantly left to right shunting across the atrial septum.  LEFT VENTRICLE PLAX 2D                        Biplane EF (MOD) LVIDd:         6.50 cm         LV Biplane EF:   Left LVIDs:         5.70 cm                          ventricular LV PW:         1.10 cm                          ejection LV IVS:        1.10 cm                          fraction by LVOT diam:     2.10 cm                          2D MOD LV SV:         50  biplane is LV SV Index:   22                               18.5 %. LVOT Area:     3.46 cm                                Diastology                                LV e' medial:    9.36 cm/s LV Volumes (MOD)               LV E/e' medial:  10.3 LV vol d, MOD    242.0 ml      LV e' lateral:   8.16 cm/s A2C:                           LV E/e' lateral: 11.8 LV vol d, MOD    204.0 ml A4C: LV vol s, MOD    189.0 ml A2C: LV vol s, MOD    170.0 ml A4C: LV SV MOD A2C:   53.0 ml LV SV MOD A4C:   204.0 ml LV SV MOD BP:    40.9 ml  RIGHT VENTRICLE RV S prime:     10.40 cm/s TAPSE (M-mode): 2.5 cm LEFT ATRIUM              Index        RIGHT ATRIUM           Index LA diam:        4.10 cm  1.82 cm/m   RA Area:     20.10 cm LA Vol (A2C):   154.0 ml 68.34 ml/m  RA Volume:   63.00 ml  27.96 ml/m LA Vol (A4C):   89.8 ml  39.85 ml/m LA Biplane Vol: 119.0 ml 52.81 ml/m  AORTIC VALVE LVOT Vmax:   90.20 cm/s LVOT Vmean:  69.300 cm/s LVOT VTI:    0.145 m  AORTA Ao Root diam: 3.70 cm Ao Asc diam:  4.10 cm MITRAL VALVE               TRICUSPID VALVE MV Area (PHT): 4.29 cm    TR Peak grad:   27.9 mmHg MV Decel Time: 177 msec    TR Vmax:        264.00 cm/s MV E velocity: 96.40 cm/s MV A velocity: 29.90 cm/s  SHUNTS MV E/A ratio:  3.22        Systemic VTI:  0.14 m                            Systemic Diam: 2.10 cm Dalton McleanMD Electronically signed by Ezra Kanner Signature Date/Time: 10/16/2023/8:58:07 AM    Final    DG Chest 2 View Result Date: 10/15/2023 CLINICAL DATA:  Cough EXAM: CHEST - 2 VIEW COMPARISON:  12/20/2018 FINDINGS: Cardiac shadow is prominent but accentuated by the frontal technique. Mild vascular congestion is noted with mild interstitial edema. No sizable effusion is seen. No focal infiltrate is noted. IMPRESSION: Changes consistent with CHF and mild edema. Electronically Signed   By: Oneil Devonshire M.D.   On: 10/15/2023 22:30     LOS:  1 day   Donalda Applebaum, MD  [facility]    To contact the attending provider between 7A-7P or the covering provider during after hours 7P-7A, please log into the web site [email or web address] and access using universal Joyce password for that web site. If you do not have the password, please call the hospital operator.  10/17/2023, 9:49 AM

## 2023-10-18 DIAGNOSIS — I5041 Acute combined systolic (congestive) and diastolic (congestive) heart failure: Secondary | ICD-10-CM | POA: Diagnosis not present

## 2023-10-18 LAB — BASIC METABOLIC PANEL
Anion gap: 9 (ref 5–15)
BUN: 23 mg/dL — ABNORMAL HIGH (ref 6–20)
CO2: 25 mmol/L (ref 22–32)
Calcium: 8.2 mg/dL — ABNORMAL LOW (ref 8.9–10.3)
Chloride: 102 mmol/L (ref 98–111)
Creatinine, Ser: 3.36 mg/dL — ABNORMAL HIGH (ref 0.61–1.24)
GFR, Estimated: 23 mL/min — ABNORMAL LOW (ref >60)
Glucose, Bld: 101 mg/dL — ABNORMAL HIGH (ref 70–99)
Potassium: 3.7 mmol/L (ref 3.5–5.1)
Sodium: 136 mmol/L (ref 135–145)

## 2023-10-18 LAB — MAGNESIUM: Magnesium: 1.3 mg/dL — ABNORMAL LOW (ref 1.7–2.4)

## 2023-10-18 LAB — PHOSPHORUS: Phosphorus: 4.2 mg/dL (ref 2.5–4.6)

## 2023-10-18 LAB — C3 COMPLEMENT: C3 Complement: 104 mg/dL (ref 82–167)

## 2023-10-18 LAB — PARATHYROID HORMONE, INTACT (NO CA): PTH: 153 pg/mL — ABNORMAL HIGH (ref 15–65)

## 2023-10-18 LAB — C4 COMPLEMENT: Complement C4, Body Fluid: 19 mg/dL (ref 12–38)

## 2023-10-18 MED ORDER — ISOSORBIDE MONONITRATE ER 60 MG PO TB24
60.0000 mg | ORAL_TABLET | Freq: Every day | ORAL | Status: DC
  Administered 2023-10-18 – 2023-10-20 (×3): 60 mg via ORAL
  Filled 2023-10-18 (×3): qty 1

## 2023-10-18 MED ORDER — FUROSEMIDE 40 MG PO TABS
80.0000 mg | ORAL_TABLET | Freq: Every day | ORAL | Status: DC
  Administered 2023-10-19: 80 mg via ORAL
  Filled 2023-10-18: qty 2

## 2023-10-18 MED ORDER — DOCUSATE SODIUM 100 MG PO CAPS
200.0000 mg | ORAL_CAPSULE | Freq: Two times a day (BID) | ORAL | Status: DC
  Administered 2023-10-19: 200 mg via ORAL
  Filled 2023-10-18 (×4): qty 2

## 2023-10-18 MED ORDER — LACTULOSE 10 GM/15ML PO SOLN
30.0000 g | Freq: Two times a day (BID) | ORAL | Status: AC
  Administered 2023-10-18 – 2023-10-19 (×2): 30 g via ORAL
  Filled 2023-10-18 (×2): qty 60

## 2023-10-18 MED ORDER — HYDRALAZINE HCL 50 MG PO TABS
100.0000 mg | ORAL_TABLET | Freq: Three times a day (TID) | ORAL | Status: DC
  Administered 2023-10-18 – 2023-10-20 (×5): 100 mg via ORAL
  Filled 2023-10-18 (×6): qty 2

## 2023-10-18 MED ORDER — MAGNESIUM SULFATE 4 GM/100ML IV SOLN
4.0000 g | Freq: Once | INTRAVENOUS | Status: AC
  Administered 2023-10-18: 4 g via INTRAVENOUS
  Filled 2023-10-18: qty 100

## 2023-10-18 NOTE — Progress Notes (Signed)
 [facility] Progress Note  Name: [name] MRN: [id number] DOB: [birth year]  Chief Complaint:  Shortness of breath and swelling  Subjective:  He had no ins/outs recorded over 1/11.  He has had 550 mL as well as one unmeasured urine void over 1/11. He feels ok and feels like his breathing is back to normal.    Review of systems:   Shortness of breath is resolved per patient  Denies n/v Denies chest pain  -------------------- Background on consult:  [name] is a 38 y.o. male with a history of HIV, CKD, and ITP who presented to the hospital with a one week history of progressive shortness of breath with exertion and lower extremity swelling that had been worsening over the past couple of weeks.  He had noted foamy urine for a month.  He was found to have new heart failure with significant volume overload.  Baseline creatinine is between 2-1/2 and 3 he has no nephrologist and reported he was not aware of his kidney disease.  Nephrology is consulted for assistance with management of CKD in the setting of overload.   Here he was given lasix  40 mg IV once initially and this was increased to lasix  80 mg IV BID.  He had a TTE with LVEF less than 20% and grade II diastolic dysfunction.  There was mildly elevated pulmonary artery systolic pressure.  He was noted to have a small ASD as well with predominantly left to right shunting across the atrial septum.  Heart failure team is seeing him.  He had 3.9 liters UOP over 1/9 and has had 2.0 liters UOP charted thus far over 1/10.  Per charting he was referred to establish care with PCP but never did.  He is a little discouraged by everything going on.  He asked if the high blood pressure is from what he is eating.  We discussed diet would contribute but that genetics playing a factor, too.  His dad and his grandmother had HTN and his dad has CKD and states takes medication for the same.  He has had HIV for over 10 years.  Previously was on  biktarvy  and states he was changed (he thinks was in part due to CKD).  He states his ID MD has retired so he has a new provider.  He was seen most recently in 04/08/23 by Annalee Orem, MD in ID. His viral loads that I have located are undetectable and have been for some time.  He confirms HIV is well-controlled.      Intake/Output Summary (Last 24 hours) at 10/18/2023 1447 Last data filed at 10/18/2023 1233 Gross per 24 hour  Intake --  Output 550 ml  Net -550 ml    Vitals:  Vitals:   10/18/23 0538 10/18/23 0903 10/18/23 1227 10/18/23 1228  BP: (!) 160/130 (!) 160/107  (!) 141/103  Pulse: 100 (!) 114    Resp:  16  16  Temp:  98.8 F (37.1 C) 99.2 F (37.3 C)   TempSrc:  Oral Oral   SpO2: 96% 95%  95%  Weight:      Height:         Physical Exam:   General adult male in bed in no acute distress HEENT normocephalic atraumatic extraocular movements intact sclera anicteric Neck supple trachea midline Lungs clear to auscultation bilaterally normal work of breathing at rest on room air  Heart S1S2 no rub Abdomen soft nontender nondistended Extremities trace edema with compression stockings on  Psych normal mood and affect Neuro - alert and oriented x 3 provides hx and follows commands    Medications reviewed   Labs:     Latest Ref Rng & Units 10/18/2023    6:04 AM 10/17/2023    5:40 AM 10/16/2023    8:40 AM  BMP  Glucose 70 - 99 mg/dL 898  96  883   BUN 6 - 20 mg/dL 23  28  28    Creatinine 0.61 - 1.24 mg/dL 6.63  6.61  6.88   Sodium 135 - 145 mmol/L 136  138  139   Potassium 3.5 - 5.1 mmol/L 3.7  3.5  3.8   Chloride 98 - 111 mmol/L 102  105  107   CO2 22 - 32 mmol/L 25  26  23    Calcium 8.9 - 10.3 mg/dL 8.2  8.2  8.5      Assessment/Plan:    # Acute systolic congestive heart failure, severe - new diagnosis  - Per heart failure team is in the setting of uncontrolled HTN.   - Appreciate CHF team and primary team  - He is getting one more IV dose tonight then  transition to lasix  80 mg PO daily to start tomorrow    # CKD IV - Likely secondary to HTN.  His HIV is well-controlled so less of a factor.  He was previously on biktarvy  (contains tenofovir) and was changed in part due to renal function.   Up/cr ratio 2270 mg/g - Baseline Cr 2.5 - 3.0 - Agree with diuretics - Given the proteinuria we have checked ANA, ANCA, C3, C4 and anti-GBM - all pending.  Less likely etiology due to the chronicity.  Has worsened over several years and note patient is likely with significant scarring at this point  - I did discuss with him that he has no indication for dialysis acutely and that our goal would be to protect his residual renal function as well as to refer for pre-emptive kidney transplant when that was indicated if heart function improved to allow that - intact PTH is pending   - Will need to avoid NSAID's  - I will request hospital follow-up with me or an extender at [facility] in two weeks    # Proteinuria - Given the proteinuria will check serologies as above  # HTN emergency  - Diuretics as above  - Improving  - He has been initiated on therapies per primary team and CHF   # HIV - Therapies per Infectious Disease.  Appreciate ID and ID pharmacy   # Hypomagnesemia - s/p repletion   Disposition - continue inpatient monitoring    Katheryn JAYSON Saba, MD 10/18/2023 3:19 PM

## 2023-10-18 NOTE — Plan of Care (Signed)

## 2023-10-18 NOTE — Progress Notes (Signed)
 PROGRESS NOTE        PATIENT DETAILS Name: [name] Age: 38 y.o. Sex: male Date of Birth: [birth year] Admit Date: 10/15/2023 Admitting Physician Delayne Cleatus GAILS, MD ERE:Ejupzwu, No Pcp Per  Brief Summary: Patient is a 38 y.o.  male with history of HIV on ART, CKD 4-presented with history of exertional dyspnea/lower extremity edema-he was found to have new diagnosis of decompensated systolic heart failure-hypertensive emergency-and superimposed AKI-he was subsequently admitted to the hospitalist service.  Significant events: 1/9>> admit to [facility]-hypertensive emergency/decompensated heart failure  Significant studies: 1/10>> renal ultrasound: No hydronephrosis 1/10>> echo: EF<20%, grade 2 diastolic dysfunction, reduced RV systolic function  Significant microbiology data: 1/9>> COVID/influenza/RSV PCR: Negative  Procedures: None  Consults: Advanced heart failure team Nephrology  Subjective: Patient in bed, appears comfortable, denies any headache, no fever, no chest pain or pressure, no shortness of breath , no abdominal pain. No focal weakness. Objective: Vitals: Blood pressure (!) 160/107, pulse (!) 114, temperature 98.8 F (37.1 C), temperature source Oral, resp. rate 16, height 6' 1 (1.854 m), weight 103.8 kg, SpO2 95%.   Exam:  Awake Alert, No new F.N deficits, Normal affect [place].AT,PERRAL Supple Neck, No JVD,   Symmetrical Chest wall movement, Good air movement bilaterally, bibasilar crackles RRR,No Gallops, Rubs or new Murmurs,  +ve B.Sounds, Abd Soft, No tenderness,   1+ lower extremity edema   Assessment/Plan: Acute systolic heart failure Unclear etiology-advanced heart failure team following Volume status gradually improving On IV Lasix  80 mg twice daily since blood pressure is poorly controlled have switched BiDil  to moderate dose Imdur  and high-dose hydralazine  3 times a day  Hypertensive emergency Blood pressure high medications  adjusted as dictated above  AKI on CKD 4 18 seems to be stabilizing around 3.5 Unclear etiology for CKD 4-probably hypertensive nephropathy-HIV well-controlled-not felt to have HIV nephropathy-awaiting urine protein creatinine ratio and autoimmune workup Nephrology following.  HTN BP is quite high switch to Imdur  and hydralazine  from BiDil  to provide higher doses of both.  HIV Last CD4 count 509 on February 2028 On Cabenuva  Injections every 2 months [facility]  History of latent syphilis Appears to have gotten benzathine penicillin  March 2024 at [facility]  Class 1 Obesity: Estimated body mass index is 30.19 kg/m as calculated from the following:   Height as of this encounter: 6' 1 (1.854 m).   Weight as of this encounter: 103.8 kg.   Code status:   Code Status: Full Code   DVT Prophylaxis: Place TED hose Start: 10/16/23 1103 heparin  injection 5,000 Units Start: 10/16/23 0600    Family Communication: None at bedside   Disposition Plan: Status is: Inpatient Remains inpatient appropriate because: Severity of illness   Planned Discharge Destination:Home   Diet: Diet Order             Diet 2 gram sodium Room service appropriate? Yes; Fluid consistency: Thin  Diet effective now                     Antimicrobial agents: Anti-infectives (From admission, onward)    None        MEDICATIONS: Scheduled Meds:  furosemide   80 mg Intravenous BID   heparin   5,000 Units Subcutaneous Q8H   hydrALAZINE   100 mg Oral Q8H   isosorbide  mononitrate  60 mg Oral Daily   Continuous Infusions:  magnesium  sulfate bolus IVPB 4 g (10/18/23 1003)   PRN Meds:.acetaminophen  **OR** acetaminophen , guaiFENesin , ondansetron  **OR** ondansetron  (ZOFRAN ) IV   I have personally reviewed following labs and imaging studies  LABORATORY DATA:  Recent Labs  Lab 10/15/23 2221  WBC 9.4  HGB 14.1  HCT 42.5  PLT 198  MCV 88.5  MCH 29.4  MCHC 33.2  RDW 13.3     Recent Labs  Lab 10/15/23 2221 10/16/23 0210 10/16/23 0840 10/17/23 0540 10/18/23 0604  NA 136  --  139 138 136  K 4.3  --  3.8 3.5 3.7  CL 107  --  107 105 102  CO2 21*  --  23 26 25   ANIONGAP 8  --  9 7 9   GLUCOSE 102*  --  116* 96 101*  BUN 30*  --  28* 28* 23*  CREATININE 3.11*  --  3.11* 3.38* 3.36*  AST  --  50*  --   --   --   ALT  --  40  --   --   --   ALKPHOS  --  94  --   --   --   BILITOT  --  1.3*  --   --   --   ALBUMIN   --  2.5*  --   --   --   PROCALCITON  --  <0.10  --   --   --   TSH  --   --  1.384  --   --   BNP 1,864.1*  --   --   --   --   MG  --   --   --   --  1.3*  PHOS  --   --   --  4.1 4.2  CALCIUM 8.6*  --  8.5* 8.2* 8.2*    Lab Results  Component Value Date   CHOL 202 (H) 04/08/2023   HDL 47 04/08/2023   LDLCALC 131 (H) 04/08/2023   TRIG 126 04/08/2023   CHOLHDL 4.3 04/08/2023      Recent Labs  Lab 10/15/23 2221 10/16/23 0210 10/16/23 0840 10/17/23 0540 10/18/23 0604  PROCALCITON  --  <0.10  --   --   --   TSH  --   --  1.384  --   --   BNP 1,864.1*  --   --   --   --   MG  --   --   --   --  1.3*  CALCIUM 8.6*  --  8.5* 8.2* 8.2*     RADIOLOGY STUDIES/RESULTS: No results found.    LOS: 2 days   Signature  -    Lavada Stank M.D on 10/18/2023 at 10:16 AM   -  To page go to [email or web address]

## 2023-10-18 NOTE — Plan of Care (Signed)
  Problem: Education: Goal: Knowledge of General Education information will improve Description: Including pain rating scale, medication(s)/side effects and non-pharmacologic comfort measures Outcome: Progressing   Problem: Health Behavior/Discharge Planning: Goal: Ability to manage health-related needs will improve Outcome: Progressing   Problem: Clinical Measurements: Goal: Will remain free from infection Outcome: Progressing Goal: Respiratory complications will improve Outcome: Progressing Goal: Cardiovascular complication will be avoided Outcome: Progressing   Problem: Activity: Goal: Risk for activity intolerance will decrease Outcome: Progressing   Problem: Nutrition: Goal: Adequate nutrition will be maintained Outcome: Progressing   Problem: Coping: Goal: Level of anxiety will decrease Outcome: Progressing

## 2023-10-19 ENCOUNTER — Encounter (HOSPITAL_COMMUNITY)
Admission: EM | Disposition: A | Discharge status: Home / Self Care | Payer: Self-pay | Source: Home / Self Care | Attending: Internal Medicine

## 2023-10-19 DIAGNOSIS — I5041 Acute combined systolic (congestive) and diastolic (congestive) heart failure: Secondary | ICD-10-CM | POA: Diagnosis not present

## 2023-10-19 DIAGNOSIS — I509 Heart failure, unspecified: Secondary | ICD-10-CM

## 2023-10-19 DIAGNOSIS — J810 Acute pulmonary edema: Secondary | ICD-10-CM

## 2023-10-19 HISTORY — PX: RIGHT HEART CATH: CATH118263

## 2023-10-19 LAB — BASIC METABOLIC PANEL
Anion gap: 11 (ref 5–15)
BUN: 23 mg/dL — ABNORMAL HIGH (ref 6–20)
CO2: 27 mmol/L (ref 22–32)
Calcium: 8.6 mg/dL — ABNORMAL LOW (ref 8.9–10.3)
Chloride: 97 mmol/L — ABNORMAL LOW (ref 98–111)
Creatinine, Ser: 3.51 mg/dL — ABNORMAL HIGH (ref 0.61–1.24)
GFR, Estimated: 22 mL/min — ABNORMAL LOW (ref >60)
Glucose, Bld: 99 mg/dL (ref 70–99)
Potassium: 3.6 mmol/L (ref 3.5–5.1)
Sodium: 135 mmol/L (ref 135–145)

## 2023-10-19 LAB — POCT I-STAT EG7
Acid-Base Excess: 5 mmol/L — ABNORMAL HIGH (ref 0.0–2.0)
Acid-Base Excess: 5 mmol/L — ABNORMAL HIGH (ref 0.0–2.0)
Bicarbonate: 29.6 mmol/L — ABNORMAL HIGH (ref 20.0–28.0)
Bicarbonate: 29.7 mmol/L — ABNORMAL HIGH (ref 20.0–28.0)
Calcium, Ion: 1.11 mmol/L — ABNORMAL LOW (ref 1.15–1.40)
Calcium, Ion: 1.12 mmol/L — ABNORMAL LOW (ref 1.15–1.40)
HCT: 42 % (ref 39.0–52.0)
HCT: 42 % (ref 39.0–52.0)
Hemoglobin: 14.3 g/dL (ref 13.0–17.0)
Hemoglobin: 14.3 g/dL (ref 13.0–17.0)
O2 Saturation: 66 %
O2 Saturation: 69 %
Potassium: 3.9 mmol/L (ref 3.5–5.1)
Potassium: 3.9 mmol/L (ref 3.5–5.1)
Sodium: 135 mmol/L (ref 135–145)
Sodium: 136 mmol/L (ref 135–145)
TCO2: 31 mmol/L (ref 22–32)
TCO2: 31 mmol/L (ref 22–32)
pCO2, Ven: 43.9 mm[Hg] — ABNORMAL LOW (ref 44–60)
pCO2, Ven: 44.4 mm[Hg] (ref 44–60)
pH, Ven: 7.433 — ABNORMAL HIGH (ref 7.25–7.43)
pH, Ven: 7.436 — ABNORMAL HIGH (ref 7.25–7.43)
pO2, Ven: 33 mm[Hg] (ref 32–45)
pO2, Ven: 35 mm[Hg] (ref 32–45)

## 2023-10-19 LAB — ANCA PROFILE
Anti-MPO Antibodies: <0.2 U (ref 0.0–0.9)
Anti-PR3 Antibodies: <0.2 U (ref 0.0–0.9)
Atypical P-ANCA titer: <1:20 {titer}
C-ANCA: <1:20 {titer}
P-ANCA: <1:20 {titer}

## 2023-10-19 LAB — GLOMERULAR BASEMENT MEMBRANE ANTIBODIES: GBM Ab: <0.2 U (ref 0.0–0.9)

## 2023-10-19 LAB — PHOSPHORUS: Phosphorus: 4.8 mg/dL — ABNORMAL HIGH (ref 2.5–4.6)

## 2023-10-19 LAB — MAGNESIUM: Magnesium: 1.9 mg/dL (ref 1.7–2.4)

## 2023-10-19 SURGERY — RIGHT HEART CATH
Anesthesia: LOCAL

## 2023-10-19 MED ORDER — HEPARIN (PORCINE) IN NACL 1000-0.9 UT/500ML-% IV SOLN
INTRAVENOUS | Status: DC | PRN
  Administered 2023-10-19: 500 mL

## 2023-10-19 MED ORDER — LIDOCAINE HCL (PF) 1 % IJ SOLN
INTRAMUSCULAR | Status: DC | PRN
  Administered 2023-10-19: 2 mL

## 2023-10-19 MED ORDER — SODIUM CHLORIDE 0.9 % IV SOLN: 250.0000 mL | INTRAVENOUS | Status: DC | PRN

## 2023-10-19 MED ORDER — LIVING BETTER WITH HEART FAILURE BOOK: Freq: Once | Status: DC

## 2023-10-19 MED ORDER — SODIUM CHLORIDE 0.9% FLUSH
3.0000 mL | Freq: Two times a day (BID) | INTRAVENOUS | Status: DC
  Administered 2023-10-19 – 2023-10-20 (×2): 3 mL via INTRAVENOUS

## 2023-10-19 MED ORDER — DOXAZOSIN MESYLATE 4 MG PO TABS
4.0000 mg | ORAL_TABLET | Freq: Every day | ORAL | Status: DC
  Administered 2023-10-19 – 2023-10-20 (×2): 4 mg via ORAL
  Filled 2023-10-19 (×2): qty 1

## 2023-10-19 MED ORDER — SODIUM CHLORIDE 0.9% FLUSH: 3.0000 mL | INTRAVENOUS | Status: DC | PRN

## 2023-10-19 MED ORDER — POTASSIUM CHLORIDE CRYS ER 20 MEQ PO TBCR
40.0000 meq | EXTENDED_RELEASE_TABLET | Freq: Once | ORAL | Status: AC
  Administered 2023-10-19: 40 meq via ORAL
  Filled 2023-10-19: qty 2

## 2023-10-19 MED ORDER — SODIUM CHLORIDE 0.9 % IV SOLN: INTRAVENOUS | Status: DC

## 2023-10-19 MED ORDER — AMLODIPINE BESYLATE 10 MG PO TABS
10.0000 mg | ORAL_TABLET | Freq: Every day | ORAL | Status: DC
  Administered 2023-10-19 – 2023-10-20 (×2): 10 mg via ORAL
  Filled 2023-10-19 (×2): qty 1

## 2023-10-19 MED ORDER — LIDOCAINE HCL (PF) 1 % IJ SOLN
INTRAMUSCULAR | Status: AC
  Filled 2023-10-19: qty 30

## 2023-10-19 MED ORDER — MAGNESIUM SULFATE IN D5W 1-5 GM/100ML-% IV SOLN
1.0000 g | Freq: Once | INTRAVENOUS | Status: AC
  Administered 2023-10-19: 1 g via INTRAVENOUS
  Filled 2023-10-19: qty 100

## 2023-10-19 SURGICAL SUPPLY — 6 items
CATH BALLN WEDGE 5F 110CM (CATHETERS) IMPLANT
GUIDEWIRE .025 260CM (WIRE) IMPLANT
PACK CARDIAC CATHETERIZATION (CUSTOM PROCEDURE TRAY) ×1 IMPLANT
SHEATH GLIDE SLENDER 4/5FR (SHEATH) IMPLANT
TRANSDUCER W/STOPCOCK (MISCELLANEOUS) IMPLANT
TUBING ART PRESS 72 MALE/FEM (TUBING) IMPLANT

## 2023-10-19 NOTE — Progress Notes (Addendum)
 Brief nephrology note:  Patient is at Cath Lab for right heart cath therefore unable to see and examined the patient today.  I have reviewed the chart.  Noted the IV diuretics/Lasix  was sent to p.o.  Patient has increased urine output and responded with IV diuretics yesterday.  Monitor renal parameters.  Further guidance of diuretics as per cath resolved.  We will see the patient tomorrow Please call us  back with question.  Mateo Romney, [facility].

## 2023-10-19 NOTE — TOC Initial Note (Signed)
 Transition of Care [facility]) - Initial/Assessment Note    Patient Details  Name: [name] MRN: [id number] Date of Birth: March 23, 1986  Transition of [facility]) CM/SW Contact:    Arlana JINNY Nicholaus [place] Phone Number: [phone] 10/19/2023, 3:00 PM  Clinical Narrative:  [facility] CSW met with pt at bedside. Pt stated that he lives with his brother. Pt stated that he has no history of HH services. Pt stated that he does not use any equipment. Pt stated that he has a scale at home. Pt stated that he does not have a PCP. CSW explained that a hospital follow up appointment will be scheduled closer to dc. Pt agrees. Pt stated that he he works full-time. Pt stated that he will need a work note.   CSW called and scheduled pts hospital follow up appointment for Tuesday, November 24, 2023 at 9:00 AM.   TOC will continue following.                        Patient Goals and CMS Choice            Expected Discharge Plan and Services                                              Prior Living Arrangements/Services                       Activities of Daily Living   ADL Screening (condition at time of admission) Independently performs ADLs?: Yes (appropriate for developmental age) Is the patient deaf or have difficulty hearing?: No Does the patient have difficulty seeing, even when wearing glasses/contacts?: No Does the patient have difficulty concentrating, remembering, or making decisions?: No  Permission Sought/Granted                  Emotional Assessment              Admission diagnosis:  Anasarca [R60.1] Acute pulmonary edema (HCC) [J81.0] AKI (acute kidney injury) (HCC) [N17.9] Patient Active Problem List   Diagnosis Date Noted   Anasarca 10/16/2023   Hypertensive emergency 10/16/2023   Acute heart failure (HCC) 10/16/2023   Acute renal failure superimposed on stage 3b chronic kidney disease (HCC) 10/16/2023   Cough 10/16/2023   SIRS (systemic  inflammatory response syndrome) (HCC) 10/16/2023   HIV disease (HCC) 04/08/2023   Screening examination for venereal disease 04/08/2023   Rectal pain 04/08/2023   Encounter for long-term (current) use of medications 04/08/2023   Latent syphilis 11/11/2022   Renal insufficiency 06/15/2018   Healthcare maintenance 11/04/2017   Depression 10/27/2017   Former smoker 12/08/2013   Positive hepatitis C antibody test 11/18/2013   Thrombocytopenia, unspecified (HCC) 11/17/2013   Human immunodeficiency virus (HIV) disease (HCC) 11/17/2013   Epistaxis 11/17/2013   Normocytic anemia 11/17/2013   Chronic otitis media 11/17/2013   Transaminitis 11/17/2013   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 [place] [address] Phone: [phone] Fax: [phone]     Social Drivers of Health (SDOH) Social History: SDOH Screenings  Food Insecurity: No Food Insecurity (10/16/2023)  Housing: Low Risk  (10/16/2023)  Transportation Needs: No Transportation Needs (10/16/2023)  Utilities: Not At Risk (10/16/2023)  Depression (PHQ2-9): Low Risk  (12/03/2022)  Tobacco Use: Medium Risk (10/15/2023)   SDOH Interventions:     Readmission Risk Interventions     No data to display

## 2023-10-19 NOTE — Progress Notes (Signed)
 Advanced Heart Failure Rounding Note  Cardiologist: Dr. Zenaida  Chief Complaint: dyspnea  Subjective:    Feels much better today. Breathing much improved w/ diuresis. Able to sleep better at night. No longer orthopneic.    Objective:   Weight Range: 103.2 kg Body mass index is 30.02 kg/m.   Vital Signs:   Temp:  [98.4 F (36.9 C)-99.8 F (37.7 C)] 98.8 F (37.1 C) (01/13 0759) Pulse Rate:  [108] 108 (01/13 0759) Resp:  [16-18] 18 (01/13 0759) BP: (133-162)/(95-117) 162/117 (01/13 0759) SpO2:  [93 %-100 %] 100 % (01/13 0759) Weight:  [103.2 kg] 103.2 kg (01/13 0500) Last BM Date : 10/14/23  Weight change: Filed Weights   10/17/23 0500 10/18/23 0500 10/19/23 0500  Weight: 110.4 kg 103.8 kg 103.2 kg    Intake/Output:   Intake/Output Summary (Last 24 hours) at 10/19/2023 1129 Last data filed at 10/19/2023 0844 Gross per 24 hour  Intake 850 ml  Output 3850 ml  Net -3000 ml      Physical Exam    General:  Well appearing young male. No resp difficulty HEENT: Normal Neck: Supple. JVP mildly elevated . Carotids 2+ bilat; no bruits. No lymphadenopathy or thyromegaly appreciated. Cor: PMI nondisplaced. Regular rate & rhythm. No rubs, gallops or murmurs. Lungs: Clear Abdomen: Soft, nontender, nondistended. No hepatosplenomegaly. No bruits or masses. Good bowel sounds. Extremities: No cyanosis, clubbing, rash, edema Neuro: Alert & orientedx3, cranial nerves grossly intact. moves all 4 extremities w/o difficulty. Affect pleasant   Telemetry   Sinus tach 110s   EKG    N/A   Labs    CBC No results for input(s): WBC, NEUTROABS, HGB, HCT, MCV, PLT in the last 72 hours. Basic Metabolic Panel Recent Labs    98/87/74 0604 10/19/23 0437  NA 136 135  K 3.7 3.6  CL 102 97*  CO2 25 27  GLUCOSE 101* 99  BUN 23* 23*  CREATININE 3.36* 3.51*  CALCIUM 8.2* 8.6*  MG 1.3* 1.9  PHOS 4.2 4.8*   Liver Function Tests No results for input(s): AST,  ALT, ALKPHOS, BILITOT, PROT, ALBUMIN  in the last 72 hours. No results for input(s): LIPASE, AMYLASE in the last 72 hours. Cardiac Enzymes No results for input(s): CKTOTAL, CKMB, CKMBINDEX, TROPONINI in the last 72 hours.  BNP: BNP (last 3 results) Recent Labs    10/15/23 2221  BNP 1,864.1*    ProBNP (last 3 results) No results for input(s): PROBNP in the last 8760 hours.   D-Dimer No results for input(s): DDIMER in the last 72 hours. Hemoglobin A1C No results for input(s): HGBA1C in the last 72 hours. Fasting Lipid Panel No results for input(s): CHOL, HDL, LDLCALC, TRIG, CHOLHDL, LDLDIRECT in the last 72 hours. Thyroid Function Tests No results for input(s): TSH, T4TOTAL, T3FREE, THYROIDAB in the last 72 hours.  Invalid input(s): FREET3  Other results:   Imaging    No results found.   Medications:     Scheduled Medications:  docusate sodium   200 mg Oral BID   furosemide   80 mg Oral Daily   heparin   5,000 Units Subcutaneous Q8H   hydrALAZINE   100 mg Oral Q8H   isosorbide  mononitrate  60 mg Oral Daily    Infusions:   PRN Medications: acetaminophen  **OR** acetaminophen , guaiFENesin , ondansetron  **OR** ondansetron  (ZOFRAN ) IV    Patient Profile   38 year old with a history of HIV on cabenuva  and CKD Stage IIIb admitted w/ hypertensive emergency and new systolic heart failure.  Assessment/Plan   1. Acute HFrEF Etiology unknown but suspect HTN. No previous cardiac history. Has history of HIV . - Echo this admit w/ severely reduced LVEF < 20% and mildly reduced RV + prominent LV trabeculations  - NYHA lll on admit. Now improved w/ diuresis and near euvolemic  - plan RHC today + cRMI  - GDMT limited due to CKD.  - Continue Bidil  2 tab three tid  - if not low output on RHC, can add ? blocker    2. HTN Emergency  - BP improving but remains elevated  - Continue Bidil . Co-pay for Bidil  $10.00  - continue  Cardura  and start amlodipine   - plan to start Coreg  if CO ok on RHC    3. AKI on CKD Stage IIIB - SCr 3.1 on admit, up to 3.51 today (b/l 2.5-2.9) - Renal US - no hydronephrosis. Chronic medical renal disease - No recent NSAIDs.  - nephrology following - RHC today to assess CO     Length of Stay: [id number] Marcine RIGGERS  10/19/2023, 11:29 AM  Advanced Heart Failure Team Pager [phone] (M-F; 7a - 5p)  Please contact [facility] for night-coverage after hours (5p -7a ) and weekends on [email or web address]

## 2023-10-19 NOTE — Progress Notes (Signed)
 PROGRESS NOTE        PATIENT DETAILS Name: [name] Age: 38 y.o. Sex: male Date of Birth: October 16, 1985 Admit Date: 10/15/2023 Admitting Physician Delayne Cleatus GAILS, MD ERE:Ejupzwu, No Pcp Per  Brief Summary: Patient is a 38 y.o.  male with history of HIV on ART, CKD 4-presented with history of exertional dyspnea/lower extremity edema-he was found to have new diagnosis of decompensated systolic heart failure-hypertensive emergency-and superimposed AKI-he was subsequently admitted to the hospitalist service.  Significant events: 1/9>> admit to [facility]-hypertensive emergency/decompensated heart failure  Significant studies: 1/10>> renal ultrasound: No hydronephrosis 1/10>> echo: EF<20%, grade 2 diastolic dysfunction, reduced RV systolic function  Significant microbiology data: 1/9>> COVID/influenza/RSV PCR: Negative  Procedures: None  Consults: Advanced heart failure team Nephrology  Subjective:  Patient in bed, appears comfortable, denies any headache, no fever, no chest pain or pressure, no shortness of breath , no abdominal pain. No new focal weakness.   Objective: Vitals: Blood pressure (!) 162/117, pulse (!) 108, temperature 98.8 F (37.1 C), temperature source Oral, resp. rate 18, height 6' 1 (1.854 m), weight 103.2 kg, SpO2 100%.   Exam:  Awake Alert, No new F.N deficits, Normal affect [place].AT,PERRAL Supple Neck, No JVD,   Symmetrical Chest wall movement, Good air movement bilaterally, bibasilar crackles RRR,No Gallops, Rubs or new Murmurs,  +ve B.Sounds, Abd Soft, No tenderness,   1+ lower extremity edema   Assessment/Plan:  Acute systolic heart failure Unclear etiology-advanced heart failure team following Volume status gradually improving On IV Lasix  80 mg twice daily since blood pressure is poorly controlled have switched BiDil  to moderate dose Imdur  and high-dose hydralazine  3 times a day, blood pressure still high on 10/19/2023 will  discuss with cardiology if cleared by them will place on Coreg  for better blood pressure control.  Hypertensive emergency Blood pressure high medications adjusted as dictated above  AKI on CKD 4 18 seems to be stabilizing around 3.5 Unclear etiology for CKD 4-probably hypertensive nephropathy-HIV well-controlled-not felt to have HIV nephropathy-awaiting urine protein creatinine ratio and autoimmune workup Nephrology following.  HTN BP is quite high switch to Imdur  and hydralazine  from BiDil  to provide higher doses of both.  HIV Last CD4 count 509 on February 2028 On Cabenuva  Injections every 2 months [facility]s with [facility]  History of latent syphilis Appears to have gotten benzathine penicillin  March 2024 at [facility]  Class 1 Obesity: Estimated body mass index is 30.02 kg/m as calculated from the following:   Height as of this encounter: 6' 1 (1.854 m).   Weight as of this encounter: 103.2 kg.   Code status:   Code Status: Full Code   DVT Prophylaxis: Place TED hose Start: 10/16/23 1103 heparin  injection 5,000 Units Start: 10/16/23 0600    Family Communication: None at bedside   Disposition Plan: Status is: Inpatient Remains inpatient appropriate because: Severity of illness   Planned Discharge Destination:Home   Diet: Diet Order             Diet 2 gram sodium Room service appropriate? Yes; Fluid consistency: Thin  Diet effective now                     Antimicrobial agents: Anti-infectives (From admission, onward)    None        MEDICATIONS: Scheduled Meds:  docusate sodium   200 mg Oral BID  furosemide   80 mg Oral Daily   heparin   5,000 Units Subcutaneous Q8H   hydrALAZINE   100 mg Oral Q8H   isosorbide  mononitrate  60 mg Oral Daily   Continuous Infusions:   PRN Meds:.acetaminophen  **OR** acetaminophen , guaiFENesin , ondansetron  **OR** ondansetron  (ZOFRAN ) IV   I have personally reviewed following labs and imaging studies  LABORATORY  DATA:  Recent Labs  Lab 10/15/23 2221  WBC 9.4  HGB 14.1  HCT 42.5  PLT 198  MCV 88.5  MCH 29.4  MCHC 33.2  RDW 13.3    Recent Labs  Lab 10/15/23 2221 10/16/23 0210 10/16/23 0840 10/17/23 0540 10/18/23 0604 10/19/23 0437  NA 136  --  139 138 136 135  K 4.3  --  3.8 3.5 3.7 3.6  CL 107  --  107 105 102 97*  CO2 21*  --  23 26 25 27   ANIONGAP 8  --  9 7 9 11   GLUCOSE 102*  --  116* 96 101* 99  BUN 30*  --  28* 28* 23* 23*  CREATININE 3.11*  --  3.11* 3.38* 3.36* 3.51*  AST  --  50*  --   --   --   --   ALT  --  40  --   --   --   --   ALKPHOS  --  94  --   --   --   --   BILITOT  --  1.3*  --   --   --   --   ALBUMIN   --  2.5*  --   --   --   --   PROCALCITON  --  <0.10  --   --   --   --   TSH  --   --  1.384  --   --   --   BNP 1,864.1*  --   --   --   --   --   MG  --   --   --   --  1.3* 1.9  PHOS  --   --   --  4.1 4.2 4.8*  CALCIUM 8.6*  --  8.5* 8.2* 8.2* 8.6*    Lab Results  Component Value Date   CHOL 202 (H) 04/08/2023   HDL 47 04/08/2023   LDLCALC 131 (H) 04/08/2023   TRIG 126 04/08/2023   CHOLHDL 4.3 04/08/2023      Recent Labs  Lab 10/15/23 2221 10/16/23 0210 10/16/23 0840 10/17/23 0540 10/18/23 0604 10/19/23 0437  PROCALCITON  --  <0.10  --   --   --   --   TSH  --   --  1.384  --   --   --   BNP 1,864.1*  --   --   --   --   --   MG  --   --   --   --  1.3* 1.9  CALCIUM 8.6*  --  8.5* 8.2* 8.2* 8.6*    RADIOLOGY STUDIES/RESULTS: No results found.    LOS: 3 days   Signature  -    Lavada Stank M.D on 10/19/2023 at 10:38 AM   -  To page go to [email or web address]

## 2023-10-19 NOTE — Interval H&P Note (Signed)
 History and Physical Interval Note:  10/19/2023 2:52 PM  [name]  has presented today for surgery, with the diagnosis of heart failure - km.  The various methods of treatment have been discussed with the patient and family. After consideration of risks, benefits and other options for treatment, the patient has consented to  Procedure(s): RIGHT HEART CATH (N/A) as a surgical intervention.  The patient's history has been reviewed, patient examined, no change in status, stable for surgery.  I have reviewed the patient's chart and labs.  Questions were answered to the patient's satisfaction.     Diedra Sinor [employer]

## 2023-10-19 NOTE — Plan of Care (Signed)

## 2023-10-19 NOTE — Progress Notes (Signed)
 Patient off floor to cath lab.

## 2023-10-19 NOTE — Progress Notes (Signed)
Patient back to room from cath lab.

## 2023-10-20 ENCOUNTER — Other Ambulatory Visit (HOSPITAL_COMMUNITY): Payer: Self-pay

## 2023-10-20 ENCOUNTER — Encounter (HOSPITAL_COMMUNITY): Payer: Self-pay | Admitting: Cardiology

## 2023-10-20 ENCOUNTER — Inpatient Hospital Stay (HOSPITAL_COMMUNITY): Payer: BC Managed Care – PPO

## 2023-10-20 DIAGNOSIS — I340 Nonrheumatic mitral (valve) insufficiency: Secondary | ICD-10-CM | POA: Diagnosis not present

## 2023-10-20 DIAGNOSIS — I3139 Other pericardial effusion (noninflammatory): Secondary | ICD-10-CM | POA: Diagnosis not present

## 2023-10-20 DIAGNOSIS — I361 Nonrheumatic tricuspid (valve) insufficiency: Secondary | ICD-10-CM

## 2023-10-20 DIAGNOSIS — I5021 Acute systolic (congestive) heart failure: Secondary | ICD-10-CM | POA: Diagnosis not present

## 2023-10-20 LAB — BASIC METABOLIC PANEL
Anion gap: 9 (ref 5–15)
BUN: 29 mg/dL — ABNORMAL HIGH (ref 6–20)
CO2: 28 mmol/L (ref 22–32)
Calcium: 8.7 mg/dL — ABNORMAL LOW (ref 8.9–10.3)
Chloride: 99 mmol/L (ref 98–111)
Creatinine, Ser: 3.66 mg/dL — ABNORMAL HIGH (ref 0.61–1.24)
GFR, Estimated: 21 mL/min — ABNORMAL LOW (ref >60)
Glucose, Bld: 82 mg/dL (ref 70–99)
Potassium: 4.3 mmol/L (ref 3.5–5.1)
Sodium: 136 mmol/L (ref 135–145)

## 2023-10-20 LAB — MAGNESIUM: Magnesium: 1.9 mg/dL (ref 1.7–2.4)

## 2023-10-20 LAB — PHOSPHORUS: Phosphorus: 5.3 mg/dL — ABNORMAL HIGH (ref 2.5–4.6)

## 2023-10-20 LAB — ANA W/REFLEX IF POSITIVE: Anti Nuclear Antibody (ANA): NEGATIVE

## 2023-10-20 MED ORDER — DOXAZOSIN MESYLATE 4 MG PO TABS
4.0000 mg | ORAL_TABLET | Freq: Every day | ORAL | 0 refills | Status: DC
  Filled 2023-10-20: qty 30, 30d supply, fill #0

## 2023-10-20 MED ORDER — TORSEMIDE 20 MG PO TABS
ORAL_TABLET | ORAL | 0 refills | Status: DC
  Filled 2023-10-20: qty 60, 30d supply, fill #0

## 2023-10-20 MED ORDER — ISOSORB DINITRATE-HYDRALAZINE 20-37.5 MG PO TABS
2.0000 | ORAL_TABLET | Freq: Three times a day (TID) | ORAL | 0 refills | Status: DC
  Filled 2023-10-20: qty 180, 30d supply, fill #0

## 2023-10-20 MED ORDER — MAGNESIUM SULFATE 2 GM/50ML IV SOLN
2.0000 g | Freq: Once | INTRAVENOUS | Status: AC
  Administered 2023-10-20: 2 g via INTRAVENOUS
  Filled 2023-10-20: qty 50

## 2023-10-20 MED ORDER — AMLODIPINE BESYLATE 10 MG PO TABS
10.0000 mg | ORAL_TABLET | Freq: Every day | ORAL | 0 refills | Status: DC
  Filled 2023-10-20: qty 30, 30d supply, fill #0

## 2023-10-20 MED ORDER — HYDRALAZINE HCL 100 MG PO TABS
100.0000 mg | ORAL_TABLET | Freq: Three times a day (TID) | ORAL | 0 refills | Status: DC
  Filled 2023-10-20: qty 90, 30d supply, fill #0

## 2023-10-20 MED ORDER — CARVEDILOL 3.125 MG PO TABS
3.1250 mg | ORAL_TABLET | Freq: Two times a day (BID) | ORAL | 11 refills | Status: DC
  Filled 2023-10-20: qty 60, 30d supply, fill #0

## 2023-10-20 MED ORDER — GADOBUTROL 1 MMOL/ML IV SOLN
10.0000 mL | Freq: Once | INTRAVENOUS | Status: AC | PRN
  Administered 2023-10-20: 10 mL via INTRAVENOUS

## 2023-10-20 MED ORDER — ISOSORBIDE MONONITRATE ER 60 MG PO TB24
60.0000 mg | ORAL_TABLET | Freq: Every day | ORAL | 0 refills | Status: DC
  Filled 2023-10-20: qty 30, 30d supply, fill #0

## 2023-10-20 NOTE — Discharge Summary (Signed)
 [name] FMW:[id number] DOB: 1986/07/14 DOA: 10/15/2023  PCP: Lucius Krabbe, NP  Admit date: 10/15/2023  Discharge date: 10/20/2023  Admitted From: Home   Disposition:  Home   Recommendations for Outpatient Follow-up:   Follow up with PCP in 1-2 weeks  PCP Please obtain BMP/CBC, 2 view CXR in 1week,  (see Discharge instructions)   PCP Please follow up on the following pending results:    Home Health: None   Equipment/Devices: None  Consultations: Nephrology, Cards Discharge Condition: Stable    CODE STATUS: Full    Diet Recommendation: Heart Healthy with strict 1.5 L fluid restriction per day    Chief Complaint  Patient presents with   Cough   Leg Swelling     Brief history of present illness from the day of admission and additional interim summary    38 y.o.  male with history of HIV on ART, CKD 4-presented with history of exertional dyspnea/lower extremity edema-he was found to have new diagnosis of decompensated systolic heart failure-hypertensive emergency-and superimposed AKI-he was subsequently admitted to the hospitalist service.   Significant events: 1/9>> admit to [facility]-hypertensive emergency/decompensated heart failure   Significant studies: 1/10>> renal ultrasound: No hydronephrosis 1/10>> echo: EF<20%, grade 2 diastolic dysfunction, reduced RV systolic function 1/14 - CARDIAC CATHETERIZATION - /13/2025 -  Low filling pressures. 2. Preserved cardiac output   Significant microbiology data: 1/9>> COVID/influenza/RSV PCR: Negative   Procedures: None   Consults: Advanced heart failure team Nephrology                                                                 Hospital Course   Acute systolic heart failure EF under 20% Unclear etiology-advanced heart failure team  following Volume status is now stabilized, he is symptom-free, underwent cardiac catheterization suggestive of low filling pressures and preserved cardiac output, case discussed with both cardiology and nephrology today.  Stable for discharge -not on ACE/ARB/Entresto or Jardiance due to underlying renal insufficiency.  Postdischarge will follow with PCP, cardiology and nephrology.  Meds per Cards  Cards recommends - Cardiac/BP Meds for Discharge Amlodipine  10 mg daily Coreg  3.125 mg bid Cardura  4 mg daily Bidil  2 tablet tid Torsemide  40 mg PRN (for wt gain, > 3 lb in 24/hr or > 5 lb in 1 wk      Hypertensive emergency Blood pressure high medications adjusted as dictated above   AKI on CKD 4 18 seems to be stabilizing around 3.5 Unclear etiology for CKD 4-probably hypertensive nephropathy-HIV well-controlled-not felt to have HIV nephropathy-awaiting urine protein creatinine ratio and autoimmune workup Nephrology following.   HTN BP patient's adjusted per cardiology recommendation to the present regimen of Coreg , BiDil , as needed diuretic, Cardura .  PCP to monitor and adjust   HIV Last CD4 count 509 on February 2028 On Cabenuva   Injections every 2 months Follows with [facility] clinic continue home regimen unchanged.   History of latent syphilis Appears to have gotten benzathine penicillin  March 2024 at [facility]   Class 1 Obesity: Estimated body mass index is 30.02 kg/m with PCP for weight loss  Discharge diagnosis     Principal Problem:   Acute heart failure (HCC) Active Problems:   Hypertensive emergency   Anasarca   Acute renal failure superimposed on stage 3b chronic kidney disease (HCC)   Cough   Human immunodeficiency virus (HIV) disease (HCC)   SIRS (systemic inflammatory response syndrome) (HCC)   Acute pulmonary edema [facility])    Discharge instructions    Discharge Instructions     Discharge instructions   Complete by: As directed    Follow with Primary MD Lucius Krabbe, NP in 7 days, also follow-up with the recommended cardiologist and nephrologist  Get CBC, CMP, Magnesium , 2 view Chest X ray -  checked next visit with your primary MD   Activity: As tolerated with Full fall precautions use walker/cane & assistance as needed  Disposition Home   Diet: Heart Healthy  Check your Weight same time everyday, if you gain over 2 pounds, or you develop in leg swelling, experience more shortness of breath or chest pain, call your Primary MD immediately. Follow Cardiac Low Salt Diet and 1.5 lit/day fluid restriction.  Special Instructions: If you have smoked or chewed Tobacco  in the last 2 yrs please stop smoking, stop any regular Alcohol  and or any Recreational drug use.  On your next visit with your primary care physician please Get Medicines reviewed and adjusted.  Please request your Prim.MD to go over all Hospital Tests and Procedure/Radiological results at the follow up, please get all Hospital records sent to your Prim MD by signing hospital release before you go home.  If you experience worsening of your admission symptoms, develop shortness of breath, life threatening emergency, suicidal or homicidal thoughts you must seek medical attention immediately by calling 911 or calling your MD immediately  if symptoms less severe.  You Must read complete instructions/literature along with all the possible adverse reactions/side effects for all the Medicines you take and that have been prescribed to you. Take any new Medicines after you have completely understood and accpet all the possible adverse reactions/side effects.   Do not drive when taking Pain medications.  Do not take more than prescribed Pain, Sleep and Anxiety Medications  Wear Seat belts while driving.   Increase activity slowly   Complete by: As directed        Discharge Medications   Allergies as of 10/20/2023   No Known Allergies      Medication List     TAKE these medications     acetaminophen  500 MG tablet Commonly known as: TYLENOL  Take 1,000 mg by mouth every 6 (six) hours as needed for mild pain (pain score 1-3), fever or headache.   amLODipine  10 MG tablet Commonly known as: NORVASC  Take 1 tablet (10 mg total) by mouth daily. Start taking on: October 21, 2023   Cabenuva  600 & 900 MG/3ML injection Generic drug: cabotegravir  & rilpivirine  ER Inject 1 kit into the muscle every 2 (two) months.   carvedilol  3.125 MG tablet Commonly known as: Coreg  Take 1 tablet (3.125 mg total) by mouth 2 (two) times daily.   doxazosin  4 MG tablet Commonly known as: CARDURA  Take 1 tablet (4 mg total) by mouth daily. Start taking on: October 21, 2023  isosorbide -hydrALAZINE  20-37.5 MG tablet Commonly known as: BiDil  Take 2 tablets by mouth 3 (three) times daily.   torsemide  20 MG tablet Commonly known as: DEMADEX  Take 2 tablets (40mg ) daily if needed for weight gain above 3 pounds from your baseline weight in 24 hours or 5 pounds above your dry weight in a week, check weight daily         Follow-up Information     Lucius Krabbe, NP. Go in 35 day(s).   Specialty: Family Medicine Why: Hospital follow up appointment for Tuesday, November 24, 2023 at 9:00 AM.  PLEASE ARRIVE 10-15 minutes early.  PLEASE call and cancel/reschedule if you CANNOT make appointment. Contact information: [address] [place] [address] [phone]         Zenaida Morene PARAS, MD Follow up.   Specialty: Cardiology Why: Hospital follow-up in the Advanced Heart Failure Clinic at [facility] [place] c  11/11/23 at 2:20 PM Contact information: [address] [place] [address] [phone]         Lucius Krabbe, NP. Schedule an appointment as soon as possible for a visit in 1 week(s).   Specialty: Family Medicine Contact information: [address] [place] [address] [phone]         Dolan Mateo Larger, MD. Schedule an  appointment as soon as possible for a visit in 1 week(s).   Specialties: Nephrology, Internal Medicine Contact information: [address] [place] [address] [phone]                 Major procedures and Radiology Reports - PLEASE review detailed and final reports thoroughly  -      CARDIAC CATHETERIZATION Result Date: 10/19/2023 1. Low filling pressures. 2. Preserved cardiac output  US  RENAL Result Date: 10/16/2023 CLINICAL DATA:  Acute renal failure EXAM: RENAL / URINARY TRACT ULTRASOUND COMPLETE COMPARISON:  None Available. FINDINGS: Right Kidney: Renal measurements: 10.1 x 3.7 x 5.5 cm = volume: 107.6 mL. Increased echogenicity. No mass or hydronephrosis visualized. Left Kidney: Renal measurements: 9.7 x 4.3 x 4.1 cm = volume: 90.7 mL. Increased echogenicity. No mass or hydronephrosis visualized. Bladder: Appears normal for degree of bladder distention. Other: None. IMPRESSION: 1. No hydronephrosis. 2. Increased renal echogenicity as can be seen with chronic medical renal disease. Electronically Signed   By: Bard Moats M.D.   On: 10/16/2023 09:20   ECHOCARDIOGRAM COMPLETE Result Date: 10/16/2023    ECHOCARDIOGRAM REPORT   Patient Name:   [name] Date of Exam: 10/16/2023 Medical Rec #:  [id number]     Height:       73.0 in Accession #:    [id number]    Weight:       223.0 lb Date of Birth:  03-Jun-1986     BSA:          2.254 m Patient Age:    37 years      BP:           164/123 mmHg Patient Gender: M             HR:           97 bpm. Exam Location:  Inpatient Procedure: 2D Echo, Color Doppler, Cardiac Doppler and Intracardiac            Opacification Agent Indications:    I50.31 Acute diastolic (congestive) heart failure  History:        Patient has no prior history of Echocardiogram examinations.  Sonographer:  H[facility] Senior RDCS Referring Phys: [id number] DELAYNE LULLA SOLIAN IMPRESSIONS  1. Prominent LV trabeculations without evidence of thrombus with contrast. Left ventricular  ejection fraction, by estimation, is <20%. Left ventricular ejection fraction by 2D MOD biplane is 18.5 %. The left ventricle has severely decreased function. The left ventricle demonstrates global hypokinesis. The left ventricular internal cavity size was moderately dilated. There is mild concentric left ventricular hypertrophy. Left ventricular diastolic parameters are consistent with Grade II diastolic dysfunction (pseudonormalization).  2. Right ventricular systolic function is mildly reduced. The right ventricular size is mildly enlarged. There is mildly elevated pulmonary artery systolic pressure. The estimated right ventricular systolic pressure is 42.9 mmHg.  3. The mitral valve is normal in structure. Mild mitral valve regurgitation. No evidence of mitral stenosis.  4. The aortic valve is tricuspid. Aortic valve regurgitation is not visualized. No aortic stenosis is present.  5. There is mild dilatation of the ascending aorta, measuring 41 mm.  6. The inferior vena cava is dilated in size with <50% respiratory variability, suggesting right atrial pressure of 15 mmHg.  7. Evidence of atrial level shunting detected by color flow Doppler. There is a small atrial septal defect with predominantly left to right shunting across the atrial septum. FINDINGS  Left Ventricle: Prominent LV trabeculations without evidence of thrombus with contrast. Left ventricular ejection fraction, by estimation, is <20%. Left ventricular ejection fraction by 2D MOD biplane is 18.5 %. The left ventricle has severely decreased  function. The left ventricle demonstrates global hypokinesis. Definity  contrast agent was given IV to delineate the left ventricular endocardial borders. The left ventricular internal cavity size was moderately dilated. There is mild concentric left ventricular hypertrophy. Left ventricular diastolic parameters are consistent with Grade II diastolic dysfunction (pseudonormalization). Right Ventricle: The right  ventricular size is mildly enlarged. No increase in right ventricular wall thickness. Right ventricular systolic function is mildly reduced. There is mildly elevated pulmonary artery systolic pressure. The tricuspid regurgitant  velocity is 2.64 m/s, and with an assumed right atrial pressure of 15 mmHg, the estimated right ventricular systolic pressure is 42.9 mmHg. Left Atrium: Left atrial size was normal in size. Right Atrium: Right atrial size was normal in size. Pericardium: There is no evidence of pericardial effusion. Mitral Valve: The mitral valve is normal in structure. Mild mitral valve regurgitation. No evidence of mitral valve stenosis. Tricuspid Valve: The tricuspid valve is normal in structure. Tricuspid valve regurgitation is mild. Aortic Valve: The aortic valve is tricuspid. Aortic valve regurgitation is not visualized. No aortic stenosis is present. Pulmonic Valve: The pulmonic valve was normal in structure. Pulmonic valve regurgitation is trivial. Aorta: The aortic root is normal in size and structure. There is mild dilatation of the ascending aorta, measuring 41 mm. Venous: The inferior vena cava is dilated in size with less than 50% respiratory variability, suggesting right atrial pressure of 15 mmHg. IAS/Shunts: Evidence of atrial level shunting detected by color flow Doppler. There is a small atrial septal defect with predominantly left to right shunting across the atrial septum.  LEFT VENTRICLE PLAX 2D                        Biplane EF (MOD) LVIDd:         6.50 cm         LV Biplane EF:   Left LVIDs:         5.70 cm  ventricular LV PW:         1.10 cm                          ejection LV IVS:        1.10 cm                          fraction by LVOT diam:     2.10 cm                          2D MOD LV SV:         50                               biplane is LV SV Index:   22                               18.5 %. LVOT Area:     3.46 cm                                 Diastology                                LV e' medial:    9.36 cm/s LV Volumes (MOD)               LV E/e' medial:  10.3 LV vol d, MOD    242.0 ml      LV e' lateral:   8.16 cm/s A2C:                           LV E/e' lateral: 11.8 LV vol d, MOD    204.0 ml A4C: LV vol s, MOD    189.0 ml A2C: LV vol s, MOD    170.0 ml A4C: LV SV MOD A2C:   53.0 ml LV SV MOD A4C:   204.0 ml LV SV MOD BP:    40.9 ml RIGHT VENTRICLE RV S prime:     10.40 cm/s TAPSE (M-mode): 2.5 cm LEFT ATRIUM              Index        RIGHT ATRIUM           Index LA diam:        4.10 cm  1.82 cm/m   RA Area:     20.10 cm LA Vol (A2C):   154.0 ml 68.34 ml/m  RA Volume:   63.00 ml  27.96 ml/m LA Vol (A4C):   89.8 ml  39.85 ml/m LA Biplane Vol: 119.0 ml 52.81 ml/m  AORTIC VALVE LVOT Vmax:   90.20 cm/s LVOT Vmean:  69.300 cm/s LVOT VTI:    0.145 m  AORTA Ao Root diam: 3.70 cm Ao Asc diam:  4.10 cm MITRAL VALVE               TRICUSPID VALVE MV Area (PHT): 4.29 cm    TR Peak grad:   27.9 mmHg MV Decel Time: 177 msec    TR Vmax:        264.00 cm/s MV  E velocity: 96.40 cm/s MV A velocity: 29.90 cm/s  SHUNTS MV E/A ratio:  3.22        Systemic VTI:  0.14 m                            Systemic Diam: 2.10 cm Dalton McleanMD Electronically signed by Ezra Kanner Signature Date/Time: 10/16/2023/8:58:07 AM    Final    DG Chest 2 View Result Date: 10/15/2023 CLINICAL DATA:  Cough EXAM: CHEST - 2 VIEW COMPARISON:  12/20/2018 FINDINGS: Cardiac shadow is prominent but accentuated by the frontal technique. Mild vascular congestion is noted with mild interstitial edema. No sizable effusion is seen. No focal infiltrate is noted. IMPRESSION: Changes consistent with CHF and mild edema. Electronically Signed   By: Oneil Devonshire M.D.   On: 10/15/2023 22:30    Micro Results    Recent Results (from the past 240 hours)  Resp panel by RT-PCR (RSV, Flu A&B, Covid) Anterior Nasal Swab     Status: None   Collection Time: 10/15/23 10:14 PM   Specimen: Anterior  Nasal Swab  Result Value Ref Range Status   SARS Coronavirus 2 by RT PCR NEGATIVE NEGATIVE Final   Influenza A by PCR NEGATIVE NEGATIVE Final   Influenza B by PCR NEGATIVE NEGATIVE Final    Comment: (NOTE) The Xpert Xpress SARS-CoV-2/FLU/RSV plus assay is intended as an aid in the diagnosis of influenza from Nasopharyngeal swab specimens and should not be used as a sole basis for treatment. Nasal washings and aspirates are unacceptable for Xpert Xpress SARS-CoV-2/FLU/RSV testing.  Fact Sheet for Patients: bloggercourse.com  Fact Sheet for Healthcare Providers: seriousbroker.it  This test is not yet approved or cleared by the [place]  FDA and has been authorized for detection and/or diagnosis of SARS-CoV-2 by FDA under an Emergency Use Authorization (EUA). This EUA will remain in effect (meaning this test can be used) for the duration of the COVID-19 declaration under Section 564(b)(1) of the Act, 21 U.S.C. section 360bbb-3(b)(1), unless the authorization is terminated or revoked.     Resp Syncytial Virus by PCR NEGATIVE NEGATIVE Final    Comment: (NOTE) Fact Sheet for Patients: bloggercourse.com  Fact Sheet for Healthcare Providers: seriousbroker.it  This test is not yet approved or cleared by the [place]  FDA and has been authorized for detection and/or diagnosis of SARS-CoV-2 by FDA under an Emergency Use Authorization (EUA). This EUA will remain in effect (meaning this test can be used) for the duration of the COVID-19 declaration under Section 564(b)(1) of the Act, 21 U.S.C. section 360bbb-3(b)(1), unless the authorization is terminated or revoked.  Performed at [facility], [address], [place], [place] [address]     Today   Subjective    [name] today has no headache,no chest abdominal pain,no new weakness tingling or numbness, feels  much better wants to go home today.    Objective   Blood pressure 121/82, pulse (!) 109, temperature 98.2 F (36.8 C), temperature source Oral, resp. rate (!) 24, height 6' 1 (1.854 m), weight 104 kg, SpO2 97%.   Intake/Output Summary (Last 24 hours) at 10/20/2023 1049 Last data filed at 10/20/2023 0600 Gross per 24 hour  Intake 360 ml  Output 650 ml  Net -290 ml    Exam  Awake Alert, No new F.N deficits,    [place].AT,PERRAL Supple Neck,   Symmetrical Chest wall movement, Good air movement bilaterally, CTAB RRR,No Gallops,   +  ve B.Sounds, Abd Soft, Non tender,  No Cyanosis, Clubbing or edema    Data Review   Recent Labs  Lab 10/15/23 2221 10/19/23 1512  WBC 9.4  --   HGB 14.1 14.3  14.3  HCT 42.5 42.0  42.0  PLT 198  --   MCV 88.5  --   MCH 29.4  --   MCHC 33.2  --   RDW 13.3  --     Recent Labs  Lab 10/15/23 2221 10/16/23 0210 10/16/23 0840 10/17/23 0540 10/18/23 0604 10/19/23 0437 10/19/23 1512 10/20/23 0428  NA 136  --  139 138 136 135 136  135 136  K 4.3  --  3.8 3.5 3.7 3.6 3.9  3.9 4.3  CL 107  --  107 105 102 97*  --  99  CO2 21*  --  23 26 25 27   --  28  ANIONGAP 8  --  9 7 9 11   --  9  GLUCOSE 102*  --  116* 96 101* 99  --  82  BUN 30*  --  28* 28* 23* 23*  --  29*  CREATININE 3.11*  --  3.11* 3.38* 3.36* 3.51*  --  3.66*  AST  --  50*  --   --   --   --   --   --   ALT  --  40  --   --   --   --   --   --   ALKPHOS  --  94  --   --   --   --   --   --   BILITOT  --  1.3*  --   --   --   --   --   --   ALBUMIN   --  2.5*  --   --   --   --   --   --   PROCALCITON  --  <0.10  --   --   --   --   --   --   TSH  --   --  1.384  --   --   --   --   --   BNP 1,864.1*  --   --   --   --   --   --   --   MG  --   --   --   --  1.3* 1.9  --  1.9  PHOS  --   --   --  4.1 4.2 4.8*  --  5.3*  CALCIUM 8.6*  --  8.5* 8.2* 8.2* 8.6*  --  8.7*    Total Time in preparing paper work, data evaluation and todays exam - 35 minutes  Signature  -     Lavada Stank M.D on 10/20/2023 at 10:49 AM   -  To page go to [email or web address]

## 2023-10-20 NOTE — Plan of Care (Signed)
                                      [facility]                            [address] [place], [place] [address]      [name] was admitted to the Hospital on 10/15/2023 and Discharged  10/20/2023 and should be excused from work/school   for 8 days starting from date -  10/15/2023 , may return to work/school without any restrictions.  Call Lavada Stank MD, [facility]  [phone] with questions.  Lavada Stank M.D on 10/20/2023,at 10:53 AM  [facility]   Office  [phone]

## 2023-10-20 NOTE — Discharge Instructions (Signed)
 Follow with Primary MD Lucius Krabbe, NP in 7 days, also follow-up with the recommended cardiologist and nephrologist  Get CBC, CMP, Magnesium , 2 view Chest X ray -  checked next visit with your primary MD   Activity: As tolerated with Full fall precautions use walker/cane & assistance as needed  Disposition Home   Diet: Heart Healthy  Check your Weight same time everyday, if you gain over 2 pounds, or you develop in leg swelling, experience more shortness of breath or chest pain, call your Primary MD immediately. Follow Cardiac Low Salt Diet and 1.5 lit/day fluid restriction.  Special Instructions: If you have smoked or chewed Tobacco  in the last 2 yrs please stop smoking, stop any regular Alcohol  and or any Recreational drug use.  On your next visit with your primary care physician please Get Medicines reviewed and adjusted.  Please request your Prim.MD to go over all Hospital Tests and Procedure/Radiological results at the follow up, please get all Hospital records sent to your Prim MD by signing hospital release before you go home.  If you experience worsening of your admission symptoms, develop shortness of breath, life threatening emergency, suicidal or homicidal thoughts you must seek medical attention immediately by calling 911 or calling your MD immediately  if symptoms less severe.  You Must read complete instructions/literature along with all the possible adverse reactions/side effects for all the Medicines you take and that have been prescribed to you. Take any new Medicines after you have completely understood and accpet all the possible adverse reactions/side effects.   Do not drive when taking Pain medications.  Do not take more than prescribed Pain, Sleep and Anxiety Medications  Wear Seat belts while driving.

## 2023-10-20 NOTE — Plan of Care (Signed)

## 2023-10-20 NOTE — Progress Notes (Signed)
 [facility] NEPHROLOGY PROGRESS NOTE  Assessment/ Plan: Pt is a 38 y.o. yo male with past medical history significant for hypertension, HIV, ITP, CKD 4, proteinuria admitted with CHF exacerbation and CKD.  # Acute CHF with preserved EF thought to be due to uncontrolled hypertension.  Echocardiogram with reduced EF of less than 20%.  He was treated with IV diuretics with improvement of volume status.  Titrated cardiac medications by cardiology team.  # Acute kidney injury on CKD stage IV thought to be due to hypertensive nephrosclerosis.  HIV seems to be well-controlled.  He does have chronic subnephrotic range proteinuria.  AKI due to CHF exacerbation/cardiorenal.  Now maintaining euvolemia with diuretics.  No signs or symptoms of uremia and no need for dialysis.  He will be discharged with oral diuretics with plan to follow-up at [facility] office.  # Subnephrotic range proteinuria: Urine protein creatinine ratio around 2.7 g.  Serologies including ANA, anti-GBM, ANCA, C3, C4 unremarkable.  No ACE inhibitor or ARB at this time because of AKI.  Needs close outpatient follow-up.  # Hypertensive emergency on admission with fluid overload improved with diuretics and antihypertensives.  # HIV: Follow-up with ID and continue home medication.  He is eager to go home today.  Discussed with the primary team.  Subjective: Seen and examined at the bedside.  He reports feeling much better and wanting to leave as soon as possible.  No nausea, vomiting, chest pain or shortness of breath.  No new event. Objective Vital signs in last 24 hours: Vitals:   10/20/23 0345 10/20/23 0500 10/20/23 0758 10/20/23 0759  BP: 121/82  (!) 141/106   Pulse: (!) 109  95 95  Resp: (!) 24  20 18   Temp: 98.2 F (36.8 C)  98.6 F (37 C) 98 F (36.7 C)  TempSrc: Oral  Oral   SpO2: 97%  100% 100%  Weight:  104 kg    Height:       Weight change: 0.8 kg  Intake/Output Summary (Last 24 hours) at  10/20/2023 1138 Last data filed at 10/20/2023 0600 Gross per 24 hour  Intake 360 ml  Output 650 ml  Net -290 ml       Labs: RENAL PANEL Recent Labs  Lab 10/16/23 0210 10/16/23 0840 10/17/23 0540 10/18/23 0604 10/19/23 0437 10/19/23 1512 10/20/23 0428  NA  --  139 138 136 135 136  135 136  K  --  3.8 3.5 3.7 3.6 3.9  3.9 4.3  CL  --  107 105 102 97*  --  99  CO2  --  23 26 25 27   --  28  GLUCOSE  --  116* 96 101* 99  --  82  BUN  --  28* 28* 23* 23*  --  29*  CREATININE  --  3.11* 3.38* 3.36* 3.51*  --  3.66*  CALCIUM  --  8.5* 8.2* 8.2* 8.6*  --  8.7*  MG  --   --   --  1.3* 1.9  --  1.9  PHOS  --   --  4.1 4.2 4.8*  --  5.3*  ALBUMIN  2.5*  --   --   --   --   --   --     Liver Function Tests: Recent Labs  Lab 10/16/23 0210  AST 50*  ALT 40  ALKPHOS 94  BILITOT 1.3*  PROT 5.8*  ALBUMIN  2.5*   No results for input(s): LIPASE, AMYLASE in the last  168 hours. No results for input(s): AMMONIA in the last 168 hours. CBC: Recent Labs    12/03/22 1132 04/02/23 0923 04/02/23 0933 10/15/23 2221 10/19/23 1512  HGB 14.1 14.6 14.6 14.1 14.3  14.3  MCV 88.0 88.8  --  88.5  --     Cardiac Enzymes: No results for input(s): CKTOTAL, CKMB, CKMBINDEX, TROPONINI in the last 168 hours. CBG: No results for input(s): GLUCAP in the last 168 hours.  Iron  Studies: No results for input(s): IRON , TIBC, TRANSFERRIN, FERRITIN in the last 72 hours. Studies/Results: CARDIAC CATHETERIZATION Result Date: 10/19/2023 1. Low filling pressures. 2. Preserved cardiac output   Medications: Infusions:   Scheduled Medications:  amLODipine   10 mg Oral Daily   docusate sodium   200 mg Oral BID   doxazosin   4 mg Oral Daily   heparin   5,000 Units Subcutaneous Q8H   hydrALAZINE   100 mg Oral Q8H   isosorbide  mononitrate  60 mg Oral Daily   Living Better with Heart Failure Book   Does not apply Once   sodium chloride  flush  3 mL Intravenous Q12H    have  reviewed scheduled and prn medications.  Physical Exam: General:NAD, comfortable Heart:RRR, s1s2 nl Lungs:clear b/l, no crackle Abdomen:soft, Non-tender, non-distended Extremities:No edema Neurology: Alert, awake and following commands.  Ambriel Gorelick Prasad Keani Gotcher 10/20/2023,11:38 AM  LOS: 4 days

## 2023-10-20 NOTE — Progress Notes (Signed)
 Advanced Heart Failure Rounding Note  Cardiologist: Dr. Zenaida  Chief Complaint: dyspnea  Subjective:    RHC yesterday showed low filling pressures and preserved CO.   Feels well this morning. Denies any further dyspnea. No LEE. BP improving, 120s-130s systolic.   Scr still up, 3.36>3.51>>3.66.   cMRI completed, read pending.    Right Heart Pressures RHC Procedural Findings: Hemodynamics (mmHg) RA mean 1 RV 16/1 PA 11/4, mean 8 PCWP mean 1 AO 108/58, mean 72  Oxygen saturations: PA 68% AO 96%  Cardiac Output (Fick) 5.57  Cardiac Index (Fick) 2.48  SVR 1020    Objective:   Weight Range: 104 kg Body mass index is 30.25 kg/m.   Vital Signs:   Temp:  [98 F (36.7 C)-99.4 F (37.4 C)] 98.2 F (36.8 C) (01/14 0345) Pulse Rate:  [93-110] 109 (01/14 0345) Resp:  [14-24] 24 (01/14 0345) BP: (89-130)/(57-88) 121/82 (01/14 0345) SpO2:  [95 %-98 %] 97 % (01/14 0345) Weight:  [104 kg] 104 kg (01/14 0500) Last BM Date : 10/14/23  Weight change: Filed Weights   10/18/23 0500 10/19/23 0500 10/20/23 0500  Weight: 103.8 kg 103.2 kg 104 kg    Intake/Output:   Intake/Output Summary (Last 24 hours) at 10/20/2023 1001 Last data filed at 10/20/2023 0600 Gross per 24 hour  Intake 360 ml  Output 650 ml  Net -290 ml      Physical Exam     General:  Well appearing. No respiratory difficulty HEENT: normal Neck: supple. no JVD. Carotids 2+ bilat; no bruits. No lymphadenopathy or thyromegaly appreciated. Cor: PMI nondisplaced. Regular rate & rhythm. No rubs, gallops or murmurs. Lungs: clear Abdomen: soft, nontender, nondistended. No hepatosplenomegaly. No bruits or masses. Good bowel sounds. Extremities: no cyanosis, clubbing, rash, edema Neuro: alert & oriented x 3, cranial nerves grossly intact. moves all 4 extremities w/o difficulty. Affect pleasant.   Telemetry   NSR 90s   EKG    N/A   Labs    CBC Recent Labs    10/19/23 1512  HGB 14.3  14.3   HCT 42.0  42.0   Basic Metabolic Panel Recent Labs    98/86/74 0437 10/19/23 1512 10/20/23 0428  NA 135 136  135 136  K 3.6 3.9  3.9 4.3  CL 97*  --  99  CO2 27  --  28  GLUCOSE 99  --  82  BUN 23*  --  29*  CREATININE 3.51*  --  3.66*  CALCIUM 8.6*  --  8.7*  MG 1.9  --  1.9  PHOS 4.8*  --  5.3*   Liver Function Tests No results for input(s): AST, ALT, ALKPHOS, BILITOT, PROT, ALBUMIN  in the last 72 hours. No results for input(s): LIPASE, AMYLASE in the last 72 hours. Cardiac Enzymes No results for input(s): CKTOTAL, CKMB, CKMBINDEX, TROPONINI in the last 72 hours.  BNP: BNP (last 3 results) Recent Labs    10/15/23 2221  BNP 1,864.1*    ProBNP (last 3 results) No results for input(s): PROBNP in the last 8760 hours.   D-Dimer No results for input(s): DDIMER in the last 72 hours. Hemoglobin A1C No results for input(s): HGBA1C in the last 72 hours. Fasting Lipid Panel No results for input(s): CHOL, HDL, LDLCALC, TRIG, CHOLHDL, LDLDIRECT in the last 72 hours. Thyroid Function Tests No results for input(s): TSH, T4TOTAL, T3FREE, THYROIDAB in the last 72 hours.  Invalid input(s): FREET3  Other results:   Imaging    CARDIAC  CATHETERIZATION Result Date: 10/19/2023 1. Low filling pressures. 2. Preserved cardiac output    Medications:     Scheduled Medications:  amLODipine   10 mg Oral Daily   docusate sodium   200 mg Oral BID   doxazosin   4 mg Oral Daily   heparin   5,000 Units Subcutaneous Q8H   hydrALAZINE   100 mg Oral Q8H   isosorbide  mononitrate  60 mg Oral Daily   Living Better with Heart Failure Book   Does not apply Once   sodium chloride  flush  3 mL Intravenous Q12H    Infusions:   PRN Medications: acetaminophen  **OR** acetaminophen , guaiFENesin , ondansetron  **OR** ondansetron  (ZOFRAN ) IV    Patient Profile   38 year old with a history of HIV on cabenuva  and CKD Stage IIIb admitted  w/ hypertensive emergency and new systolic heart failure.   Assessment/Plan   1. Acute HFrEF Etiology unknown but suspect HTN. No previous cardiac history. Has history of HIV . - Echo this admit w/ severely reduced LVEF < 20% and mildly reduced RV + prominent LV trabeculations  - cMRI completed and pending  - NYHA lll on admit. Now improved w/ diuresis and now dry-euvolemic  - RHC post diuresis showed low filling pressures and preserved CO (RA 1, PAP 11/4, PCWP 1, FICK CI 2.48, SVR 1020) - GDMT limited due to CKD.  - Continue Bidil  2 tab tid  - Start Coreg  3.125 mg bid - Torsemide  40 mg PRN at discharge (for wt gain, > 3 lb in 24hr or > 5 lb in 1 wk)    2. HTN Emergency  - BP improving 130s systolic  - Continue Bidil . Co-pay for Bidil  $10.00  - continue Cardura  and amlodipine   - start low dose Coreg  3.125 mg bid    3. AKI on CKD Stage IIIB - SCr 3.1 on admit, up to 3.66 today (b/l 2.5-2.9) - Renal US - no hydronephrosis. Chronic medical renal disease - No recent NSAIDs.  - nephrology following - RHC yesterday showed low filling pressures, preserved CO and normal SVR  - recommend PNR loop diuretic at d/c - stressed to pt that strict BP control and compliance w/ meds is imperative to delay disease progression  - will need outpatient nephrology f/u   Will arrange close f/u in the [facility] w/ Dr. Zenaida and will place appt info in AVS  Cardiac/BP Meds for Discharge  Amlodipine  10 mg daily  Coreg  3.125 mg bid  Cardura  4 mg daily  Bidil  2 tablet tid Torsemide  40 mg PRN (for wt gain, > 3 lb in 24/hr or > 5 lb in 1 wk)   Length of Stay: 4  Placido Hangartner, PA-C  10/20/2023, 10:01 AM  Advanced Heart Failure Team Pager [phone] (M-F; 7a - 5p)  Please contact [facility] for night-coverage after hours (5p -7a ) and weekends on [email or web address]

## 2023-10-21 ENCOUNTER — Ambulatory Visit: Payer: BC Managed Care – PPO | Admitting: Infectious Diseases

## 2023-10-21 ENCOUNTER — Encounter: Payer: Self-pay | Admitting: Infectious Diseases

## 2023-10-21 ENCOUNTER — Other Ambulatory Visit (HOSPITAL_COMMUNITY)
Admission: RE | Admit: 2023-10-21 | Discharge: 2023-10-21 | Disposition: A | Discharge status: Home / Self Care | Payer: BC Managed Care – PPO | Source: Ambulatory Visit | Attending: Infectious Diseases | Admitting: Infectious Diseases

## 2023-10-21 ENCOUNTER — Other Ambulatory Visit: Payer: Self-pay

## 2023-10-21 VITALS — BP 101/68 | HR 101 | Temp 97.9°F | Wt 225.0 lb

## 2023-10-21 DIAGNOSIS — Z113 Encounter for screening for infections with a predominantly sexual mode of transmission: Secondary | ICD-10-CM

## 2023-10-21 DIAGNOSIS — Z0000 Encounter for general adult medical examination without abnormal findings: Secondary | ICD-10-CM | POA: Insufficient documentation

## 2023-10-21 DIAGNOSIS — B20 Human immunodeficiency virus [HIV] disease: Secondary | ICD-10-CM | POA: Diagnosis not present

## 2023-10-21 DIAGNOSIS — Z23 Encounter for immunization: Secondary | ICD-10-CM | POA: Diagnosis not present

## 2023-10-21 DIAGNOSIS — Z5181 Encounter for therapeutic drug level monitoring: Secondary | ICD-10-CM | POA: Insufficient documentation

## 2023-10-21 MED ORDER — CABOTEGRAVIR & RILPIVIRINE ER 600 & 900 MG/3ML IM SUER
1.0000 | Freq: Once | INTRAMUSCULAR | Status: AC
  Administered 2023-10-21: 1 via INTRAMUSCULAR

## 2023-10-21 NOTE — Progress Notes (Addendum)
[address], [place], [place], [address]                                                                  Phn. [phone]; Fax: [phone]                                                                             Date: 10/21/23  Reason for Visit: Routine HIV care.   HPI: [name] is a 38 y.o.old male with a history of HIV on Biktarvy, HCV ab reactive ( HCV RNA negative in 2015 and 2016), Depression, syphilis who is here for routine fu  Interval hx/current visit: After last seen, he was switched to cobanuva injections starting 05/27/23.  He was admitted 1/9-1/14 where he was admitted for Acute CHF, hypertensive emergency, AKI on CKD. TTE with EF < 20%. Cardiac cath with Low filling pressures. Preserved cardiac output. Nephropathy thought to be related to HTN. Seen by Cardiologyand Nephrology IP.   He is here for his next shot of cabenuva. Willing to do STD screening as well as anal PAP. He also wants to get Menveo # 2 vaccine today. Reports he has a PCP fu arranged when discharged. He has no complaints.   ROS: As stated in above HPI; all other systems were reviewed and are otherwise negative unless noted below  No reported fever / chills, night sweats, unintentional weight loss, acute visual change, odynophagia, chest pain/pressure, new or worsened SOB or WOB, nausea, vomiting, diarrhea, dysuria, GU discharge, syncope, seizures, red/hot swollen joints, hallucinations / delusions, rashes, new allergies, unusual / excessive bleeding, swollen lymph nodes  PMH/ PSH/ FamHx / Social Hx , medications and allergies reviewed and updated as appropriate; please see corresponding tab in EHR / prior notes                                        Current Outpatient Medications on File Prior to Visit   Medication Sig Dispense Refill   acetaminophen (TYLENOL) 500 MG tablet Take 1,000 mg by mouth every 6 (six) hours as needed for mild pain (pain score 1-3), fever or headache.     amLODipine (NORVASC) 10 MG tablet Take 1 tablet (10 mg total) by mouth daily. 30 tablet 0   cabotegravir & rilpivirine ER (CABENUVA) 600 & 900 MG/3ML injection Inject 1 kit into the muscle every 2 (two) months. 6 mL 5   carvedilol (COREG) 3.125 MG tablet Take 1 tablet (3.125 mg total) by mouth 2 (two) times daily. 60 tablet 11  doxazosin (CARDURA) 4 MG tablet Take 1 tablet (4 mg total) by mouth daily. 30 tablet 0   isosorbide-hydrALAZINE (BIDIL) 20-37.5 MG tablet Take 2 tablets by mouth 3 (three) times daily. 180 tablet 0   torsemide (DEMADEX) 20 MG tablet Take 2 tablets (40mg ) daily if needed for weight gain above 3 pounds from your baseline weight in 24 hours or 5 pounds above your dry weight in a week, check weight daily 60 tablet 0   No current facility-administered medications on file prior to visit.    No Known Allergies  Past Medical History:  Diagnosis Date   HIV (human immunodeficiency virus infection) (HCC)    ITP (idiopathic thrombocytopenic purpura) 11/24/2013    Past Surgical History:  Procedure Laterality Date   ADENOIDECTOMY N/A 11/16/2013   Procedure: ADENOIDECTOMY;  Surgeon: Flo Shanks, MD;  Location: [facility] OR;  Service: ENT;  Laterality: N/A;   MYRINGOTOMY WITH TUBE PLACEMENT Bilateral 11/16/2013   Procedure: MYRINGOTOMY WITH TUBE PLACEMENT;  Surgeon: Flo Shanks, MD;  Location: [facility] OR;  Service: ENT;  Laterality: Bilateral;   NASAL ENDOSCOPY WITH EPISTAXIS CONTROL N/A 11/16/2013   Procedure: NASAL ENDOSCOPY with nasopharyngeal biopsy with frozen section and adnoidectomy;  Surgeon: Flo Shanks, MD;  Location: [facility] OR;  Service: ENT;  Laterality: N/A;   RIGHT HEART CATH N/A 10/19/2023   Procedure: RIGHT HEART CATH;  Surgeon: Laurey Morale, MD;  Location: [facility] INVASIVE CV LAB;  Service:  Cardiovascular;  Laterality: N/A;    Social History   Socioeconomic History   Marital status: Single    Spouse name: Not on file   Number of children: Not on file   Years of education: Not on file   Highest education level: Not on file  Occupational History   Not on file  Tobacco Use   Smoking status: Former    Current packs/day: 0.00    Average packs/day: 0.1 packs/day for 12.5 years (1.2 ttl pk-yrs)    Types: Cigarettes    Start date: 12/30/2001    Quit date: 06/26/2014    Years since quitting: 9.3   Smokeless tobacco: Never   Tobacco comments:    pt states he wants to quit by his birthday in July  Vaping Use   Vaping status: Every Day  Substance and Sexual Activity   Alcohol use: No   Drug use: No   Sexual activity: Not Currently    Partners: Female, Male    Birth control/protection: Condom    Comment: declined condoms  Other Topics Concern   Not on file  Social History Narrative   Not on file   Social Drivers of Health   Financial Resource Strain: Not on file  Food Insecurity: No Food Insecurity (10/16/2023)   Hunger Vital Sign    Worried About Running Out of Food in the Last Year: Never true    Ran Out of Food in the Last Year: Never true  Transportation Needs: No Transportation Needs (10/16/2023)   PRAPARE - Administrator, Civil Service (Medical): No    Lack of Transportation (Non-Medical): No  Physical Activity: Not on file  Stress: Not on file  Social Connections: Not on file  Intimate Partner Violence: Not At Risk (10/16/2023)   Humiliation, Afraid, Rape, and Kick questionnaire    Fear of Current or Ex-Partner: No    Emotionally Abused: No    Physically Abused: No    Sexually Abused: No   Family History  Problem Relation Age of Onset  Hypertension Neg Hx    Diabetes Neg Hx     Vitals  BP 101/68   Pulse (!) 101   Temp 97.9 F (36.6 C) (Oral)   Wt 225 lb (102.1 kg)   SpO2 94%   BMI 29.69 kg/m   Examination  Gen: no acute  distress HEENT: [place]/AT, no scleral icterus, no pale conjunctivae, hearing normal, oral mucosa moist Neck: Supple Cardio: Regular rate and rhythm Resp: Pulmonary effort normal in room air GI: nondistended GU: Musc: Extremities: No pedal edema Skin: No rashes Neuro: grossly non focal , awake, alert and oriented * 3  Psych: Calm, cooperative    Lab Results HIV 1 RNA Quant  Date Value  08/26/2023 Not Detected Copies/mL  06/24/2023 Not Detected Copies/mL  04/08/2023 NOT DETECTED copies/mL   CD4 T Cell Abs (/uL)  Date Value  12/03/2022 509  06/27/2021 383 (L)  03/26/2020 417   No results found for: "HIV1GENOSEQ" Lab Results  Component Value Date   WBC 9.4 10/15/2023   HGB 14.3 10/19/2023   HGB 14.3 10/19/2023   HCT 42.0 10/19/2023   HCT 42.0 10/19/2023   MCV 88.5 10/15/2023   PLT 198 10/15/2023    Lab Results  Component Value Date   CREATININE 3.66 (H) 10/20/2023   BUN 29 (H) 10/20/2023   NA 136 10/20/2023   K 4.3 10/20/2023   CL 99 10/20/2023   CO2 28 10/20/2023   Lab Results  Component Value Date   ALT 40 10/16/2023   AST 50 (H) 10/16/2023   ALKPHOS 94 10/16/2023   BILITOT 1.3 (H) 10/16/2023    Lab Results  Component Value Date   CHOL 202 (H) 04/08/2023   TRIG 126 04/08/2023   HDL 47 04/08/2023   LDLCALC 131 (H) 04/08/2023   Lab Results  Component Value Date   HAV Reactive (A) 11/17/2013   Lab Results  Component Value Date   HEPBSAG NEGATIVE 11/18/2013   HEPBSAB POSITIVE (A) 11/17/2013   Lab Results  Component Value Date   HCVAB Reactive (A) 11/18/2013   Lab Results  Component Value Date   CHLAMYDIAWP Negative 04/08/2023   CHLAMYDIAWP Negative 04/08/2023   N Negative 04/08/2023   N Negative 04/08/2023   Lab Results  Component Value Date   GCPROBEAPT NEGATIVE 11/17/2013   Lab Results  Component Value Date   QUANTGOLD INDETERM 11/17/2013    Health Maintenance: Immunization History  Administered Date(s) Administered   HPV 9-valent  06/24/2023, 08/26/2023   Influenza, Seasonal, Injecte, Preservative Fre 08/26/2023   Influenza,inj,Quad PF,6+ Mos 11/18/2013, 06/27/2014, 10/27/2017, 09/16/2018, 06/27/2021   Meningococcal Mcv4o 05/27/2023   PNEUMOCOCCAL CONJUGATE-20 05/27/2023   Pfizer Covid-19 Vaccine Bivalent Booster 72yrs & up 06/27/2021   Pfizer(Comirnaty)Fall Seasonal Vaccine 12 years and older 08/26/2023   Pneumococcal Polysaccharide-23 11/18/2013   Tdap 11/04/2017, 12/20/2018    Assessment/Plan: # HIV - Cabenuva today  - Labs today  - Fu in 2 months for next dose   # CKD/HTNCHF - has an upcoming fu with PCP and cardiology, management per them  # Depression - Stable with no reported SI/Hi  # STD Screening  - urine/oral and anal GC and RPR  # Immunization # Menveo # 2 today  # Health maintenance Anal pap today Dental care has been discussed.   Patient's labs were reviewed as well as his previous records. Patients questions were addressed and answered. Safe sex counseling done.  I have personally spent 40 minutes involved in face-to-face and non-face-to-face activities for this patient  on the day of the visit. Professional time spent includes the following activities: Preparing to see the patient (review of tests), Obtaining and/or reviewing separately obtained history (admission/discharge record), Performing a medically appropriate examination and/or evaluation , Ordering medications/tests/procedures, referring and communicating with other health care professionals, Documenting clinical information in the EMR, Independently interpreting results (not separately reported), Communicating results to the patient/family/caregiver, Counseling and educating the patient/family/caregiver and Care coordination (not separately reported).    Electronically signed by:  Odette Fraction, MD Infectious Disease Physician [facility] [address] [place],  [place] [address] Phone: [phone]  Fax: [phone]

## 2023-10-22 LAB — CYTOLOGY, (ORAL, ANAL, URETHRAL) ANCILLARY ONLY
Chlamydia: NEGATIVE
Chlamydia: NEGATIVE
Comment: NEGATIVE
Comment: NEGATIVE
Comment: NORMAL
Comment: NORMAL
Neisseria Gonorrhea: NEGATIVE
Neisseria Gonorrhea: NEGATIVE

## 2023-10-22 LAB — URINE CYTOLOGY ANCILLARY ONLY
Chlamydia: NEGATIVE
Comment: NEGATIVE
Comment: NORMAL
Neisseria Gonorrhea: NEGATIVE

## 2023-10-23 DIAGNOSIS — Z23 Encounter for immunization: Secondary | ICD-10-CM | POA: Insufficient documentation

## 2023-10-24 LAB — HIV RNA, RTPCR W/R GT (RTI, PI,INT)
HIV 1 RNA Quant: NOT DETECTED {copies}/mL
HIV-1 RNA Quant, Log: NOT DETECTED {Log}

## 2023-10-24 LAB — RPR TITER: RPR Titer: 1:1 {titer} — ABNORMAL HIGH

## 2023-10-24 LAB — T-HELPER CELLS (CD4) COUNT (NOT AT ARMC)
Absolute CD4: 382 {cells}/uL — ABNORMAL LOW (ref 490–1740)
CD4 T Helper %: 29 % — ABNORMAL LOW (ref 30–61)
Total lymphocyte count: 1337 {cells}/uL (ref 850–3900)

## 2023-10-24 LAB — RPR: RPR Ser Ql: REACTIVE — AB

## 2023-10-24 LAB — T PALLIDUM AB: T Pallidum Abs: POSITIVE — AB

## 2023-10-26 ENCOUNTER — Ambulatory Visit: Payer: BC Managed Care – PPO | Admitting: Urgent Care

## 2023-10-26 LAB — CYTOLOGY - PAP: Diagnosis: NEGATIVE

## 2023-10-27 ENCOUNTER — Telehealth: Payer: Self-pay

## 2023-10-27 NOTE — Telephone Encounter (Addendum)
-----   Message from Victoriano Lain sent at 10/27/2023  6:48 AM EST ----- Please let him know anal pap is unremarkable.   Please let patient know lab work is negative for any acute abnormality. RPR titre is stable at 1:1 and likely serofast state, nothing to do.

## 2023-10-27 NOTE — Telephone Encounter (Signed)
Called [facility] to relay results, no answer. Left HIPAA compliant voicemail requesting callback.   Sandie Ano, RN

## 2023-10-28 NOTE — Telephone Encounter (Signed)
Second attempt to reach patient, no answer. Voicemail left with previous attempt.   Beryle Flock, RN

## 2023-10-29 NOTE — Telephone Encounter (Signed)
Third attempt to reach patient - left voicemail asking patient to return call.   Gasper Hopes Lesli Albee, CMA

## 2023-11-02 ENCOUNTER — Ambulatory Visit (INDEPENDENT_AMBULATORY_CARE_PROVIDER_SITE_OTHER): Payer: BC Managed Care – PPO

## 2023-11-02 ENCOUNTER — Ambulatory Visit: Payer: BC Managed Care – PPO | Admitting: Urgent Care

## 2023-11-02 ENCOUNTER — Encounter: Payer: Self-pay | Admitting: Urgent Care

## 2023-11-02 ENCOUNTER — Other Ambulatory Visit: Payer: Self-pay | Admitting: Urgent Care

## 2023-11-02 VITALS — BP 138/100 | HR 95 | Ht 73.0 in | Wt 230.4 lb

## 2023-11-02 DIAGNOSIS — I161 Hypertensive emergency: Secondary | ICD-10-CM

## 2023-11-02 DIAGNOSIS — I5021 Acute systolic (congestive) heart failure: Secondary | ICD-10-CM

## 2023-11-02 DIAGNOSIS — N179 Acute kidney failure, unspecified: Secondary | ICD-10-CM

## 2023-11-02 DIAGNOSIS — N184 Chronic kidney disease, stage 4 (severe): Secondary | ICD-10-CM

## 2023-11-02 DIAGNOSIS — D649 Anemia, unspecified: Secondary | ICD-10-CM | POA: Diagnosis not present

## 2023-11-02 DIAGNOSIS — R0981 Nasal congestion: Secondary | ICD-10-CM | POA: Insufficient documentation

## 2023-11-02 DIAGNOSIS — R768 Other specified abnormal immunological findings in serum: Secondary | ICD-10-CM

## 2023-11-02 DIAGNOSIS — I509 Heart failure, unspecified: Secondary | ICD-10-CM | POA: Diagnosis not present

## 2023-11-02 DIAGNOSIS — R0683 Snoring: Secondary | ICD-10-CM

## 2023-11-02 DIAGNOSIS — J3489 Other specified disorders of nose and nasal sinuses: Secondary | ICD-10-CM

## 2023-11-02 DIAGNOSIS — B20 Human immunodeficiency virus [HIV] disease: Secondary | ICD-10-CM

## 2023-11-02 LAB — CBC WITH DIFFERENTIAL/PLATELET
Basophils Absolute: 0.1 10*3/uL (ref 0.0–0.1)
Basophils Relative: 1.1 % (ref 0.0–3.0)
Eosinophils Absolute: 0 10*3/uL (ref 0.0–0.7)
Eosinophils Relative: 0.8 % (ref 0.0–5.0)
HCT: 39.8 % (ref 39.0–52.0)
Hemoglobin: 13 g/dL (ref 13.0–17.0)
Lymphocytes Relative: 30.6 % (ref 12.0–46.0)
Lymphs Abs: 1.6 10*3/uL (ref 0.7–4.0)
MCHC: 32.8 g/dL (ref 30.0–36.0)
MCV: 88.1 fL (ref 78.0–100.0)
Monocytes Absolute: 0.7 10*3/uL (ref 0.1–1.0)
Monocytes Relative: 12.9 % — ABNORMAL HIGH (ref 3.0–12.0)
Neutro Abs: 2.8 10*3/uL (ref 1.4–7.7)
Neutrophils Relative %: 54.6 % (ref 43.0–77.0)
Platelets: 200 10*3/uL (ref 150.0–400.0)
RBC: 4.52 Mil/uL (ref 4.22–5.81)
RDW: 13.3 % (ref 11.5–15.5)
WBC: 5.1 10*3/uL (ref 4.0–10.5)

## 2023-11-02 LAB — COMPREHENSIVE METABOLIC PANEL
ALT: 13 U/L (ref 0–53)
AST: 20 U/L (ref 0–37)
Albumin: 3.6 g/dL (ref 3.5–5.2)
Alkaline Phosphatase: 85 U/L (ref 39–117)
BUN: 34 mg/dL — ABNORMAL HIGH (ref 6–23)
CO2: 27 meq/L (ref 19–32)
Calcium: 9.3 mg/dL (ref 8.4–10.5)
Chloride: 103 meq/L (ref 96–112)
Creatinine, Ser: 3.43 mg/dL — ABNORMAL HIGH (ref 0.40–1.50)
GFR: 21.6 mL/min — ABNORMAL LOW (ref >60.00)
Glucose, Bld: 124 mg/dL — ABNORMAL HIGH (ref 70–99)
Potassium: 4 meq/L (ref 3.5–5.1)
Sodium: 137 meq/L (ref 135–145)
Total Bilirubin: 0.7 mg/dL (ref 0.2–1.2)
Total Protein: 6.7 g/dL (ref 6.0–8.3)

## 2023-11-02 LAB — TSH: TSH: 1.59 u[IU]/mL (ref 0.35–5.50)

## 2023-11-02 MED ORDER — AZELASTINE-FLUTICASONE 137-50 MCG/ACT NA SUSP: 1.0000 | Freq: Two times a day (BID) | NASAL | 2 refills | Status: DC

## 2023-11-02 NOTE — Progress Notes (Signed)
New Patient Office Visit  Subjective:  Patient ID: [name], male    DOB: 12-Dec-1985  Age: 38 y.o. MRN: [id number]  CC:  Chief Complaint  Patient presents with   Establish Care    New pt est care. Elevated blood pressure. Discuss FMLA paperwork   Discussed the use of AI software for clinical note transcription with the patient who gave verbal consent to proceed.   HPI [name] presents to establish care   History of Present Illness The patient, with a recent hospital admission, presents with a history of high blood pressure and associated cardiac and renal complications. He reported experiencing dyspnea since around Thanksgiving, which progressively worsened to the point of breathlessness even with minimal exertion, such as walking from his car to his apartment. The patient initially attributed these symptoms to a possible respiratory infection and attempted self-medication with over-the-counter remedies, which proved ineffective.  In early January, due to the persistence and severity of his symptoms, the patient sought medical attention and was hospitalized for five days. During this time, he was informed that his high blood pressure was causing significant health issues, including effects on his heart and kidneys.  The patient underwent a cardiac catheterization during his hospital stay, but he was not aware of any identified obstructions. He was discharged with multiple medications, including a diuretic for fluid management. Since his hospital discharge, the patient reported significant improvement in his symptoms, including the ability to resume physical activities such as gym workouts. He also reported occasional leg swelling, which he manages with the prescribed diuretic as needed.  The patient has a history of Hepatitis C, but it is unclear whether this was treated or resolved. He also reported nasal congestion and difficulty breathing upon waking, which he has not  previously sought medical attention for. 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 He has been monitoring his blood pressure at home, with recent readings in the 130s systolic range.   Outpatient Encounter Medications as of 11/02/2023  Medication Sig   amLODipine (NORVASC) 10 MG tablet Take 1 tablet (10 mg total) by mouth daily.   Azelastine-Fluticasone 137-50 MCG/ACT SUSP Place 1 spray into the nose every 12 (twelve) hours.   cabotegravir & rilpivirine ER (CABENUVA) 600 & 900 MG/3ML injection Inject 1 kit into the muscle every 2 (two) months.   carvedilol (COREG) 3.125 MG tablet Take 1 tablet (3.125 mg total) by mouth 2 (two) times daily.   doxazosin (CARDURA) 4 MG tablet Take 1 tablet (4 mg total) by mouth daily.   isosorbide-hydrALAZINE (BIDIL) 20-37.5 MG tablet Take 2 tablets by mouth 3 (three) times daily.   torsemide (DEMADEX) 20 MG tablet Take 2 tablets (40mg ) daily if needed for weight gain above 3 pounds from your baseline weight in 24 hours or 5 pounds above your dry weight in a week, check weight daily   acetaminophen (TYLENOL) 500 MG tablet Take 1,000 mg by mouth every 6 (six) hours as needed for mild pain (pain score 1-3), fever or headache. (Patient not taking: Reported on 11/02/2023)   No facility-administered encounter medications on file as of 11/02/2023.    Past Medical History:  Diagnosis Date   HIV (human immunodeficiency virus infection) (HCC)    ITP (idiopathic thrombocytopenic purpura) 11/24/2013    Past Surgical History:  Procedure Laterality Date   ADENOIDECTOMY N/A 11/16/2013   Procedure: ADENOIDECTOMY;  Surgeon: Flo Shanks, MD;  Location: [facility] OR;  Service: ENT;  Laterality: N/A;   MYRINGOTOMY WITH TUBE PLACEMENT Bilateral 11/16/2013   Procedure: MYRINGOTOMY WITH TUBE PLACEMENT;  Surgeon: Flo Shanks, MD;  Location: [facility] OR;  Service: ENT;  Laterality: Bilateral;   NASAL ENDOSCOPY WITH  EPISTAXIS CONTROL N/A 11/16/2013   Procedure: NASAL ENDOSCOPY with nasopharyngeal biopsy with frozen section and adnoidectomy;  Surgeon: Flo Shanks, MD;  Location: [facility] OR;  Service: ENT;  Laterality: N/A;   RIGHT HEART CATH N/A 10/19/2023   Procedure: RIGHT HEART CATH;  Surgeon: Laurey Morale, MD;  Location: [facility] INVASIVE CV LAB;  Service: Cardiovascular;  Laterality: N/A;    Family History  Problem Relation Age of Onset   Hypertension Neg Hx    Diabetes Neg Hx     Social History   Socioeconomic History   Marital status: Single    Spouse name: Not on file   Number of children: Not on file   Years of education: Not on file   Highest education level: Not on file  Occupational History   Not on file  Tobacco Use   Smoking status: Former    Current packs/day: 0.00    Average packs/day: 0.1 packs/day for 12.5 years (1.2 ttl pk-yrs)    Types: Cigarettes    Start date: 12/30/2001    Quit date: 06/26/2014    Years since quitting: 9.3   Smokeless tobacco: Never   Tobacco comments:    pt states he wants to quit by his birthday in July  Vaping Use   Vaping status: Every Day  Substance and Sexual Activity   Alcohol use: No   Drug use: No   Sexual activity: Not Currently    Partners: Female, Male    Birth control/protection: Condom    Comment: declined condoms  Other Topics Concern   Not on file  Social History Narrative   Not on file   Social Drivers of Health   Financial Resource Strain: Not on file  Food Insecurity: No Food Insecurity (10/16/2023)   Hunger Vital Sign    Worried About Running Out of Food in the Last Year: Never true    Ran Out of Food in the Last Year: Never true  Transportation Needs: No Transportation Needs (10/16/2023)   PRAPARE - Administrator, Civil Service (Medical): No    Lack of Transportation (Non-Medical): No  Physical Activity: Not on file  Stress: Not on file  Social Connections: Not on file  Intimate Partner Violence: Not At Risk  (10/16/2023)   Humiliation, Afraid, Rape, and Kick questionnaire    Fear of Current or Ex-Partner: No    Emotionally Abused: No    Physically Abused: No    Sexually Abused: No    ROS: as noted in HPI  Objective:  BP (!) 138/100   Pulse 95   Ht 6\' 1"  (1.854 m)   Wt 230 lb 6.4 oz (104.5 kg)   SpO2 97%   BMI 30.40 kg/m   Physical Exam Vitals and nursing note reviewed.  Constitutional:      General: He is not in acute distress.    Appearance: Normal appearance. He is not ill-appearing, toxic-appearing or diaphoretic.  HENT:     Head: Normocephalic and atraumatic.     Right Ear: Tympanic membrane, ear canal and external ear normal. There is no impacted cerumen.     Left Ear: Tympanic membrane, ear canal and external ear normal. There is no impacted cerumen.     Nose:  Congestion and rhinorrhea present.     Mouth/Throat:     Mouth: Mucous membranes are moist.     Pharynx: Oropharynx is clear. No oropharyngeal exudate or posterior oropharyngeal erythema.  Eyes:     General: No scleral icterus.       Right eye: No discharge.        Left eye: No discharge.     Extraocular Movements: Extraocular movements intact.     Pupils: Pupils are equal, round, and reactive to light.  Neck:     Thyroid: No thyroid mass, thyromegaly or thyroid tenderness.  Cardiovascular:     Rate and Rhythm: Normal rate and regular rhythm.     Pulses: Normal pulses.     Heart sounds: Murmur (faint) heard.  Pulmonary:     Effort: Pulmonary effort is normal. No respiratory distress.     Breath sounds: Normal breath sounds. No stridor. No wheezing or rhonchi.  Abdominal:     General: Abdomen is flat. Bowel sounds are normal. There is no distension.     Palpations: Abdomen is soft. There is no mass.     Tenderness: There is no abdominal tenderness. There is no guarding.  Musculoskeletal:     Cervical back: Normal range of motion and neck supple. No rigidity or tenderness.     Right lower leg: No edema.      Left lower leg: No edema.  Lymphadenopathy:     Cervical: No cervical adenopathy.  Skin:    General: Skin is warm and dry.     Coloration: Skin is not jaundiced.     Findings: No bruising, erythema or rash.  Neurological:     General: No focal deficit present.     Mental Status: He is alert and oriented to person, place, and time.     Sensory: No sensory deficit.     Motor: No weakness.  Psychiatric:        Mood and Affect: Mood normal.        Behavior: Behavior normal.       Assessment & Plan:  Human immunodeficiency virus (HIV) disease (HCC)  Acute renal failure superimposed on stage 3b chronic kidney disease, unspecified acute renal failure type [facility]) Assessment & Plan: Renal Insufficiency History of renal insufficiency, possibly secondary to hypertension. Creatinine was 3.11 during recent hospitalization. -Repeat BMP today to assess current renal function.  Orders: -     CBC with Differential/Platelet -     TSH -     Comprehensive metabolic panel  Positive hepatitis C antibody test Assessment & Plan: -Check Hepatitis C status, as there is a history of positive result but recent tests were negative.  Orders: -     Hepatitis C antibody  Normocytic anemia -     CBC with Differential/Platelet -     Comprehensive metabolic panel  Acute systolic heart failure (HCC) Assessment & Plan: Congestive Heart Failure Recent hospitalization for new onset heart failure with reduced ejection fraction (EF <20%). Patient reports significant improvement in symptoms with medication and has resumed physical activity including gym workouts. No device therapy initiated during hospitalization. -Continue current medications as prescribed by cardiology. -Cardiology follow-up scheduled for 11/16/2023 to reassess ejection fraction and overall cardiac status.  Orders: -     DG Chest 2 View; Future  Nasal congestion with rhinorrhea Assessment & Plan: Nasal Congestion Reports of nasal  congestion and feeling "nasally." No other symptoms of upper respiratory infection. Possible sleep apnea suggested. -Prescribe Dymista nasal spray. -Order sleep  study to evaluate for sleep apnea.  Orders: -     Azelastine-Fluticasone; Place 1 spray into the nose every 12 (twelve) hours.  Dispense: 23 g; Refill: 2 -     Ambulatory referral to Sleep Studies  Snoring -     Ambulatory referral to Sleep Studies  Hypertensive emergency Assessment & Plan: Hypertension History of poorly controlled blood pressure, likely contributing to heart failure. Current home readings in the 130s systolic. -Continue current antihypertensive medications. -Monitor blood pressure at home.     Return in about 2 weeks (around 11/16/2023).   Maretta Bees, PA

## 2023-11-02 NOTE — Assessment & Plan Note (Signed)
Nasal Congestion Reports of nasal congestion and feeling "nasally." No other symptoms of upper respiratory infection. Possible sleep apnea suggested. -Prescribe Dymista nasal spray. -Order sleep study to evaluate for sleep apnea.

## 2023-11-02 NOTE — Assessment & Plan Note (Signed)
Hypertension History of poorly controlled blood pressure, likely contributing to heart failure. Current home readings in the 130s systolic. -Continue current antihypertensive medications. -Monitor blood pressure at home.

## 2023-11-02 NOTE — Assessment & Plan Note (Signed)
Renal Insufficiency History of renal insufficiency, possibly secondary to hypertension. Creatinine was 3.11 during recent hospitalization. -Repeat BMP today to assess current renal function.

## 2023-11-02 NOTE — Assessment & Plan Note (Signed)
Congestive Heart Failure Recent hospitalization for new onset heart failure with reduced ejection fraction (EF <20%). Patient reports significant improvement in symptoms with medication and has resumed physical activity including gym workouts. No device therapy initiated during hospitalization. -Continue current medications as prescribed by cardiology. -Cardiology follow-up scheduled for 11/16/2023 to reassess ejection fraction and overall cardiac status.

## 2023-11-02 NOTE — Patient Instructions (Addendum)
Please sign up for Mychart.  Go to [facility] for a chest xray today (after 1pm, before 4pm): [address] [place], [place] [address] Phone: [phone]  I will complete your FMLA paperwork within the next 72 hours.  Please use the Dymista nasal spray one spray twice daily. If it is not covered by insurance, you can purchase flonase over the counter.  I have placed a referral for sleep medicine to obtain a sleep study.  We drew your labs today. Keep your appointment with cardiology for follow up.  Return for a recheck in 2 weeks. Continue to monitor your blood pressure at home.

## 2023-11-02 NOTE — Assessment & Plan Note (Signed)
-  Check Hepatitis C status, as there is a history of positive result but recent tests were negative.

## 2023-11-03 LAB — HEPATITIS C ANTIBODY: Hepatitis C Ab: NONREACTIVE

## 2023-11-05 ENCOUNTER — Telehealth: Payer: Self-pay

## 2023-11-05 DIAGNOSIS — Z0279 Encounter for issue of other medical certificate: Secondary | ICD-10-CM

## 2023-11-05 NOTE — Telephone Encounter (Signed)
Called to patient to let him know disability papers are completed and ready for pick up.

## 2023-11-09 DIAGNOSIS — N182 Chronic kidney disease, stage 2 (mild): Secondary | ICD-10-CM | POA: Diagnosis not present

## 2023-11-09 DIAGNOSIS — I129 Hypertensive chronic kidney disease with stage 1 through stage 4 chronic kidney disease, or unspecified chronic kidney disease: Secondary | ICD-10-CM | POA: Diagnosis not present

## 2023-11-09 DIAGNOSIS — I119 Hypertensive heart disease without heart failure: Secondary | ICD-10-CM | POA: Diagnosis not present

## 2023-11-09 DIAGNOSIS — I43 Cardiomyopathy in diseases classified elsewhere: Secondary | ICD-10-CM | POA: Diagnosis not present

## 2023-11-11 ENCOUNTER — Encounter (HOSPITAL_COMMUNITY): Payer: BC Managed Care – PPO | Admitting: Cardiology

## 2023-11-16 ENCOUNTER — Encounter (HOSPITAL_COMMUNITY): Payer: BC Managed Care – PPO | Admitting: Cardiology

## 2023-11-24 ENCOUNTER — Ambulatory Visit: Payer: BC Managed Care – PPO | Admitting: Family

## 2023-11-25 ENCOUNTER — Other Ambulatory Visit: Payer: Self-pay | Admitting: Urgent Care

## 2023-11-25 NOTE — Telephone Encounter (Signed)
 Copied from CRM [id number]. Topic: Clinical - Medication Refill >> Nov 25, 2023  2:50 PM Corin V wrote: Most Recent Primary Care Visit:  Provider: Maretta Bees  Department: [facility]  Visit Type: NEW PT - OFFICE VISIT  Date: 11/02/2023  Medication: doxazosin (CARDURA) 4 MG tablet amLODipine (NORVASC) 10 MG tablet  Has the patient contacted their pharmacy? Yes (Agent: If no, request that the patient contact the pharmacy for the refill. If patient does not wish to contact the pharmacy document the reason why and proceed with request.) (Agent: If yes, when and what did the pharmacy advise?)  Is this the correct pharmacy for this prescription? Yes If no, delete pharmacy and type the correct one.  This is the patient's preferred pharmacy:  [id number] - [place], [place] - [address] AT [facility] OF [address] [place] [address] Phone: [phone] Fax: [phone]   Has the prescription been filled recently? No  Is the patient out of the medication? Yes  Has the patient been seen for an appointment in the last year OR does the patient have an upcoming appointment? Yes  Can we respond through MyChart? No  Agent: Please be advised that Rx refills may take up to 3 business days. We ask that you follow-up with your pharmacy.

## 2023-11-25 NOTE — Telephone Encounter (Signed)
 Last OV: 10/23/23 Refills left: 0 Last refill: no pharmacy receipt of order received  Patient is out of medication

## 2023-11-30 ENCOUNTER — Other Ambulatory Visit: Payer: Self-pay | Admitting: Urgent Care

## 2023-11-30 ENCOUNTER — Telehealth: Payer: Self-pay | Admitting: Neurology

## 2023-11-30 NOTE — Telephone Encounter (Signed)
 Last Fill: Cardura: 10/21/23 30 tabs/0 refills     Norvasc: 10/21/23 30 tabs/0 refills  Last OV: 11/02/23 Next OV: None Scheduled  Routing to provider for review/authorization.

## 2023-11-30 NOTE — Telephone Encounter (Signed)
 Pt called to verify appointment

## 2023-11-30 NOTE — Telephone Encounter (Signed)
 It looks like this med is prescribed by his cardiologist. It also looks like patient never scheduled his follow up with me. Can we get him back in to see me in a month or so please

## 2023-11-30 NOTE — Telephone Encounter (Signed)
 Copied from CRM [id number]. Topic: Clinical - Medication Refill >> Nov 30, 2023 10:09 AM Dollene Primrose wrote: Most Recent Primary Care Visit:  Provider: Maretta Bees  Department: [facility]  Visit Type: NEW PT - OFFICE VISIT  Date: 11/02/2023  Medication: doxazosin (CARDURA) 4 MG tablet amLODipine (NORVASC) 10 MG tablet   Has the patient contacted their pharmacy? Yes-did not recieve (Agent: If no, request that the patient contact the pharmacy for the refill. If patient does not wish to contact the pharmacy document the reason why and proceed with request.) (Agent: If yes, when and what did the pharmacy advise?)  Is this the correct pharmacy for this prescription? yes If no, delete pharmacy and type the correct one.  This is the patient's preferred pharmacy:  [facility] #[id number] - [place], [place] - [address] AT [facility] OF [address] [place] [address] Phone: [phone] Fax: [phone]   Has the prescription been filled recently? no  Is the patient out of the medication? yes  Has the patient been seen for an appointment in the last year OR does the patient have an upcoming appointment? yes  Can we respond through MyChart? yes  Agent: Please be advised that Rx refills may take up to 3 business days. We ask that you follow-up with your pharmacy.

## 2023-12-03 ENCOUNTER — Other Ambulatory Visit: Payer: Self-pay

## 2023-12-03 ENCOUNTER — Other Ambulatory Visit (HOSPITAL_COMMUNITY): Payer: Self-pay

## 2023-12-03 ENCOUNTER — Ambulatory Visit: Payer: BC Managed Care – PPO | Admitting: Internal Medicine

## 2023-12-03 NOTE — Progress Notes (Signed)
 Specialty Pharmacy Refill Coordination Note  [name] is a 38 y.o. male assessed today regarding refills of clinic administered specialty medication(s) Cabotegravir & Rilpivirine [facility])   Clinic requested Courier to Provider Office   Delivery date: 12/16/23   Verified address: [address] [place] [address]   Medication will be filled on 12/15/23.

## 2023-12-07 ENCOUNTER — Ambulatory Visit (INDEPENDENT_AMBULATORY_CARE_PROVIDER_SITE_OTHER): Payer: BC Managed Care – PPO | Admitting: Neurology

## 2023-12-07 ENCOUNTER — Encounter: Payer: Self-pay | Admitting: Neurology

## 2023-12-07 VITALS — BP 156/120 | HR 90

## 2023-12-07 DIAGNOSIS — R0683 Snoring: Secondary | ICD-10-CM

## 2023-12-07 DIAGNOSIS — I5022 Chronic systolic (congestive) heart failure: Secondary | ICD-10-CM

## 2023-12-07 DIAGNOSIS — J810 Acute pulmonary edema: Secondary | ICD-10-CM

## 2023-12-07 DIAGNOSIS — R0681 Apnea, not elsewhere classified: Secondary | ICD-10-CM

## 2023-12-07 DIAGNOSIS — Z9189 Other specified personal risk factors, not elsewhere classified: Secondary | ICD-10-CM

## 2023-12-07 DIAGNOSIS — R030 Elevated blood-pressure reading, without diagnosis of hypertension: Secondary | ICD-10-CM

## 2023-12-07 DIAGNOSIS — R351 Nocturia: Secondary | ICD-10-CM

## 2023-12-07 DIAGNOSIS — J343 Hypertrophy of nasal turbinates: Secondary | ICD-10-CM

## 2023-12-07 NOTE — Progress Notes (Signed)
 Subjective:    Patient ID: [name] is a 38 y.o. male.  HPI    Cameron Foley, MD, PhD [facility] [address], [address] [address] [place], [place] [address]  Dear [name],  I saw your patient, [name], upon your kind request in my sleep clinic today for initial consultation of his sleep disorder, in particular, concern for underlying obstructive sleep apnea.  The patient is unaccompanied today.  As you know, [name] is a 38 year old male with an underlying medical history of anemia, chronic systolic congestive heart failure, hypertension with history of hypertensive emergency, chronic kidney disease with recent acute renal failure, HIV disease, hepatitis C, ITP, and mild obesity, who reports snoring and excessive daytime somnolence as well as witnessed apneas and sleep disruption.  His Epworth sleepiness score is 6 out of 24, fatigue severity score is 19 out of 63.  I reviewed your office note from 11/02/2023.  He was told that he has pauses in his breathing while asleep.  He has occasionally woken up with a sense of gasping for air.  He no longer has morning headaches after starting blood pressure medication consistently.  He is compliant with his blood pressure medications and is taking multiple meds at this time.  He has nocturia about once per average night.  He lives alone and has no children, no pets in the household.  Bedtime is between 10 and 11 and rise time between 6:30 AM and 7 AM.  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  He is not aware of any family history of sleep apnea.  He is a non-smoker of cigarettes and stopped smoking marijuana about a year ago.  He drinks alcohol rarely, he does not drink caffeine daily.  His Past Medical History Is Significant For: Past Medical History:  Diagnosis Date   HIV (human immunodeficiency virus infection) (HCC)    ITP (idiopathic thrombocytopenic purpura) 11/24/2013    His Past Surgical  History Is Significant For: Past Surgical History:  Procedure Laterality Date   ADENOIDECTOMY N/A 11/16/2013   Procedure: ADENOIDECTOMY;  Surgeon: Flo Shanks, MD;  Location: [facility] OR;  Service: ENT;  Laterality: N/A;   MYRINGOTOMY WITH TUBE PLACEMENT Bilateral 11/16/2013   Procedure: MYRINGOTOMY WITH TUBE PLACEMENT;  Surgeon: Flo Shanks, MD;  Location: [facility] OR;  Service: ENT;  Laterality: Bilateral;   NASAL ENDOSCOPY WITH EPISTAXIS CONTROL N/A 11/16/2013   Procedure: NASAL ENDOSCOPY with nasopharyngeal biopsy with frozen section and adnoidectomy;  Surgeon: Flo Shanks, MD;  Location: [facility] OR;  Service: ENT;  Laterality: N/A;   RIGHT HEART CATH N/A 10/19/2023   Procedure: RIGHT HEART CATH;  Surgeon: Laurey Morale, MD;  Location: [facility] INVASIVE CV LAB;  Service: Cardiovascular;  Laterality: N/A;    His Family History Is Significant For: Family History  Problem Relation Age of Onset   Hypertension Neg Hx    Diabetes Neg Hx     His Social History Is Significant For: Social History   Socioeconomic History   Marital status: Single    Spouse name: Not on file   Number of children: Not on file   Years of education: Not on file   Highest education level: Not on file  Occupational History   Not on file  Tobacco Use   Smoking status: Former    Current packs/day: 0.00    Average packs/day: 0.1 packs/day for 12.5 years (1.2 ttl pk-yrs)    Types: Cigarettes    Start date:  12/30/2001    Quit date: 06/26/2014    Years since quitting: 9.4   Smokeless tobacco: Never   Tobacco comments:    pt states he wants to quit by his birthday in July  Vaping Use   Vaping status: Never Used  Substance and Sexual Activity   Alcohol use: No   Drug use: No   Sexual activity: Not Currently    Partners: Female, Male    Birth control/protection: Condom    Comment: declined condoms  Other Topics Concern   Not on file  Social History Narrative   Caffiene none   Working :  [employer]   Live alone, no pets.     Social Drivers of Corporate investment banker Strain: Not on file  Food Insecurity: No Food Insecurity (10/16/2023)   Hunger Vital Sign    Worried About Running Out of Food in the Last Year: Never true    Ran Out of Food in the Last Year: Never true  Transportation Needs: No Transportation Needs (10/16/2023)   PRAPARE - Administrator, Civil Service (Medical): No    Lack of Transportation (Non-Medical): No  Physical Activity: Not on file  Stress: Not on file  Social Connections: Not on file    His Allergies Are:  No Known Allergies:   His Current Medications Are:  Outpatient Encounter Medications as of 12/07/2023  Medication Sig   acetaminophen (TYLENOL) 500 MG tablet Take 1,000 mg by mouth every 6 (six) hours as needed for mild pain (pain score 1-3), fever or headache.   amLODipine (NORVASC) 10 MG tablet Take 1 tablet (10 mg total) by mouth daily.   Azelastine-Fluticasone 137-50 MCG/ACT SUSP Place 1 spray into the nose every 12 (twelve) hours.   cabotegravir & rilpivirine ER (CABENUVA) 600 & 900 MG/3ML injection Inject 1 kit into the muscle every 2 (two) months.   carvedilol (COREG) 3.125 MG tablet Take 1 tablet (3.125 mg total) by mouth 2 (two) times daily.   doxazosin (CARDURA) 4 MG tablet Take 1 tablet (4 mg total) by mouth daily.   isosorbide-hydrALAZINE (BIDIL) 20-37.5 MG tablet Take 2 tablets by mouth 3 (three) times daily.   torsemide (DEMADEX) 20 MG tablet Take 2 tablets (40mg ) daily if needed for weight gain above 3 pounds from your baseline weight in 24 hours or 5 pounds above your dry weight in a week, check weight daily   No facility-administered encounter medications on file as of 12/07/2023.  :   Review of Systems:  Out of a complete 14 point review of systems, all are reviewed and negative with the exception of these symptoms as listed below:   Review of Systems  Neurological:        Internal / snoring, hypertension, congestive heart failure. Snoring,  No morning headaches,  no apnea.  ESS 6 FSS 19.    Objective:  Neurological Exam  Physical Exam Physical Examination:   Vitals:   12/07/23 0927  BP: (!) 185/139  Pulse: 100   Upon recheck, his blood pressure at the end of the visit was 156/120.  Pulse was 90.  He denied any chest pain, shortness of breath, head pressure or blurry vision.  General Examination: The patient is a very pleasant 38 y.o. male in no acute distress. He appears well-developed and well-nourished and well groomed.   HEENT: Normocephalic, atraumatic, pupils are equal, round and reactive to light, extraocular tracking is good without limitation to gaze excursion or nystagmus noted. Hearing is grossly  intact. Face is symmetric with normal facial animation. Speech is clear with no dysarthria noted. There is no hypophonia. There is no lip, neck/head, jaw or voice tremor. Neck is supple with full range of passive and active motion. There are no carotid bruits on auscultation. Oropharynx exam reveals: mild mouth dryness, good dental hygiene and mild airway crowding, due to U3, tonsils on the smaller side, Mallampati class II, slightly wider uvula.  Mild to moderate overbite noted.  Tongue protrudes centrally and palate elevates symmetrically, neck circumference 16 1/8 inches.  Nasal inspection reveals bilateral inferior turbinate hypertrophy.  Chest: Clear to auscultation without wheezing, rhonchi or crackles noted.  Heart: S1+S2+0, regular and normal without murmurs, rubs or gallops noted.   Abdomen: Soft, non-tender and non-distended.  Extremities: There is no pitting edema in the distal lower extremities bilaterally.   Skin: Warm and dry without trophic changes noted.   Musculoskeletal: exam reveals no obvious joint deformities.   Neurologically:  Mental status: The patient is awake, alert and oriented in all 4 spheres. His immediate and remote memory, attention, language skills and fund of knowledge are  appropriate. There is no evidence of aphasia, agnosia, apraxia or anomia. Speech is clear with normal prosody and enunciation. Thought process is linear. Mood is normal and affect is normal.  Cranial nerves II - XII are as described above under HEENT exam.  Motor exam: Normal bulk, strength and tone is noted. There is no obvious action or resting tremor.  Fine motor skills and coordination: grossly intact.  Cerebellar testing: No dysmetria or intention tremor. There is no truncal or gait ataxia.  Sensory exam: intact to light touch in the upper and lower extremities.  Gait, station and balance: He stands easily. No veering to one side is noted. No leaning to one side is noted. Posture is age-appropriate and stance is narrow based. Gait shows normal stride length and normal pace. No problems turning are noted.   Assessment and Plan:  In summary, [name] is a very pleasant 38 y.o.-year old male with an underlying medical history of anemia, chronic systolic congestive heart failure, hypertension with history of hypertensive emergency, chronic kidney disease with recent acute renal failure, HIV disease, hepatitis C, ITP, and mild obesity, whose history and physical exam are concerning for sleep disordered breathing, particularly obstructive sleep apnea (OSA). A laboratory attended sleep study is typically considered "gold standard" for evaluation of sleep disordered breathing.   I had a long chat with the patient about my findings and the diagnosis of sleep apnea, particularly OSA, its prognosis and treatment options. We talked about medical/conservative treatments, surgical interventions and non-pharmacological approaches for symptom control. I explained, in particular, the risks and ramifications of untreated moderate to severe OSA, especially with respect to developing cardiovascular disease down the road, including congestive heart failure (CHF), difficult to treat hypertension, cardiac arrhythmias  (particularly A-fib), neurovascular complications including TIA, stroke and dementia. Even type 2 diabetes has, in part, been linked to untreated OSA. Symptoms of untreated OSA may include (but may not be limited to) daytime sleepiness, nocturia (i.e. frequent nighttime urination), memory problems, mood irritability and suboptimally controlled or worsening mood disorder such as depression and/or anxiety, lack of energy, lack of motivation, physical discomfort, as well as recurrent headaches, especially morning or nocturnal headaches. We talked about the importance of maintaining a healthy lifestyle and striving for healthy weight. In addition, we talked about the importance of striving for and maintaining good sleep hygiene. I recommended a  sleep study at this time. I outlined the differences between a laboratory attended sleep study which is considered more comprehensive and accurate over the option of a home sleep test (HST); the latter may lead to underestimation of sleep disordered breathing in some instances and does not help with diagnosing upper airway resistance syndrome and is not accurate enough to diagnose primary central sleep apnea typically. I outlined possible surgical and non-surgical treatment options of OSA, including the use of a positive airway pressure (PAP) device (i.e. CPAP, AutoPAP/APAP or BiPAP in certain circumstances), a custom-made dental device (aka oral appliance, which would require a referral to a specialist dentist or orthodontist typically, and is generally speaking not considered for patients with full dentures or edentulous state), upper airway surgical options, such as traditional UPPP (which is not considered a first-line treatment) or the Inspire device (hypoglossal nerve stimulator, which would involve a referral for consultation with an ENT surgeon, after careful selection, following inclusion criteria - also not first-line treatment). I explained the PAP treatment option  to the patient in detail, as this is generally considered first-line treatment.  The patient indicated that he would be willing to try PAP therapy, if the need arises. I explained the importance of being compliant with PAP treatment, not only for insurance purposes but primarily to improve patient's symptoms symptoms, and for the patient's long term health benefit, including to reduce His cardiovascular risks longer-term.    We will pick up our discussion about the next steps and treatment options after testing.  We will keep him posted as to the test results by phone call and/or MyChart messaging where possible.  We will plan to follow-up in sleep clinic accordingly as well.  I answered all his questions today and the patient was in agreement.   I encouraged him to call with any interim questions, concerns, problems or updates or email [place] through MyChart.  Generally speaking, sleep test authorizations may take up to 2 weeks, sometimes less, sometimes longer, the patient is encouraged to get in touch with [place] if they do not hear back from the sleep lab staff directly within the next 2 weeks.  Thank you very much for allowing me to participate in the care of this nice patient. If I can be of any further assistance to you please do not hesitate to call me at [phone].  Sincerely,   Cameron Foley, MD, PhD

## 2023-12-07 NOTE — Patient Instructions (Signed)

## 2023-12-14 ENCOUNTER — Ambulatory Visit (INDEPENDENT_AMBULATORY_CARE_PROVIDER_SITE_OTHER): Admitting: Urgent Care

## 2023-12-14 ENCOUNTER — Ambulatory Visit: Payer: BC Managed Care – PPO | Admitting: Urgent Care

## 2023-12-14 VITALS — BP 153/110 | HR 86 | Wt 232.4 lb

## 2023-12-14 DIAGNOSIS — N184 Chronic kidney disease, stage 4 (severe): Secondary | ICD-10-CM | POA: Insufficient documentation

## 2023-12-14 DIAGNOSIS — I10 Essential (primary) hypertension: Secondary | ICD-10-CM | POA: Diagnosis not present

## 2023-12-14 DIAGNOSIS — R7989 Other specified abnormal findings of blood chemistry: Secondary | ICD-10-CM

## 2023-12-14 DIAGNOSIS — I5022 Chronic systolic (congestive) heart failure: Secondary | ICD-10-CM

## 2023-12-14 MED ORDER — ISOSORB DINITRATE-HYDRALAZINE 20-37.5 MG PO TABS: 2.0000 | ORAL_TABLET | Freq: Three times a day (TID) | ORAL | 1 refills | Status: DC

## 2023-12-14 MED ORDER — DOXAZOSIN MESYLATE 4 MG PO TABS: 4.0000 mg | ORAL_TABLET | Freq: Every day | ORAL | 1 refills | Status: DC

## 2023-12-14 MED ORDER — CARVEDILOL 3.125 MG PO TABS
3.1250 mg | ORAL_TABLET | Freq: Two times a day (BID) | ORAL | 1 refills | Status: DC
Start: 2023-12-14 — End: 2023-12-28

## 2023-12-14 MED ORDER — AMLODIPINE BESYLATE 10 MG PO TABS
10.0000 mg | ORAL_TABLET | Freq: Every day | ORAL | 1 refills | Status: DC
Start: 2023-12-14 — End: 2024-05-04

## 2023-12-14 MED ORDER — TORSEMIDE 20 MG PO TABS: ORAL_TABLET | ORAL | 1 refills | Status: DC

## 2023-12-14 NOTE — Patient Instructions (Addendum)
 Please call [facility] kidney and schedule appointment ASAP [address] [place], [place] [address] Phone: [phone]   Please also call cardiology to schedule your follow up: [facility] [address] [place], [place] [address] [phone]  I have refilled your medications. Please restart them today. Monitor for improvement in your symptoms.  Please return to see me in 6 months, sooner as needed.

## 2023-12-14 NOTE — Progress Notes (Unsigned)
   Established Patient Office Visit  Subjective:  Patient ID: [name], male    DOB: 1986-07-04  Age: 38 y.o. MRN: [id number]  Chief Complaint  Patient presents with   Follow-up    Follow up from last visit. Pt will need refills.    HPI  {History (Optional):23778}  ROS: as noted in HPI  Objective:     BP (!) 153/110   Pulse 86   Wt 232 lb 6.4 oz (105.4 kg)   SpO2 97%   BMI 30.66 kg/m  {Vitals History (Optional):23777}  Physical Exam   No results found for any visits on 12/14/23.  {Labs (Optional):23779}  The ASCVD Risk score (Arnett DK, et al., 2019) failed to calculate for the following reasons:   The 2019 ASCVD risk score is only valid for ages 54 to 83  Assessment & Plan:  Stage 4 chronic kidney disease (HCC) -     Comprehensive metabolic panel -     CBC with Differential/Platelet -     Magnesium  Essential hypertension -     amLODIPine Besylate; Take 1 tablet (10 mg total) by mouth daily.  Dispense: 90 tablet; Refill: 1 -     Carvedilol; Take 1 tablet (3.125 mg total) by mouth 2 (two) times daily.  Dispense: 180 tablet; Refill: 1 -     Doxazosin Mesylate; Take 1 tablet (4 mg total) by mouth daily.  Dispense: 90 tablet; Refill: 1 -     Isosorb Dinitrate-hydrALAZINE; Take 2 tablets by mouth 3 (three) times daily.  Dispense: 540 tablet; Refill: 1 -     Torsemide; Take 2 tablets (40mg ) daily if needed for weight gain above 3 pounds from your baseline weight in 24 hours or 5 pounds above your dry weight in a week, check weight daily  Dispense: 180 tablet; Refill: 1 -     Comprehensive metabolic panel -     CBC with Differential/Platelet  Chronic systolic congestive heart failure (HCC) -     amLODIPine Besylate; Take 1 tablet (10 mg total) by mouth daily.  Dispense: 90 tablet; Refill: 1 -     Carvedilol; Take 1 tablet (3.125 mg total) by mouth 2 (two) times daily.  Dispense: 180 tablet; Refill: 1 -     Doxazosin Mesylate; Take 1 tablet (4 mg total) by mouth  daily.  Dispense: 90 tablet; Refill: 1 -     Isosorb Dinitrate-hydrALAZINE; Take 2 tablets by mouth 3 (three) times daily.  Dispense: 540 tablet; Refill: 1 -     Torsemide; Take 2 tablets (40mg ) daily if needed for weight gain above 3 pounds from your baseline weight in 24 hours or 5 pounds above your dry weight in a week, check weight daily  Dispense: 180 tablet; Refill: 1  High serum phosphorus for age -     Phosphorus     Return in about 6 months (around 06/15/2024).   Maretta Bees, PA

## 2023-12-15 ENCOUNTER — Encounter: Payer: Self-pay | Admitting: Urgent Care

## 2023-12-15 LAB — PHOSPHORUS: Phosphorus: 3.1 mg/dL (ref 2.3–4.6)

## 2023-12-15 LAB — CBC WITH DIFFERENTIAL/PLATELET
Basophils Absolute: 0.1 10*3/uL (ref 0.0–0.1)
Basophils Relative: 0.8 % (ref 0.0–3.0)
Eosinophils Absolute: 0.1 10*3/uL (ref 0.0–0.7)
Eosinophils Relative: 0.9 % (ref 0.0–5.0)
HCT: 40.3 % (ref 39.0–52.0)
Hemoglobin: 13.2 g/dL (ref 13.0–17.0)
Lymphocytes Relative: 28.1 % (ref 12.0–46.0)
Lymphs Abs: 1.9 10*3/uL (ref 0.7–4.0)
MCHC: 32.8 g/dL (ref 30.0–36.0)
MCV: 87.2 fl (ref 78.0–100.0)
Monocytes Absolute: 0.9 10*3/uL (ref 0.1–1.0)
Monocytes Relative: 13.3 % — ABNORMAL HIGH (ref 3.0–12.0)
Neutro Abs: 3.8 10*3/uL (ref 1.4–7.7)
Neutrophils Relative %: 56.9 % (ref 43.0–77.0)
Platelets: 156 10*3/uL (ref 150.0–400.0)
RBC: 4.62 Mil/uL (ref 4.22–5.81)
RDW: 13.3 % (ref 11.5–15.5)
WBC: 6.7 10*3/uL (ref 4.0–10.5)

## 2023-12-15 LAB — COMPREHENSIVE METABOLIC PANEL
ALT: 21 U/L (ref 0–53)
AST: 25 U/L (ref 0–37)
Albumin: 3.5 g/dL (ref 3.5–5.2)
Alkaline Phosphatase: 82 U/L (ref 39–117)
BUN: 30 mg/dL — ABNORMAL HIGH (ref 6–23)
CO2: 26 meq/L (ref 19–32)
Calcium: 8.7 mg/dL (ref 8.4–10.5)
Chloride: 106 meq/L (ref 96–112)
Creatinine, Ser: 3.58 mg/dL — ABNORMAL HIGH (ref 0.40–1.50)
GFR: 20.5 mL/min — ABNORMAL LOW (ref >60.00)
Glucose, Bld: 85 mg/dL (ref 70–99)
Potassium: 4.6 meq/L (ref 3.5–5.1)
Sodium: 139 meq/L (ref 135–145)
Total Bilirubin: 0.9 mg/dL (ref 0.2–1.2)
Total Protein: 6.6 g/dL (ref 6.0–8.3)

## 2023-12-15 LAB — MAGNESIUM: Magnesium: 1.3 mg/dL — ABNORMAL LOW (ref 1.5–2.5)

## 2023-12-16 ENCOUNTER — Telehealth: Payer: Self-pay

## 2023-12-16 ENCOUNTER — Telehealth: Payer: Self-pay | Admitting: Neurology

## 2023-12-16 NOTE — Telephone Encounter (Signed)
 noted

## 2023-12-16 NOTE — Telephone Encounter (Signed)
 LVM for pt to call back to schedule    Yetta Numbers: [id number] (exp. 12/10/23 to 02/07/24)

## 2023-12-16 NOTE — Telephone Encounter (Signed)
 [facility] Patient Advocate Encounter  Patient's medications CABENUVA have been couriered to [facility] from [facility] and will be administered at the patients appointment on 12/21/23.  Kae Heller, CPhT Specialty Pharmacy Patient [facility] Phone: [phone] Fax:  [phone]

## 2023-12-16 NOTE — Telephone Encounter (Signed)
 Reason for CRM: pt called to send a msg to Vickie Epley , he stated he has an appt with [facility] on 12/21/23

## 2023-12-16 NOTE — Progress Notes (Signed)
 HPI: [name] is a 38 y.o. male who presents to the [facility] pharmacy clinic for [place] administration.  Patient Active Problem List   Diagnosis Date Noted   Chronic systolic congestive heart failure (HCC) 12/14/2023   Essential hypertension 12/14/2023   High serum phosphorus for age 75/07/2024   Stage 4 chronic kidney disease (HCC) 12/14/2023   Nasal congestion with rhinorrhea 11/02/2023   Snoring 11/02/2023   Need for meningococcal vaccination 10/23/2023   Medication monitoring encounter 10/21/2023   Acute pulmonary edema (HCC) 10/19/2023   Anasarca 10/16/2023   Hypertensive emergency 10/16/2023   Acute heart failure (HCC) 10/16/2023   Acute renal failure superimposed on stage 3b chronic kidney disease (HCC) 10/16/2023   Cough 10/16/2023   SIRS (systemic inflammatory response syndrome) (HCC) 10/16/2023   Screening examination for venereal disease 04/08/2023   Rectal pain 04/08/2023   Encounter for long-term (current) use of medications 04/08/2023   Latent syphilis 11/11/2022   Nasal septal deviation 04/05/2020   Renal insufficiency 06/15/2018   Healthcare maintenance 11/04/2017   Depression 10/27/2017   Former smoker 12/08/2013   Positive hepatitis C antibody test 11/18/2013   Thrombocytopenia, unspecified (HCC) 11/17/2013   Human immunodeficiency virus (HIV) disease (HCC) 11/17/2013   Epistaxis 11/17/2013   Normocytic anemia 11/17/2013   Chronic otitis media 11/17/2013   Transaminitis 11/17/2013   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   AMLODIPINE (NORVASC) 10 MG TABLET    Take 1 tablet (10 mg total) by mouth daily.   AZELASTINE-FLUTICASONE 137-50 MCG/ACT SUSP    Place 1 spray into the nose every 12 (twelve) hours.   CABOTEGRAVIR &  RILPIVIRINE ER (CABENUVA) 600 & 900 MG/3ML INJECTION    Inject 1 kit into the muscle every 2 (two) months.   CARVEDILOL (COREG) 3.125 MG TABLET    Take 1 tablet (3.125 mg total) by mouth 2 (two) times daily.   DOXAZOSIN (CARDURA) 4 MG TABLET    Take 1 tablet (4 mg total) by mouth daily.   ISOSORBIDE-HYDRALAZINE (BIDIL) 20-37.5 MG TABLET    Take 2 tablets by mouth 3 (three) times daily.   TORSEMIDE (DEMADEX) 20 MG TABLET    Take 2 tablets (40mg ) daily if needed for weight gain above 3 pounds from your baseline weight in 24 hours or 5 pounds above your dry weight in a week, check weight daily  Modified Medications   No medications on file  Discontinued Medications   No medications on file    Allergies: No Known Allergies  Labs: Lab Results  Component Value Date   HIV1RNAQUANT NOT DETECTED 10/21/2023   HIV1RNAQUANT Not Detected 08/26/2023   HIV1RNAQUANT Not Detected 06/24/2023   CD4TABS 509 12/03/2022   CD4TABS 383 (L) 06/27/2021   CD4TABS 417 03/26/2020    RPR and STI Lab Results  Component Value Date   LABRPR REACTIVE (A) 10/21/2023   LABRPR REACTIVE (A) 04/08/2023   LABRPR REACTIVE (A) 12/03/2022   LABRPR REACTIVE (A) 06/27/2021   LABRPR NON-REACTIVE 03/26/2020   RPRTITER 1:1 (H) 10/21/2023   RPRTITER 1:1 (H) 04/08/2023   RPRTITER 1:2 (H) 12/03/2022   RPRTITER 1:2 (H) 06/27/2021    STI Results GC GC CT CT  Latest Ref Rng & Units  NEGATIVE  NEGATIVE  10/21/2023 10:52 AM Negative    Negative    Negative   Negative    Negative    Negative    04/08/2023 10:25 AM Negative    Negative   Negative    Negative    04/02/2023  8:52 AM Negative   Negative    09/01/2018 12:00 AM Negative   Negative    04/22/2017 12:00 AM Negative   Negative    11/17/2013  2:00 PM  NEGATIVE   NEGATIVE     Hepatitis B Lab Results  Component Value Date   HEPBSAB POSITIVE (A) 11/17/2013   HEPBSAG NEGATIVE 11/18/2013   Hepatitis C Lab Results  Component Value Date   HEPCAB NON-REACTIVE  11/02/2023   Hepatitis A Lab Results  Component Value Date   HAV Reactive (A) 11/17/2013   Lipids: Lab Results  Component Value Date   CHOL 202 (H) 04/08/2023   TRIG 126 04/08/2023   HDL 47 04/08/2023   CHOLHDL 4.3 04/08/2023   VLDL 40 (H) 04/22/2017   LDLCALC 131 (H) 04/08/2023    TARGET DATE: The 21st  Assessment: [name] presents today for his maintenance Cabenuva injections. Past injections were tolerated well without issues. Last HIV RNA was not detected in January. Will defer labs today.   Administered cabotegravir 600mg /34mL in left upper outer quadrant of the gluteal muscle. Administered rilpivirine 900 mg/72mL in the right upper outer quadrant of the gluteal muscle. No issues with injections. He will follow up in 2 months for next set of injections.  Due for HPV vaccine and will administer at next appointment.  Plan: - Cabenuva injections administered - Next injections scheduled for 02/22/24 with me and 04/25/24 with Dr. Elinor Parkinson  - Call with any issues or questions  Kassie Keng L. Ceairra Mccarver, PharmD, BCIDP, AAHIVP, CPP Clinical Pharmacist Practitioner Infectious Diseases Clinical Pharmacist [facility]

## 2023-12-16 NOTE — Telephone Encounter (Signed)
 Great, thank you so much!

## 2023-12-21 ENCOUNTER — Other Ambulatory Visit: Payer: Self-pay

## 2023-12-21 ENCOUNTER — Ambulatory Visit: Payer: BC Managed Care – PPO | Admitting: Pharmacist

## 2023-12-21 DIAGNOSIS — I119 Hypertensive heart disease without heart failure: Secondary | ICD-10-CM | POA: Diagnosis not present

## 2023-12-21 DIAGNOSIS — N184 Chronic kidney disease, stage 4 (severe): Secondary | ICD-10-CM | POA: Diagnosis not present

## 2023-12-21 DIAGNOSIS — B20 Human immunodeficiency virus [HIV] disease: Secondary | ICD-10-CM | POA: Diagnosis not present

## 2023-12-21 DIAGNOSIS — I43 Cardiomyopathy in diseases classified elsewhere: Secondary | ICD-10-CM | POA: Diagnosis not present

## 2023-12-21 DIAGNOSIS — I129 Hypertensive chronic kidney disease with stage 1 through stage 4 chronic kidney disease, or unspecified chronic kidney disease: Secondary | ICD-10-CM | POA: Diagnosis not present

## 2023-12-21 MED ORDER — CABOTEGRAVIR & RILPIVIRINE ER 600 & 900 MG/3ML IM SUER
1.0000 | Freq: Once | INTRAMUSCULAR | Status: AC
  Administered 2023-12-21: 1 via INTRAMUSCULAR

## 2023-12-23 ENCOUNTER — Ambulatory Visit: Payer: BC Managed Care – PPO | Admitting: Pharmacist

## 2023-12-23 ENCOUNTER — Ambulatory Visit: Payer: Self-pay | Admitting: Pharmacist

## 2023-12-23 NOTE — Telephone Encounter (Signed)
 I spoke with the patient, he stated he will call back. I gave him my direct number.

## 2023-12-28 ENCOUNTER — Encounter: Payer: Self-pay | Admitting: Urgent Care

## 2023-12-28 ENCOUNTER — Ambulatory Visit (INDEPENDENT_AMBULATORY_CARE_PROVIDER_SITE_OTHER): Admitting: Urgent Care

## 2023-12-28 VITALS — BP 150/104 | HR 81 | Wt 235.0 lb

## 2023-12-28 DIAGNOSIS — I10 Essential (primary) hypertension: Secondary | ICD-10-CM

## 2023-12-28 DIAGNOSIS — B20 Human immunodeficiency virus [HIV] disease: Secondary | ICD-10-CM | POA: Diagnosis not present

## 2023-12-28 DIAGNOSIS — N184 Chronic kidney disease, stage 4 (severe): Secondary | ICD-10-CM

## 2023-12-28 DIAGNOSIS — I5022 Chronic systolic (congestive) heart failure: Secondary | ICD-10-CM

## 2023-12-28 NOTE — Patient Instructions (Addendum)
 Will complete FMLA paperwork.  Please keep all scheduled appointments with nephrology, cardiology and infectious disease as scheduled.   Please monitor blood pressure at home.   Return in 4 months for routine follow up.

## 2023-12-28 NOTE — Progress Notes (Signed)
 Established Patient Office Visit  Subjective:  Patient ID: [name], male    DOB: 1986/05/30  Age: 38 y.o. MRN: [id number]  Chief Complaint  Patient presents with   Follow-up    Follow up and pt needs FMLA paperwork completed.    HPI  Pleasant 38yo male presents for follow up and to have FMLA paperwork completed. He was dx with acute HF in Jan of this year. Also dx with severe HTN at that time. He is managed on multiple medications for blood pressure, but states BP still is not controlled. He feels back to his baseline health without any CP or SOB, but is concerned about his labs. He did see nephrology on Thursday of last week who stopped his doxazosin and started losartan 25mg  instead. Additionally carvedilol was changed from 12.5mg  to 25mg  BID. He was told that is kidney disease is from his HTN, however he did not start having mild BP issues until 2023, and more severe BP issues until Jan of this year. Additionally, his creatinine 1 year ago was 1.83, and it has since spiked up to 3.58. Pt concerned as he does not want to end up on dialysis. He does note that several years ago he was on an HIV medication (Biktarvy) that was told could affect the kidneys but he is no longer taking it. Appears to have been Dcd in July 2023  Pt works for [employer] and needs FMLA paperwork completed to allow for intermittent leave due to his scheduling needs around his specialist appointments.   Patient Active Problem List   Diagnosis Date Noted   Chronic systolic congestive heart failure (HCC) 12/14/2023   Essential hypertension 12/14/2023   High serum phosphorus for age 08/15/2024   Stage 4 chronic kidney disease (HCC) 12/14/2023   Nasal congestion with rhinorrhea 11/02/2023   Snoring 11/02/2023   Need for meningococcal vaccination 10/23/2023   Medication monitoring encounter 10/21/2023   Acute pulmonary edema (HCC) 10/19/2023   Anasarca 10/16/2023   Hypertensive emergency 10/16/2023   Acute  heart failure (HCC) 10/16/2023   Acute renal failure superimposed on stage 3b chronic kidney disease (HCC) 10/16/2023   Cough 10/16/2023   SIRS (systemic inflammatory response syndrome) (HCC) 10/16/2023   Screening examination for venereal disease 04/08/2023   Rectal pain 04/08/2023   Encounter for long-term (current) use of medications 04/08/2023   Latent syphilis 11/11/2022   Nasal septal deviation 04/05/2020   Renal insufficiency 06/15/2018   Healthcare maintenance 11/04/2017   Depression 10/27/2017   Former smoker 12/08/2013   Positive hepatitis C antibody test 11/18/2013   Thrombocytopenia, unspecified (HCC) 11/17/2013   Human immunodeficiency virus (HIV) disease (HCC) 11/17/2013   Epistaxis 11/17/2013   Normocytic anemia 11/17/2013   Chronic otitis media 11/17/2013   Transaminitis 11/17/2013   Nasopharyngeal mass 11/16/2013   Past Medical History:  Diagnosis Date   HIV (human immunodeficiency virus infection) (HCC)    ITP (idiopathic thrombocytopenic purpura) 11/24/2013   Past Surgical History:  Procedure Laterality Date   ADENOIDECTOMY N/A 11/16/2013   Procedure: ADENOIDECTOMY;  Surgeon: Flo Shanks, MD;  Location: [facility] OR;  Service: ENT;  Laterality: N/A;   MYRINGOTOMY WITH TUBE PLACEMENT Bilateral 11/16/2013   Procedure: MYRINGOTOMY WITH TUBE PLACEMENT;  Surgeon: Flo Shanks, MD;  [facility];  Service: ENT;  Laterality: Bilateral;   NASAL ENDOSCOPY WITH EPISTAXIS CONTROL N/A 11/16/2013   Procedure: NASAL ENDOSCOPY with nasopharyngeal biopsy with frozen section and adnoidectomy;  Surgeon: Flo Shanks, MD;  Location: [facility] OR;  Service: ENT;  Laterality: N/A;   RIGHT HEART CATH N/A 10/19/2023   Procedure: RIGHT HEART CATH;  Surgeon: Laurey Morale, MD;  Location: [facility] INVASIVE CV LAB;  Service: Cardiovascular;  Laterality: N/A;   Social History   Tobacco Use   Smoking status: Former    Current packs/day: 0.00    Average packs/day: 0.1 packs/day for 12.5 years (1.2  ttl pk-yrs)    Types: Cigarettes    Start date: 12/30/2001    Quit date: 06/26/2014    Years since quitting: 9.5   Smokeless tobacco: Never   Tobacco comments:    pt states he wants to quit by his birthday in July  Vaping Use   Vaping status: Never Used  Substance Use Topics   Alcohol use: No   Drug use: No      ROS: as noted in HPI  Objective:     BP (!) 150/104   Pulse 81   Wt 235 lb (106.6 kg)   SpO2 99%   BMI 31.00 kg/m  BP Readings from Last 3 Encounters:  12/28/23 (!) 150/104  12/14/23 (!) 153/110  12/07/23 (!) 156/120   Wt Readings from Last 3 Encounters:  12/28/23 235 lb (106.6 kg)  12/14/23 232 lb 6.4 oz (105.4 kg)  11/02/23 230 lb 6.4 oz (104.5 kg)      Physical Exam Vitals and nursing note reviewed.  Constitutional:      Appearance: Normal appearance. He is normal weight. He is not ill-appearing, toxic-appearing or diaphoretic.  HENT:     Head: Normocephalic and atraumatic.  Cardiovascular:     Rate and Rhythm: Normal rate.  Pulmonary:     Effort: Pulmonary effort is normal. No respiratory distress.  Skin:    General: Skin is warm and dry.     Coloration: Skin is not jaundiced.     Findings: No erythema.  Neurological:     General: No focal deficit present.     Mental Status: He is alert and oriented to person, place, and time.  Psychiatric:        Mood and Affect: Mood normal.        Behavior: Behavior normal.      No results found for any visits on 12/28/23.  Last CBC Lab Results  Component Value Date   WBC 6.7 12/14/2023   HGB 13.2 12/14/2023   HCT 40.3 12/14/2023   MCV 87.2 12/14/2023   MCH 29.4 10/15/2023   RDW 13.3 12/14/2023   PLT 156.0 12/14/2023   Last metabolic panel Lab Results  Component Value Date   GLUCOSE 85 12/14/2023   NA 139 12/14/2023   K 4.6 12/14/2023   CL 106 12/14/2023   CO2 26 12/14/2023   BUN 30 (H) 12/14/2023   CREATININE 3.58 (H) 12/14/2023   GFR 20.50 (L) 12/14/2023   CALCIUM 8.7 12/14/2023    PHOS 3.1 12/14/2023   PROT 6.6 12/14/2023   ALBUMIN 3.5 12/14/2023   BILITOT 0.9 12/14/2023   ALKPHOS 82 12/14/2023   AST 25 12/14/2023   ALT 21 12/14/2023   ANIONGAP 9 10/20/2023   Last lipids Lab Results  Component Value Date   CHOL 202 (H) 04/08/2023   HDL 47 04/08/2023   LDLCALC 131 (H) 04/08/2023   TRIG 126 04/08/2023   CHOLHDL 4.3 04/08/2023   Last hemoglobin A1c No results found for: "HGBA1C"    The ASCVD Risk score (Arnett DK, et al., 2019) failed to calculate for the following reasons:   The 2019  ASCVD risk score is only valid for ages 71 to 7  Assessment & Plan:  Stage 4 chronic kidney disease (HCC) -     Aldosterone + renin activity w/ ratio  Essential hypertension -     Aldosterone + renin activity w/ ratio  Chronic systolic congestive heart failure (HCC)  Human immunodeficiency virus (HIV) disease (HCC)  Pt is following with cardiology, nephrology, and ID. He has a follow up with nephrology in less than a month. Creatinine continues to climb, GFR continues to fall. He remains on [place] which can raise creatinine. He has been on HIV medication however since 2015 when he was first dx with the condition.  He overall feels well without complaints.   Return in about 4 months (around 04/28/2024).   Maretta Bees, PA

## 2024-01-04 LAB — ALDOSTERONE + RENIN ACTIVITY W/ RATIO
ALDO / PRA Ratio: 4.4 ratio (ref 0.9–28.9)
Aldosterone: 5 ng/dL
Renin Activity: 1.13 ng/mL/h (ref 0.25–5.82)

## 2024-01-26 DIAGNOSIS — J342 Deviated nasal septum: Secondary | ICD-10-CM | POA: Diagnosis not present

## 2024-01-26 DIAGNOSIS — R0981 Nasal congestion: Secondary | ICD-10-CM | POA: Diagnosis not present

## 2024-01-26 DIAGNOSIS — J343 Hypertrophy of nasal turbinates: Secondary | ICD-10-CM | POA: Diagnosis not present

## 2024-02-01 DIAGNOSIS — I129 Hypertensive chronic kidney disease with stage 1 through stage 4 chronic kidney disease, or unspecified chronic kidney disease: Secondary | ICD-10-CM | POA: Diagnosis not present

## 2024-02-01 DIAGNOSIS — N184 Chronic kidney disease, stage 4 (severe): Secondary | ICD-10-CM | POA: Diagnosis not present

## 2024-02-01 DIAGNOSIS — N179 Acute kidney failure, unspecified: Secondary | ICD-10-CM | POA: Diagnosis not present

## 2024-02-01 DIAGNOSIS — R809 Proteinuria, unspecified: Secondary | ICD-10-CM | POA: Diagnosis not present

## 2024-02-03 ENCOUNTER — Encounter: Payer: Self-pay | Admitting: Urgent Care

## 2024-02-03 NOTE — Telephone Encounter (Signed)
 No further action needed.

## 2024-02-04 ENCOUNTER — Other Ambulatory Visit (HOSPITAL_COMMUNITY): Payer: Self-pay

## 2024-02-04 ENCOUNTER — Other Ambulatory Visit: Payer: Self-pay

## 2024-02-04 NOTE — Progress Notes (Signed)
 Specialty Pharmacy Refill Coordination Note  [name] is a 38 y.o. male assessed today regarding refills of clinic administered specialty medication(s) Cabotegravir  & Rilpivirine  (CABENUVA )   Clinic requested Courier to Provider Office   Delivery date: 02/15/24   Verified address: [address] [place] [address]   Medication will be filled on 02/12/24.

## 2024-02-10 ENCOUNTER — Ambulatory Visit: Admitting: Urgent Care

## 2024-02-10 ENCOUNTER — Encounter: Payer: Self-pay | Admitting: Urgent Care

## 2024-02-10 VITALS — BP 165/117 | HR 87 | Ht 73.0 in | Wt 240.0 lb

## 2024-02-10 DIAGNOSIS — Z01818 Encounter for other preprocedural examination: Secondary | ICD-10-CM

## 2024-02-10 NOTE — Progress Notes (Signed)
 Established Patient Office Visit  Subjective:  Patient ID: [name], male    DOB: 1986-08-27  Age: 38 y.o. MRN: [id number]  Chief Complaint  Patient presents with   Pre-op Exam    Pt will be having surgery on June 3rd and needs clearance. He will need a letter stating that his kidneys are ok and also a letter stating the his std status is not an issue    HPI  Discussed the use of AI scribe software for clinical note transcription with the patient, who gave verbal consent to proceed.  History of Present Illness   [name] is a 38 year old male with chronic kidney disease, HIV and CHF/ HTN who presents for preoperative clearance for liposuction.  He is seeking preoperative clearance for a liposuction procedure scheduled for June 3rd. He requires confirmation that his kidney function is stable enough for surgery. He has a history of elevated creatinine levels and chronic kidney issues. Recent blood work showed a creatinine level of 3.58 and a GFR of 20. He last saw his nephrologist at [facility] two to three weeks ago, where labs were drawn, and he is scheduled to return in one to two weeks.  He also requires clearance regarding his HIV status to ensure it does not interfere with the surgical procedure. He is currently under the care of a new infectious disease specialist at [facility]. He has had this condition for over a decade and is currently on Cabenuva .  He was recently hospitalized due to acute CHF and accelerated HTN. He was discharged home on norvasc , coreg , bidil , losartan, and toresmide. States he is taking all as prescribed and is asymptomatic. Feels well, stable and at baseline health. He is currently monitoring his blood pressure at home, which he reports as generally well-controlled, though it was elevated during today's visit, he states due to nervousness. He uses an automatic blood pressure cuff at home and indicates systolic readings are almost always in the  130s.  He has no history of previous surgeries or general anesthesia.   Patient Active Problem List   Diagnosis Date Noted   Chronic systolic congestive heart failure (HCC) 12/14/2023   Essential hypertension 12/14/2023   High serum phosphorus for age 12/14/2023   Stage 4 chronic kidney disease (HCC) 12/14/2023   Nasal congestion with rhinorrhea 11/02/2023   Snoring 11/02/2023   Need for meningococcal vaccination 10/23/2023   Medication monitoring encounter 10/21/2023   Acute pulmonary edema (HCC) 10/19/2023   Anasarca 10/16/2023   Hypertensive emergency 10/16/2023   Acute heart failure (HCC) 10/16/2023   Acute renal failure superimposed on stage 3b chronic kidney disease (HCC) 10/16/2023   Cough 10/16/2023   SIRS (systemic inflammatory response syndrome) (HCC) 10/16/2023   Screening examination for venereal disease 04/08/2023   Rectal pain 04/08/2023   Encounter for long-term (current) use of medications 04/08/2023   Latent syphilis 11/11/2022   Nasal septal deviation 04/05/2020   Renal insufficiency 06/15/2018   Healthcare maintenance 11/04/2017   Depression 10/27/2017   Former smoker 12/08/2013   Positive hepatitis C antibody test 11/18/2013   Thrombocytopenia, unspecified (HCC) 11/17/2013   Human immunodeficiency virus (HIV) disease (HCC) 11/17/2013   Epistaxis 11/17/2013   Normocytic anemia 11/17/2013   Chronic otitis media 11/17/2013   Transaminitis 11/17/2013   Nasopharyngeal mass 11/16/2013   Past Medical History:  Diagnosis Date   HIV (human immunodeficiency virus infection) (HCC)    ITP (idiopathic thrombocytopenic purpura) 11/24/2013   Past Surgical  History:  Procedure Laterality Date   ADENOIDECTOMY N/A 11/16/2013   Procedure: ADENOIDECTOMY;  Surgeon: Lenton Rail, MD;  Location: [facility] OR;  Service: ENT;  Laterality: N/A;   MYRINGOTOMY WITH TUBE PLACEMENT Bilateral 11/16/2013   Procedure: MYRINGOTOMY WITH TUBE PLACEMENT;  Surgeon: Lenton Rail, MD;  Location:  [facility] OR;  Service: ENT;  Laterality: Bilateral;   NASAL ENDOSCOPY WITH EPISTAXIS CONTROL N/A 11/16/2013   Procedure: NASAL ENDOSCOPY with nasopharyngeal biopsy with frozen section and adnoidectomy;  Surgeon: Lenton Rail, MD;  Location: [facility] OR;  Service: ENT;  Laterality: N/A;   RIGHT HEART CATH N/A 10/19/2023   Procedure: RIGHT HEART CATH;  Surgeon: Darlis Eisenmenger, MD;  Location: [facility] INVASIVE CV LAB;  Service: Cardiovascular;  Laterality: N/A;   Social History   Tobacco Use   Smoking status: Former    Current packs/day: 0.00    Average packs/day: 0.1 packs/day for 12.5 years (1.2 ttl pk-yrs)    Types: Cigarettes    Start date: 12/30/2001    Quit date: 06/26/2014    Years since quitting: 9.6   Smokeless tobacco: Never   Tobacco comments:    pt states he wants to quit by his birthday in July  Vaping Use   Vaping status: Never Used  Substance Use Topics   Alcohol use: No   Drug use: No      ROS: as noted in HPI  Objective:     BP (!) 165/117   Pulse 87   Ht 6\' 1"  (1.854 m)   Wt 240 lb (108.9 kg)   SpO2 99%   BMI 31.66 kg/m  BP Readings from Last 3 Encounters:  02/10/24 (!) 165/117  12/28/23 (!) 150/104  12/14/23 (!) 153/110   Wt Readings from Last 3 Encounters:  02/10/24 240 lb (108.9 kg)  12/28/23 235 lb (106.6 kg)  12/14/23 232 lb 6.4 oz (105.4 kg)      Physical Exam Vitals and nursing note reviewed.  Constitutional:      Appearance: Normal appearance. He is normal weight. He is not ill-appearing, toxic-appearing or diaphoretic.  HENT:     Head: Normocephalic and atraumatic.     Right Ear: External ear normal.     Left Ear: External ear normal.     Nose: Nose normal. No congestion or rhinorrhea.     Mouth/Throat:     Mouth: Mucous membranes are moist.     Pharynx: Oropharynx is clear. No oropharyngeal exudate or posterior oropharyngeal erythema.  Eyes:     General: No scleral icterus.       Right eye: No discharge.        Left eye: No discharge.      Extraocular Movements: Extraocular movements intact.     Pupils: Pupils are equal, round, and reactive to light.  Cardiovascular:     Rate and Rhythm: Normal rate and regular rhythm.     Heart sounds: Normal heart sounds.     No friction rub.  Pulmonary:     Effort: Pulmonary effort is normal. No respiratory distress.     Breath sounds: Normal breath sounds. No stridor. No wheezing or rhonchi.  Abdominal:     General: Abdomen is flat. Bowel sounds are normal. There is no distension.     Palpations: Abdomen is soft.     Tenderness: There is no abdominal tenderness. There is no guarding or rebound.     Hernia: No hernia is present.  Musculoskeletal:     Right lower leg: No edema.  Left lower leg: No edema.  Skin:    General: Skin is warm and dry.     Coloration: Skin is not jaundiced.     Findings: No erythema.  Neurological:     General: No focal deficit present.     Mental Status: He is alert and oriented to person, place, and time.     Sensory: No sensory deficit.     Motor: No weakness.     Gait: Gait normal.  Psychiatric:        Mood and Affect: Mood normal.        Behavior: Behavior normal.     No results found for any visits on 02/10/24.  EKG L axis rotation, non-specific T wave abnormality, LVH. Sinus rhythm  Last CBC Lab Results  Component Value Date   WBC 6.7 12/14/2023   HGB 13.2 12/14/2023   HCT 40.3 12/14/2023   MCV 87.2 12/14/2023   MCH 29.4 10/15/2023   RDW 13.3 12/14/2023   PLT 156.0 12/14/2023   Last metabolic panel Lab Results  Component Value Date   GLUCOSE 85 12/14/2023   NA 139 12/14/2023   K 4.6 12/14/2023   CL 106 12/14/2023   CO2 26 12/14/2023   BUN 30 (H) 12/14/2023   CREATININE 3.58 (H) 12/14/2023   GFR 20.50 (L) 12/14/2023   CALCIUM 8.7 12/14/2023   PHOS 3.1 12/14/2023   PROT 6.6 12/14/2023   ALBUMIN  3.5 12/14/2023   BILITOT 0.9 12/14/2023   ALKPHOS 82 12/14/2023   AST 25 12/14/2023   ALT 21 12/14/2023   ANIONGAP 9  10/20/2023   Last lipids Lab Results  Component Value Date   CHOL 202 (H) 04/08/2023   HDL 47 04/08/2023   LDLCALC 131 (H) 04/08/2023   TRIG 126 04/08/2023   CHOLHDL 4.3 04/08/2023   Last hemoglobin A1c No results found for: "HGBA1C" Last thyroid functions Lab Results  Component Value Date   TSH 1.59 11/02/2023   Last vitamin D No results found for: "25OHVITD2", "25OHVITD3", "VD25OH" Last vitamin B12 and Folate Lab Results  Component Value Date   VITAMINB12 385 11/18/2013   FOLATE >20.0 11/18/2013      The ASCVD Risk score (Arnett DK, et al., 2019) failed to calculate for the following reasons:   The 2019 ASCVD risk score is only valid for ages 38 to 72  Assessment & Plan:  Preop examination -     EKG 12-Lead  Assessment and Plan    Hypertension, unspecified Hypertension not well-controlled. Elevated office readings suggestive of possible white coat hypertension. Home readings reportedly better. Blood pressure control essential for surgical clearance. - Review home blood pressure cuff memory and document readings. I have asked patient to bring in his home BP cuff so we can review his readings on his BP cuff memory function. - Monitor blood pressure regularly at home. - Consider alternative weight loss options if hypertension remains uncontrolled. (We did discuss GLP-1 therapy which he was not in favor of)  CHF Pt remains on Rx therapy from hospital, he is feeling well and asx. - Will need cardiac clearance as well - EKG performed in office today, LVH noted likely secondary to uncontrolled BP  Chronic kidney disease, stage 4 Chronic kidney disease stage 4 with creatinine clearance at 43. Recent nephrology labs needed to confirm stability before surgical clearance. - Contact [facility] for recent renal labs and nephrologist's recommendations. - Hold surgical clearance until nephrologist's input is received.  HIV status HIV status requires documentation for  surgical clearance. No issues with current management or complications. - Will need surgical clearance from infectious disease prior to signing off on paperwork.         No follow-ups on file.   Mandy Second, PA

## 2024-02-10 NOTE — Patient Instructions (Signed)
 EKG was performed today. We will need to also get documentation from your ID specialist, cardiologist and nephrologist. I will try to contact them for you.  In the meantime, continue to monitor your blood pressure at home. BRING YOUR BLOOD PRESSURE MONITOR to our office so that we can review your home BP readings.

## 2024-02-12 ENCOUNTER — Other Ambulatory Visit: Payer: Self-pay

## 2024-02-12 ENCOUNTER — Telehealth (HOSPITAL_COMMUNITY): Payer: Self-pay | Admitting: Cardiology

## 2024-02-12 ENCOUNTER — Telehealth: Payer: Self-pay

## 2024-02-12 ENCOUNTER — Telehealth: Payer: Self-pay | Admitting: Urgent Care

## 2024-02-12 NOTE — Telephone Encounter (Signed)
 Patient PCP Hollis Lurie, PA called stating that patient is wanting to have a liposuction procedure done. PCP informed patient he would need to get clearance/approval from his specialist providers before she would sign surgical clearance.   Dr.Manandhar do you want patient to be seen? Or can you write a letter to PCP?

## 2024-02-12 NOTE — Telephone Encounter (Signed)
 Front office left message for patient to schedule a follow up appt in the [facility] for surgical clearence. Front office left patient a message with instructions to call back to get scheduled for surgical clearence per Sherolyn Dixon, RN.

## 2024-02-12 NOTE — Telephone Encounter (Signed)
 Pt was seen on 02/10/24 for surgical clearance for elective liposuction procedure. Pt was notified at that visit that he would need additional documentation from all three of his specialists getting clearance as well.  I personally spoke with Dr. Jearldine Mina this morning who stated that their office was already contacted by patient and additional paperwork will need to be filled out. He also stated that his CKD puts him at a higher risk category to proceed but is not a contraindication.  Called and spoke with the office of infectious disease, Dr. Arne Langdon. They will require an office visit to discuss surgical clearance and they will reach out to the patient directly to schedule.  Lastly, message sent directly to Dr. Mitzie Anda in cardiology. Unfortunately, pt was never seen in follow up after his hospitalization. Pt will also need an in office visit with cardiology in order to have a pre-surgical clearance. This will be performed at the [facility] clinic at [facility].  All three specialists aware and notified of the need for additional workup/ documentation for patients desired liposuction surgery which is scheduled for June 3.

## 2024-02-15 ENCOUNTER — Other Ambulatory Visit: Payer: Self-pay

## 2024-02-15 ENCOUNTER — Encounter: Payer: Self-pay | Admitting: Infectious Diseases

## 2024-02-15 ENCOUNTER — Encounter (HOSPITAL_COMMUNITY)

## 2024-02-15 ENCOUNTER — Other Ambulatory Visit (HOSPITAL_COMMUNITY): Payer: Self-pay

## 2024-02-15 ENCOUNTER — Ambulatory Visit (INDEPENDENT_AMBULATORY_CARE_PROVIDER_SITE_OTHER): Admitting: Infectious Diseases

## 2024-02-15 VITALS — BP 164/117 | HR 73 | Temp 98.6°F | Ht 73.0 in | Wt 240.0 lb

## 2024-02-15 DIAGNOSIS — Z113 Encounter for screening for infections with a predominantly sexual mode of transmission: Secondary | ICD-10-CM | POA: Diagnosis not present

## 2024-02-15 DIAGNOSIS — B20 Human immunodeficiency virus [HIV] disease: Secondary | ICD-10-CM

## 2024-02-15 DIAGNOSIS — Z79899 Other long term (current) drug therapy: Secondary | ICD-10-CM

## 2024-02-15 NOTE — Progress Notes (Signed)
 [address], [place], [place], [address]                                                                  Phn. [phone]; Fax: [phone]                                                                             Date: 02/15/24  Reason for Visit: clearance for liposuction   HPI: [name] is a 38 y.o.old male with a history of HIV on Biktarvy > cabenuva  since 05/27/23, HCV ab reactive ( HCV RNA negative in 2015 and 2016), Depression, syphilis who is here for fu  Interval hx/current visit: He is getting cabenuva  injections without any concerns, however planned to go for liposuction and reports PCP asking for approval from specialist prior to surgical clearance. He was seen by Neurology for OSA and seen by ENT with plans for septoplasty and bilateral inferior turbinate reduction. He has no other concerns today. Discussed to do labs today before clearing from ID standpoint.   ROS: As stated in above HPI; all other systems were reviewed and are otherwise negative unless noted below  No reported fever / chills, night sweats, unintentional weight loss, acute visual change, odynophagia, chest pain/pressure, new or worsened SOB or WOB, nausea, vomiting, diarrhea, dysuria, GU discharge, syncope, seizures, red/hot swollen joints, hallucinations / delusions, rashes, new allergies, unusual / excessive bleeding, swollen lymph nodes  PMH/ PSH/ FamHx / Social Hx , medications and allergies reviewed and updated as appropriate; please see corresponding tab in EHR / prior notes                                        Current Outpatient Medications on File Prior to Visit  Medication Sig Dispense Refill   acetaminophen  (TYLENOL ) 500 MG tablet Take 1,000 mg by mouth every 6 (six) hours as needed for mild  pain (pain score 1-3), fever or headache.     amLODipine  (NORVASC ) 10 MG tablet Take 1 tablet (10 mg total) by mouth daily. 90 tablet 1   Azelastine -Fluticasone  137-50 MCG/ACT SUSP Place 1 spray into the nose every 12 (twelve) hours. 23 g 2   cabotegravir  & rilpivirine  ER (CABENUVA ) 600 & 900 MG/3ML injection Inject 1 kit into the muscle every 2 (two) months. 6 mL 5   carvedilol  (COREG ) 25 MG tablet Take 25 mg by mouth 2 (two) times daily.     isosorbide -hydrALAZINE  (BIDIL ) 20-37.5 MG tablet Take 2 tablets by mouth 3 (three) times daily. 540 tablet 1  losartan (COZAAR) 25 MG tablet Take 25 mg by mouth daily.     torsemide  (DEMADEX ) 20 MG tablet Take 2 tablets (40mg ) daily if needed for weight gain above 3 pounds from your baseline weight in 24 hours or 5 pounds above your dry weight in a week, check weight daily 180 tablet 1   No current facility-administered medications on file prior to visit.    No Known Allergies  Past Medical History:  Diagnosis Date   HIV (human immunodeficiency virus infection) (HCC)    ITP (idiopathic thrombocytopenic purpura) 11/24/2013    Past Surgical History:  Procedure Laterality Date   ADENOIDECTOMY N/A 11/16/2013   Procedure: ADENOIDECTOMY;  Surgeon: Lenton Rail, MD;  Location: [facility] OR;  Service: ENT;  Laterality: N/A;   MYRINGOTOMY WITH TUBE PLACEMENT Bilateral 11/16/2013   Procedure: MYRINGOTOMY WITH TUBE PLACEMENT;  Surgeon: Lenton Rail, MD;  Location: [facility] OR;  Service: ENT;  Laterality: Bilateral;   NASAL ENDOSCOPY WITH EPISTAXIS CONTROL N/A 11/16/2013   Procedure: NASAL ENDOSCOPY with nasopharyngeal biopsy with frozen section and adnoidectomy;  Surgeon: Lenton Rail, MD;  Location: [facility] OR;  Service: ENT;  Laterality: N/A;   RIGHT HEART CATH N/A 10/19/2023   Procedure: RIGHT HEART CATH;  Surgeon: Darlis Eisenmenger, MD;  Location: [facility] INVASIVE CV LAB;  Service: Cardiovascular;  Laterality: N/A;    Social History   Socioeconomic History   Marital status:  Single    Spouse name: Not on file   Number of children: Not on file   Years of education: Not on file   Highest education level: Not on file  Occupational History   Not on file  Tobacco Use   Smoking status: Former    Current packs/day: 0.00    Average packs/day: 0.1 packs/day for 12.5 years (1.2 ttl pk-yrs)    Types: Cigarettes    Start date: 12/30/2001    Quit date: 06/26/2014    Years since quitting: 9.6   Smokeless tobacco: Never   Tobacco comments:    pt states he wants to quit by his birthday in July  Vaping Use   Vaping status: Never Used  Substance and Sexual Activity   Alcohol use: No   Drug use: No   Sexual activity: Not Currently    Partners: Female, Male    Birth control/protection: Condom    Comment: declined condoms  Other Topics Concern   Not on file  Social History Narrative   Caffiene none   Working :  [employer]   Live alone, no pets.    Social Drivers of Corporate investment banker Strain: Not on file  Food Insecurity: No Food Insecurity (10/16/2023)   Hunger Vital Sign    Worried About Running Out of Food in the Last Year: Never true    Ran Out of Food in the Last Year: Never true  Transportation Needs: No Transportation Needs (10/16/2023)   PRAPARE - Administrator, Civil Service (Medical): No    Lack of Transportation (Non-Medical): No  Physical Activity: Not on file  Stress: Not on file  Social Connections: Not on file  Intimate Partner Violence: Not At Risk (10/16/2023)   Humiliation, Afraid, Rape, and Kick questionnaire    Fear of Current or Ex-Partner: No    Emotionally Abused: No    Physically Abused: No    Sexually Abused: No   Family History  Problem Relation Age of Onset   Hypertension Neg Hx    Diabetes Neg Hx  Vitals  BP (!) 164/117   Pulse 73   Temp 98.6 F (37 C) (Oral)   Ht 6\' 1"  (1.854 m)   Wt 240 lb (108.9 kg)   SpO2 98%   BMI 31.66 kg/m   Examination  Gen: no acute distress HEENT: [place]/AT, no scleral  icterus, no pale conjunctivae, hearing normal, oral mucosa moist Neck: Supple Cardio: Regular rate and rhythm Resp: Pulmonary effort normal in room air GI: nondistended GU: Musc: Extremities: No pedal edema Skin: No rashes Neuro: grossly non focal , awake, alert and oriented * 3  Psych: Calm, cooperative    Lab Results HIV 1 RNA Quant  Date Value  10/21/2023 NOT DETECTED copies/mL  08/26/2023 Not Detected Copies/mL  06/24/2023 Not Detected Copies/mL   CD4 T Cell Abs (/uL)  Date Value  12/03/2022 509  06/27/2021 383 (L)  03/26/2020 417   No results found for: "HIV1GENOSEQ" Lab Results  Component Value Date   WBC 6.7 12/14/2023   HGB 13.2 12/14/2023   HCT 40.3 12/14/2023   MCV 87.2 12/14/2023   PLT 156.0 12/14/2023    Lab Results  Component Value Date   CREATININE 3.58 (H) 12/14/2023   BUN 30 (H) 12/14/2023   NA 139 12/14/2023   K 4.6 12/14/2023   CL 106 12/14/2023   CO2 26 12/14/2023   Lab Results  Component Value Date   ALT 21 12/14/2023   AST 25 12/14/2023   ALKPHOS 82 12/14/2023   BILITOT 0.9 12/14/2023    Lab Results  Component Value Date   CHOL 202 (H) 04/08/2023   TRIG 126 04/08/2023   HDL 47 04/08/2023   LDLCALC 131 (H) 04/08/2023   Lab Results  Component Value Date   HAV Reactive (A) 11/17/2013   Lab Results  Component Value Date   HEPBSAG NEGATIVE 11/18/2013   HEPBSAB POSITIVE (A) 11/17/2013   Lab Results  Component Value Date   HCVAB Reactive (A) 11/18/2013   Lab Results  Component Value Date   CHLAMYDIAWP Negative 10/21/2023   CHLAMYDIAWP Negative 10/21/2023   N Negative 10/21/2023   N Negative 10/21/2023   Lab Results  Component Value Date   GCPROBEAPT NEGATIVE 11/17/2013   Lab Results  Component Value Date   QUANTGOLD INDETERM 11/17/2013    Health Maintenance: Immunization History  Administered Date(s) Administered   HPV 9-valent 06/24/2023, 08/26/2023   Influenza, Seasonal, Injecte, Preservative Fre 08/26/2023    Influenza,inj,Quad PF,6+ Mos 11/18/2013, 06/27/2014, 10/27/2017, 09/16/2018, 06/27/2021   Meningococcal Mcv4o 05/27/2023, 10/21/2023   PNEUMOCOCCAL CONJUGATE-20 05/27/2023   Pfizer Covid-19 Vaccine Bivalent Booster 64yrs & up 06/27/2021   Pfizer(Comirnaty)Fall Seasonal Vaccine 12 years and older 08/26/2023   Pneumococcal Polysaccharide-23 11/18/2013   Tdap 11/04/2017, 12/20/2018    Assessment/Plan: # HIV - Labs today  - Next cabenuva  injection in May 19th  - Discussed will send letter for no restrictions for liposuction surgery from ID standpoint after reviewing labs. Medical clearance to be done by his PCP  # CKD/HTNCHF - Following with PCP, last seen 02/17/24  # Depression - Stable  # STD Screening  - Deferred   Patient's labs were reviewed as well as his previous records. Patients questions were addressed and answered. Safe sex counseling done.  I have personally spent 20 minutes involved in face-to-face and non-face-to-face activities for this patient on the day of the visit. Professional time spent includes the following activities: Preparing to see the patient (review of tests), Obtaining and reviewing separately obtained history (notes from PCP, Neurology  and ENT), Performing a medically appropriate examination and/or evaluation , Ordering labs, referring and communicating with other health care professionals, Documenting clinical information in the EMR, Independently interpreting results (not separately reported), Communicating results to the patient, Counseling and educating the patient and Care coordination (not separately reported).   Electronically signed by:  Terre Ferri, MD Infectious Disease Physician [facility] [address] [place], [place] [address] Phone: [phone]  Fax: [phone]

## 2024-02-16 ENCOUNTER — Telehealth (HOSPITAL_COMMUNITY): Payer: Self-pay

## 2024-02-16 DIAGNOSIS — J343 Hypertrophy of nasal turbinates: Secondary | ICD-10-CM | POA: Diagnosis not present

## 2024-02-16 DIAGNOSIS — J342 Deviated nasal septum: Secondary | ICD-10-CM | POA: Diagnosis not present

## 2024-02-16 DIAGNOSIS — J3489 Other specified disorders of nose and nasal sinuses: Secondary | ICD-10-CM | POA: Diagnosis not present

## 2024-02-16 LAB — T-HELPER CELLS (CD4) COUNT (NOT AT ARMC)
CD4 % Helper T Cell: 26 % — ABNORMAL LOW (ref 33–65)
CD4 T Cell Abs: 486 /uL (ref 400–1790)

## 2024-02-16 NOTE — Telephone Encounter (Signed)
 Called to confirm/remind patient of their appointment at the [facility] on 02/17/24.   Appointment:   [x] Confirmed  [] Left mess   [] No answer/No voice mail  [] VM Full/unable to leave message  [] Phone not in service  Patient reminded to bring all medications and/or complete list.  Confirmed patient has transportation. Gave directions, instructed to utilize valet parking.

## 2024-02-17 ENCOUNTER — Encounter (HOSPITAL_COMMUNITY): Payer: Self-pay

## 2024-02-17 ENCOUNTER — Ambulatory Visit (HOSPITAL_COMMUNITY)
Admission: RE | Admit: 2024-02-17 | Discharge: 2024-02-17 | Disposition: A | Discharge status: Home / Self Care | Source: Ambulatory Visit | Attending: Family Medicine | Admitting: Family Medicine

## 2024-02-17 ENCOUNTER — Ambulatory Visit (HOSPITAL_COMMUNITY): Payer: Self-pay | Admitting: Family Medicine

## 2024-02-17 VITALS — BP 160/88 | HR 76 | Wt 241.0 lb

## 2024-02-17 DIAGNOSIS — Z5181 Encounter for therapeutic drug level monitoring: Secondary | ICD-10-CM | POA: Diagnosis not present

## 2024-02-17 DIAGNOSIS — I5022 Chronic systolic (congestive) heart failure: Secondary | ICD-10-CM | POA: Diagnosis not present

## 2024-02-17 DIAGNOSIS — Z87891 Personal history of nicotine dependence: Secondary | ICD-10-CM | POA: Insufficient documentation

## 2024-02-17 DIAGNOSIS — Z21 Asymptomatic human immunodeficiency virus [HIV] infection status: Secondary | ICD-10-CM | POA: Diagnosis not present

## 2024-02-17 DIAGNOSIS — N1832 Chronic kidney disease, stage 3b: Secondary | ICD-10-CM | POA: Diagnosis not present

## 2024-02-17 DIAGNOSIS — I10 Essential (primary) hypertension: Secondary | ICD-10-CM | POA: Diagnosis not present

## 2024-02-17 DIAGNOSIS — Z79899 Other long term (current) drug therapy: Secondary | ICD-10-CM | POA: Insufficient documentation

## 2024-02-17 DIAGNOSIS — I130 Hypertensive heart and chronic kidney disease with heart failure and stage 1 through stage 4 chronic kidney disease, or unspecified chronic kidney disease: Secondary | ICD-10-CM | POA: Insufficient documentation

## 2024-02-17 DIAGNOSIS — R0683 Snoring: Secondary | ICD-10-CM | POA: Insufficient documentation

## 2024-02-17 DIAGNOSIS — Z7901 Long term (current) use of anticoagulants: Secondary | ICD-10-CM | POA: Diagnosis not present

## 2024-02-17 DIAGNOSIS — Z01818 Encounter for other preprocedural examination: Secondary | ICD-10-CM

## 2024-02-17 LAB — BASIC METABOLIC PANEL WITH GFR
Anion gap: 8 (ref 5–15)
BUN: 38 mg/dL — ABNORMAL HIGH (ref 6–20)
CO2: 23 mmol/L (ref 22–32)
Calcium: 9.5 mg/dL (ref 8.9–10.3)
Chloride: 109 mmol/L (ref 98–111)
Creatinine, Ser: 3.89 mg/dL — ABNORMAL HIGH (ref 0.61–1.24)
GFR, Estimated: 19 mL/min — ABNORMAL LOW (ref >60)
Glucose, Bld: 100 mg/dL — ABNORMAL HIGH (ref 70–99)
Potassium: 4.3 mmol/L (ref 3.5–5.1)
Sodium: 140 mmol/L (ref 135–145)

## 2024-02-17 LAB — BRAIN NATRIURETIC PEPTIDE: B Natriuretic Peptide: 193.1 pg/mL — ABNORMAL HIGH (ref 0.0–100.0)

## 2024-02-17 MED ORDER — APIXABAN 5 MG PO TABS: 5.0000 mg | ORAL_TABLET | Freq: Two times a day (BID) | ORAL | 5 refills | Status: AC

## 2024-02-17 NOTE — Progress Notes (Signed)
 ReDS Vest / Clip - 02/17/24 1100       ReDS Vest / Clip   Station Marker D    Ruler Value 32    ReDS Value Range Low volume    ReDS Actual Value 41

## 2024-02-17 NOTE — Patient Instructions (Addendum)
 Medication Changes:  START: ELIQUIS 5MG  TWICE DAILY - COPAY CARD AND 30 DAY FREE TRIAL CARD GIVEN   Lab Work:  Labs done today, your results will be available in MyChart, we will contact you for abnormal readings.  Special Instructions // Education:  Your provider has recommended that you have a home sleep study Engineer, water Test).  We have provided you with the equipment in our office today. Please go ahead and download the app. DO NOT OPEN OR TAMPER WITH THE BOX UNTIL WE ADVISE YOU TO DO SO. Once insurance has approved the test our office will call you with PIN number and approval to proceed with testing. Once you have completed the test you just dispose of the equipment, the information is automatically uploaded to us  via blue-tooth technology. If your test is positive for sleep apnea and you need a home CPAP machine you will be contacted by Dr Charl Concha office [facility]) to set this up.    PLEASE CHECK YOUR BLOOD PRESSURE DAILY AND WRITE THIS NUMBER DOWN- PLEASE LET US  KNOW IF YOUR BLOOD PRESSURE TOP NUMBER IS GREATER THAN 140   Follow-Up in: 3 WEEKS WITH PHARMD AS SCHEDULED   THEN AGAIN IN 2 MONTHS WITH AN ECHO WITH DR. Alease Amend   At the [facility], you and your health needs are our priority. We have a designated team specialized in the treatment of Heart Failure. This Care Team includes your primary Heart Failure Specialized Cardiologist (physician), Advanced Practice Providers (APPs- Physician Assistants and Nurse Practitioners), and Pharmacist who all work together to provide you with the care you need, when you need it.   You may see any of the following providers on your designated Care Team at your next follow up:  Dr. Jules Oar Dr. Peder Bourdon Dr. Alwin Baars Dr. Judyth Nunnery Nieves Bars, NP Ruddy Corral, [place] [facility] [place], [place] Dennise Fitz, NP [place] Lee, NP Luster Salters, PharmD   Please be sure to bring in all your  medications bottles to every appointment.   Need to Contact Us :  If you have any questions or concerns before your next appointment please send us  a message through [place] or call our office at [phone].    TO LEAVE A MESSAGE FOR THE NURSE SELECT OPTION 2, PLEASE LEAVE A MESSAGE INCLUDING: YOUR NAME DATE OF BIRTH CALL BACK NUMBER REASON FOR CALL**this is important as we prioritize the call backs  YOU WILL RECEIVE A CALL BACK THE SAME DAY AS LONG AS YOU CALL BEFORE 4:00 PM

## 2024-02-17 NOTE — Progress Notes (Signed)
 [facility] CONSULT NOTE   Primary Care: Mandy Second, [place] Nephrology: [facility] Cardiologist: Dr. Alease Amend  HPI: [name] is a 38 y.o. with a history of HIV on cabenuva , HTN and CKD Stage IIIb, and newly diagnosed systolic heart failure.  Followed by ID.    Admitted 1/25 with new systolic heart failure and hypertensive urgency, BP 172/131 on arrival. Diuresed and BP treated. Echo EF severely reduced LVEF < 20% global HK and RV mildly reduced. Underwent RHC showing low filling pressures and preserved CO. No LHC with advanced CKD and no evidence of ACS. cMRI showed LVEF 18% with prominent apical trabeculations, RVEF 27%, mild to mod TR. Concern for non compaction CM. GDMT titrated, but limited by CKD. He was discharged home, weight 229 lbs.  Unfortunately did not follow up with AHF after discharge.  Today he returns for post hospital HF follow up, also requesting clearance for abdominal liposuction. Overall feeling well. He is not SOB with activity. He works out with free [employer]. Denies palpitations, abnormal bleeding, CP, dizziness, edema, or PND/Orthopnea. Appetite ok. Weight at home 240 pounds. Taking all medications. Works remotely for [employer] of [place]. No ETOH, drug or tobacco use. BP at home ~ 130/90s. Followed by [facility]  Family Hx: no family history, mother with kidney issues. No SCD  Cardiac Studies - cMRI (1/25): LVEF 18% with prominent apical trabeculations, RVEF 27%, mild to mod TR. Prominent apical trabeculations, concern for non compaction CM.   - RHC (1/25): RA 1, PA 11/4 (8), PCWP 1, CO/CI (Fick) 2.48/5.57, SVR 1020  - Echo (1/25): EF < 20%, mild LVH, G2DD, RV mildly reduced  Past Medical History:  Diagnosis Date   HIV (human immunodeficiency virus infection) (HCC)    ITP (idiopathic thrombocytopenic purpura) 11/24/2013   Current Outpatient Medications  Medication Sig Dispense Refill   acetaminophen  (TYLENOL ) 500 MG tablet Take 1,000 mg by mouth every 6 (six)  hours as needed for mild pain (pain score 1-3), fever or headache.     amLODipine  (NORVASC ) 10 MG tablet Take 1 tablet (10 mg total) by mouth daily. 90 tablet 1   Azelastine -Fluticasone  137-50 MCG/ACT SUSP Place 1 spray into the nose every 12 (twelve) hours. 23 g 2   cabotegravir  & rilpivirine  ER (CABENUVA ) 600 & 900 MG/3ML injection Inject 1 kit into the muscle every 2 (two) months. 6 mL 5   carvedilol  (COREG ) 25 MG tablet Take 25 mg by mouth 2 (two) times daily.     isosorbide -hydrALAZINE  (BIDIL ) 20-37.5 MG tablet Take 2 tablets by mouth 3 (three) times daily. 540 tablet 1   losartan (COZAAR) 25 MG tablet Take 25 mg by mouth daily.     torsemide  (DEMADEX ) 20 MG tablet Take 2 tablets (40mg ) daily if needed for weight gain above 3 pounds from your baseline weight in 24 hours or 5 pounds above your dry weight in a week, check weight daily 180 tablet 1   No current facility-administered medications for this encounter.   No Known Allergies  Social History   Socioeconomic History   Marital status: Single    Spouse name: Not on file   Number of children: Not on file   Years of education: Not on file   Highest education level: Not on file  Occupational History   Not on file  Tobacco Use   Smoking status: Former    Current packs/day: 0.00    Average packs/day: 0.1 packs/day for 12.5 years (1.2 ttl pk-yrs)  Types: Cigarettes    Start date: 12/30/2001    Quit date: 06/26/2014    Years since quitting: 9.6   Smokeless tobacco: Never   Tobacco comments:    pt states he wants to quit by his birthday in July  Vaping Use   Vaping status: Never Used  Substance and Sexual Activity   Alcohol use: No   Drug use: No   Sexual activity: Not Currently    Partners: Female, Male    Birth control/protection: Condom    Comment: declined condoms  Other Topics Concern   Not on file  Social History Narrative   Caffiene none   Working :  [employer]   Live alone, no pets.    Social Drivers of Manufacturing engineer Strain: Not on file  Food Insecurity: No Food Insecurity (10/16/2023)   Hunger Vital Sign    Worried About Running Out of Food in the Last Year: Never true    Ran Out of Food in the Last Year: Never true  Transportation Needs: No Transportation Needs (10/16/2023)   PRAPARE - Administrator, Civil Service (Medical): No    Lack of Transportation (Non-Medical): No  Physical Activity: Not on file  Stress: Not on file  Social Connections: Not on file  Intimate Partner Violence: Not At Risk (10/16/2023)   Humiliation, Afraid, Rape, and Kick questionnaire    Fear of Current or Ex-Partner: No    Emotionally Abused: No    Physically Abused: No    Sexually Abused: No   Family History  Problem Relation Age of Onset   Hypertension Neg Hx    Diabetes Neg Hx    Wt Readings from Last 3 Encounters:  02/17/24 109.3 kg (241 lb)  02/15/24 108.9 kg (240 lb)  02/10/24 108.9 kg (240 lb)   BP (!) 166/118   Pulse 90   Wt 109.3 kg (241 lb)   SpO2 97%   BMI 31.80 kg/m   PHYSICAL EXAM: General:  NAD. No resp difficulty, walked into clinic HEENT: Normal Neck: Supple. No JVD. Cor: Regular rate & rhythm. No rubs, gallops or murmurs. Lungs: Clear Abdomen: Soft, nontender, nondistended.  Extremities: No cyanosis, clubbing, rash, edema Neuro: Alert & oriented x 3, moves all 4 extremities w/o difficulty. Affect pleasant.  ReDs reading: 41 %, abnormal  ECG (personally reviewed): NSR, LVH  ASSESSMENT & PLAN: 1. Chronic Systolic Heart Failure - Echo 4/78: severely reduced LVEF < 20% and mildly reduced RV + prominent LV trabeculations  - Uncertain etiology, suspect HTN. No previous cardiac history. Has history of HIV . - RHC post diuresis showed low filling pressures and preserved CO; RA 1, PAP 11/4, PCWP 1, FICK CI 2.48, SVR 1020); no LHC with CKD and no CP - cMRI 1/25: LVEF 18% with prominent apical trabeculations, RVEF 27%, mild to mod TR. Concern for non compaction  CM - Discussed genetic testing, he would like to think about this. - NYHA l, volume OK today. REDs elevated at 41%, but suspect due to body habitus. - GDMT limited due to CKD.  - Continue losartan 25 mg daily - Continue Bidil  2 tab tid  - Continue Coreg  25 mg bid - Continue torsemide  40 mg PRN - Start Eliquis 5 mg bid with concern for thromboembolism with LV noncompaction. No dose adjustment with CKD, discussed with PharmD - Labs today - Repeat echo in 2 months; will need to discuss EP referral for ICD eval.   2. HTN  -  BP remains uncontrolled - Continue meds as above - Continue amlodipine  - Consider addition of Cardura  next if BP not at goal - Check BP daily and log, notify clinic if systolic BP > 140 - Labs today. - Needs sleep study   3. CKD Stage IIIb - Baseline SCr 2.5-2.9 - Renal US : no hydronephrosis; chronic medical renal disease - Avoid hypotension - On ARB - Stressed importance of strict BP control - Follow up with [facility] - Labs today  4. Snoring - Concern for sleep apnea - Arrange home sleep study  5. Pre-Operative Risk Stratification - He is at least a moderate CV risk for this elective, cosmetic procedure. - BP is not controlled. - RCRI score= 3 pts (15% risk of major cardiac event) - I discussed with patient we do not give "clearance" but rather inform of CV risks. It is up to him and his surgeon as to whether surgery will proceed. - I personally called [facility] [employer] in [place], and discussed findings with Sasha. I requested pre-op forms and will fax back to their office.   Follow up in 3 weeks with PharmD for BP and med check, and 2-3 months with Dr. Alease Amend + echo   [facility], FNP-BC 02/17/24  I spent 45 minutes in the care of [name] today including Review of ENT notes in CareEverywhere from [facility], face to face evaluation and discussion of noncompaction CM and treatment, as well as a lengthy discussion on CV risk  stratification and surgical risks. I personally discussed today's visit & findings with [employer].

## 2024-02-17 NOTE — Progress Notes (Signed)
 ITAMAR home sleep study given to patient, all instructions explained, waiver signed, and CLOUDPAT registration complete.

## 2024-02-17 NOTE — Progress Notes (Signed)
Height:     Weight: BMI:  Today's Date:  STOP BANG RISK ASSESSMENT S (snore) Have you been told that you snore?     YES   T (tired) Are you often tired, fatigued, or sleepy during the day?   YES  O (obstruction) Do you stop breathing, choke, or gasp during sleep? NO   P (pressure) Do you have or are you being treated for high blood pressure? YES   B (BMI) Is your body index greater than 35 kg/m? NO   A (age) Are you 40 years old or older? NO   N (neck) Do you have a neck circumference greater than 16 inches?   NO   G (gender) Are you a male? YES   TOTAL STOP/BANG "YES" ANSWERS 4                                                                       For Office Use Only              Procedure Order Form    YES to 3+ Stop Bang questions OR two clinical symptoms - patient qualifies for WatchPAT (CPT 95800)      Clinical Notes: Will consult Sleep Specialist and refer for management of therapy due to patient increased risk of Sleep Apnea. Ordering a sleep study due to the following two clinical symptoms: Excessive daytime sleepiness G47.10 / Gastroesophageal reflux K21.9 / Nocturia R35.1 / Morning Headaches G44.221 / Difficulty concentrating R41.840 / Memory problems or poor judgment G31.84 / Personality changes or irritability R45.4 / Loud snoring R06.83 / Depression F32.9 / Unrefreshed by sleep G47.8 / Impotence N52.9 / History of high blood pressure R03.0 / Insomnia G47.00

## 2024-02-18 ENCOUNTER — Encounter: Payer: Self-pay | Admitting: Infectious Diseases

## 2024-02-18 ENCOUNTER — Telehealth: Payer: Self-pay

## 2024-02-18 LAB — HIV RNA, RTPCR W/R GT (RTI, PI,INT)
HIV 1 RNA Quant: NOT DETECTED {copies}/mL
HIV-1 RNA Quant, Log: NOT DETECTED {Log_copies}/mL

## 2024-02-18 NOTE — Telephone Encounter (Signed)
 Per Dr.Manandhar letter for surgical clearance has been written and patient should be able to access it on my chart.   Patient is aware.

## 2024-02-19 ENCOUNTER — Encounter (HOSPITAL_COMMUNITY)

## 2024-02-19 ENCOUNTER — Telehealth: Payer: Self-pay

## 2024-02-19 NOTE — Progress Notes (Signed)
 HPI: [name] is a 38 y.o. male who presents to the [facility] [place] pharmacy clinic for Cabenuva  administration.  Patient Active Problem List   Diagnosis Date Noted   Chronic systolic congestive heart failure (HCC) 12/14/2023   Essential hypertension 12/14/2023   High serum phosphorus for age 42/07/2024   Stage 4 chronic kidney disease (HCC) 12/14/2023   Nasal congestion with rhinorrhea 11/02/2023   Snoring 11/02/2023   Need for meningococcal vaccination 10/23/2023   Medication monitoring encounter 10/21/2023   Acute pulmonary edema (HCC) 10/19/2023   Anasarca 10/16/2023   Hypertensive emergency 10/16/2023   Acute heart failure (HCC) 10/16/2023   Acute renal failure superimposed on stage 3b chronic kidney disease (HCC) 10/16/2023   Cough 10/16/2023   SIRS (systemic inflammatory response syndrome) (HCC) 10/16/2023   Screening examination for venereal disease 04/08/2023   Rectal pain 04/08/2023   Encounter for long-term (current) use of medications 04/08/2023   Latent syphilis 11/11/2022   Nasal septal deviation 04/05/2020   Renal insufficiency 06/15/2018   Healthcare maintenance 11/04/2017   Depression 10/27/2017   Former smoker 12/08/2013   Positive hepatitis C antibody test 11/18/2013   Thrombocytopenia, unspecified (HCC) 11/17/2013   Human immunodeficiency virus (HIV) disease (HCC) 11/17/2013   Epistaxis 11/17/2013   Normocytic anemia 11/17/2013   Chronic otitis media 11/17/2013   Transaminitis 11/17/2013   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   AMLODIPINE  (NORVASC ) 10 MG TABLET    Take 1 tablet (10 mg total) by mouth daily.   APIXABAN  (ELIQUIS ) 5 MG TABS TABLET    Take 1 tablet (5 mg total) by mouth 2 (two) times daily.    AZELASTINE -FLUTICASONE  137-50 MCG/ACT SUSP    Place 1 spray into the nose every 12 (twelve) hours.   CABOTEGRAVIR  & RILPIVIRINE  ER (CABENUVA ) 600 & 900 MG/3ML INJECTION    Inject 1 kit into the muscle every 2 (two) months.   CARVEDILOL  (COREG ) 25 MG TABLET    Take 25 mg by mouth 2 (two) times daily.   ISOSORBIDE -HYDRALAZINE  (BIDIL ) 20-37.5 MG TABLET    Take 2 tablets by mouth 3 (three) times daily.   LOSARTAN (COZAAR) 25 MG TABLET    Take 25 mg by mouth daily.   TORSEMIDE  (DEMADEX ) 20 MG TABLET    Take 2 tablets (40mg ) daily if needed for weight gain above 3 pounds from your baseline weight in 24 hours or 5 pounds above your dry weight in a week, check weight daily  Modified Medications   No medications on file  Discontinued Medications   No medications on file    Allergies: No Known Allergies  Labs: Lab Results  Component Value Date   HIV1RNAQUANT NOT DETECTED 02/15/2024   HIV1RNAQUANT NOT DETECTED 10/21/2023   HIV1RNAQUANT Not Detected 08/26/2023   CD4TABS 486 02/15/2024   CD4TABS 509 12/03/2022   CD4TABS 383 (L) 06/27/2021    RPR and STI Lab Results  Component Value Date   LABRPR REACTIVE (A) 10/21/2023   LABRPR REACTIVE (A) 04/08/2023   LABRPR REACTIVE (A) 12/03/2022   LABRPR REACTIVE (A) 06/27/2021   LABRPR NON-REACTIVE 03/26/2020   RPRTITER 1:1 (H) 10/21/2023   RPRTITER 1:1 (H) 04/08/2023   RPRTITER 1:2 (H) 12/03/2022   RPRTITER 1:2 (H) 06/27/2021  STI Results GC GC CT CT  Latest Ref Rng & Units  NEGATIVE  NEGATIVE  10/21/2023 10:52 AM Negative    Negative    Negative   Negative    Negative    Negative    04/08/2023 10:25 AM Negative    Negative   Negative    Negative    04/02/2023  8:52 AM Negative   Negative    09/01/2018 12:00 AM Negative   Negative    04/22/2017 12:00 AM Negative   Negative    11/17/2013  2:00 PM  NEGATIVE   NEGATIVE     Hepatitis B Lab Results  Component Value Date   HEPBSAB POSITIVE (A) 11/17/2013   HEPBSAG NEGATIVE 11/18/2013    Hepatitis C Lab Results  Component Value Date   HEPCAB NON-REACTIVE 11/02/2023   Hepatitis A Lab Results  Component Value Date   HAV Reactive (A) 11/17/2013   Lipids: Lab Results  Component Value Date   CHOL 202 (H) 04/08/2023   TRIG 126 04/08/2023   HDL 47 04/08/2023   CHOLHDL 4.3 04/08/2023   VLDL 40 (H) 04/22/2017   LDLCALC 131 (H) 04/08/2023    TARGET DATE: The 21st  Assessment: Boy presents today for his maintenance Cabenuva  injections. Past injections were tolerated well without issues. Last HIV RNA was undetectable on 02/15/24. Doing well with no issues today. Politely declines STI testing today.   Administered cabotegravir  600mg /84mL in left upper outer quadrant of the gluteal muscle. Administered rilpivirine  900 mg/3mL in the right upper outer quadrant of the gluteal muscle. No issues with injections. He will follow up in 2 months for next set of injections.  Discussed eligibility for HPV vaccine dose 3/3 and Shingrix. He agreed to receive HPV vaccine today and deferred Shingrix.  Plan: - Cabenuva  injections administered - Administered HPV vaccine dose 3/3 today - Next injections scheduled for 04/25/24 with Dr. Gillian Lacrosse and 06/20/24 with Cassie - Call with any issues or questions  Georga Killings, PharmD PGY-1 Pharmacy Resident

## 2024-02-19 NOTE — Telephone Encounter (Signed)
 No further action needed.

## 2024-02-19 NOTE — Telephone Encounter (Signed)
 I sent pt a message earlier today on Mychart - I cannot complete his paperwork until I receive clearance from his nephrologist, cardiologist and infectious disease specialist. His cardiologist and ID are requiring office visits before they complete their side.

## 2024-02-19 NOTE — Telephone Encounter (Signed)
 Copied from CRM [id number]. Topic: General - Other >> Feb 18, 2024  2:48 PM Howard Macho wrote: Reason for CRM: patient called stating he is checking on some paperwork that he gave to the doctor and want to know if the paperwork has been filled out  CB [phone]

## 2024-02-20 DIAGNOSIS — Z79899 Other long term (current) drug therapy: Secondary | ICD-10-CM | POA: Insufficient documentation

## 2024-02-20 DIAGNOSIS — B20 Human immunodeficiency virus [HIV] disease: Secondary | ICD-10-CM | POA: Insufficient documentation

## 2024-02-22 ENCOUNTER — Ambulatory Visit: Payer: Self-pay | Admitting: Infectious Diseases

## 2024-02-22 ENCOUNTER — Other Ambulatory Visit: Payer: Self-pay

## 2024-02-22 ENCOUNTER — Ambulatory Visit: Admitting: Pharmacist

## 2024-02-22 DIAGNOSIS — Z113 Encounter for screening for infections with a predominantly sexual mode of transmission: Secondary | ICD-10-CM

## 2024-02-22 DIAGNOSIS — Z23 Encounter for immunization: Secondary | ICD-10-CM | POA: Diagnosis not present

## 2024-02-22 DIAGNOSIS — J343 Hypertrophy of nasal turbinates: Secondary | ICD-10-CM | POA: Diagnosis not present

## 2024-02-22 DIAGNOSIS — J342 Deviated nasal septum: Secondary | ICD-10-CM | POA: Diagnosis not present

## 2024-02-22 DIAGNOSIS — B20 Human immunodeficiency virus [HIV] disease: Secondary | ICD-10-CM

## 2024-02-22 MED ORDER — CABOTEGRAVIR ER 600 MG/3ML IM SUER
600.0000 mg | Freq: Once | INTRAMUSCULAR | Status: AC
Start: 2024-02-22 — End: 2024-02-22
  Administered 2024-02-22: 600 mg via INTRAMUSCULAR

## 2024-02-25 NOTE — Telephone Encounter (Deleted)
 Copied from CRM [id number]. Topic: General - Other >> Feb 18, 2024  2:48 PM Howard Macho wrote: Reason for CRM: patient called stating he is checking on some paperwork that he gave to the doctor and want to know if the paperwork has been filled out  CB [phone] >> Feb 24, 2024  9:54 AM Magdalene School wrote: Patient calling to let office know he will be there within the next hour to pick up his paperwork. Time of call: 8:54AM 02/24/24

## 2024-03-01 NOTE — Progress Notes (Incomplete)
 ***In Progress***    Advanced Heart Failure Clinic Note   Primary Care: Mandy Second, [place] Nephrology: [facility] HF Cardiologist: Dr. Alease Amend  HPI:  [name] is a 38 y.o. with a history of HIV on cabenuva , HTN and CKD Stage IIIb, and newly diagnosed systolic heart failure.   Followed by ID.    Admitted 10/2023 with new systolic heart failure and hypertensive urgency, BP 172/131 on arrival. Diuresed and BP treated. Echo EF severely reduced LVEF < 20% global HK and RV mildly reduced. Underwent RHC showing low filling pressures and preserved CO. No LHC with advanced CKD and no evidence of ACS. cMRI showed LVEF 18% with prominent apical trabeculations, RVEF 27%, mild to mod TR. Concern for non compaction CM. GDMT titrated, but limited by CKD. He was discharged home, weight 229 lbs.   Unfortunately did not follow up with A[facility] after discharge.   Presented to AHF Clinic for post hospital HF follow up, also requesting clearance for abdominal liposuction, on 02/17/24. Overall was feeling well. He was not SOB with activity. He reported that he works out with free weights. Denied palpitations, abnormal bleeding, CP, dizziness, edema, or PND/Orthopnea. Appetite was ok. Weight at home was 240 pounds. Reported taking all medications. Works remotely for [employer] [place]. No ETOH, drug or tobacco use. BP at home ~ 130/90s. Followed by [facility].  Today he returns to HF clinic for pharmacist medication titration. At last visit with APP clinic, patient was started on Eliquis  due to the concern of thromboembolism with LV noncompaction.   Shortness of breath/dyspnea on exertion? {YES NO:22349}  Orthopnea/PND? {YES NO:22349} Edema? {YES NO:22349} Lightheadedness/dizziness? {YES NO:22349} Daily weights at home? {YES NO:22349} Blood pressure/heart rate monitoring at home? {YES [id number] Following low-sodium/fluid-restricted diet? {YES NO:22349}  HF Medications: Carvedilol  25 mg BID Losartan 25 mg daily Bidil  2 tablets  TID Torsemide  40 mg PRN  Has the patient been experiencing any side effects to the medications prescribed?  {YES NO:22349}  Does the patient have any problems obtaining medications due to transportation or finances?   {YES NO:22349}  Understanding of regimen: {excellent/good/fair/poor:19665} Understanding of indications: {excellent/good/fair/poor:19665} Potential of compliance: {excellent/good/fair/poor:19665} Patient understands to avoid NSAIDs. Patient understands to avoid decongestants.    Pertinent Lab Values: Serum creatinine ***, BUN ***, Potassium ***, Sodium ***, BNP ***, Magnesium  ***, Digoxin ***   Vital Signs: Weight: *** (last clinic weight: ***) Blood pressure: ***  Heart rate: ***   Assessment/Plan: 1. Chronic Systolic Heart Failure - Echo 06/1477: severely reduced LVEF < 20% and mildly reduced RV + prominent LV trabeculations  - Uncertain etiology, suspect HTN. No previous cardiac history. Has history of HIV . - RHC post diuresis showed low filling pressures and preserved CO; RA 1, PAP 11/4, PCWP 1, FICK CI 2.48, SVR 1020); no LHC with CKD and no CP - cMRI 10/2023: LVEF 18% with prominent apical trabeculations, RVEF 27%, mild to mod TR. Concern for non compaction CM - Discussed genetic testing last visit with APP, he would like to think about this. - NYHA class I symptoms, euvolemic on exam.  - GDMT limited due to CKD.  - Continue torsemide  40 mg PRN - Continue carvedilol  25 mg BID - Continue losartan 25 mg daily - Continue Bidil  2 tab TID  - Continue Eliquis  5 mg BID with concern for thromboembolism with LV noncompaction.  - Repeat echo in 2 months; will need to discuss EP referral for ICD eval.   2. HTN *** - BP remains uncontrolled -  Continue meds as above - Continue amlodipine  - Consider addition of Cardura  next if BP not at goal - Check BP daily and log, notify clinic if systolic BP > 140 - Labs today. - Needs sleep study   3. CKD Stage IIIb -  Baseline SCr 2.5-2.9 - Renal US : no hydronephrosis; chronic medical renal disease - Avoid hypotension - On ARB - Stressed importance of strict BP control - Follow up with [facility]   4. Snoring - Concern for sleep apnea - Arrange home sleep study   5. Pre-Operative Risk Stratification*** - He is at least a moderate CV risk for this elective, cosmetic procedure. - BP is not controlled. - RCRI score= 3 pts (15% risk of major cardiac event) - I discussed with patient we do not give "clearance" but rather inform of CV risks. It is up to him and his surgeon as to whether surgery will proceed. - I personally called [facility] in [place], and discussed findings with Sasha. I requested pre-op forms and will fax back to their office.  Follow up ***   Luster Salters, PharmD, BCPS, BCCP, CPP Heart Failure Clinic Pharmacist [phone]

## 2024-03-07 ENCOUNTER — Other Ambulatory Visit (HOSPITAL_COMMUNITY)

## 2024-04-01 ENCOUNTER — Telehealth (HOSPITAL_COMMUNITY): Payer: Self-pay | Admitting: Cardiology

## 2024-04-01 NOTE — Telephone Encounter (Signed)
 Called to confirm/remind patient of their appointment at the Advanced Heart Failure Clinic on 04/01/2024.   Appointment:   [] Confirmed  [x] Left mess   [] No answer/No voice mail  [] VM Full/unable to leave message  [] Phone not in service  Patient reminded to bring all medications and/or complete list.  Confirmed patient has transportation. Gave directions, instructed to utilize valet parking.

## 2024-04-03 ENCOUNTER — Telehealth (HOSPITAL_COMMUNITY): Payer: Self-pay | Admitting: Surgery

## 2024-04-03 NOTE — Telephone Encounter (Signed)
 I called to attempt to cancel patient's appt tomorrow. I left patient a message and requested that he call back tomorrow to reschedule.

## 2024-04-04 ENCOUNTER — Telehealth (HOSPITAL_COMMUNITY): Payer: Self-pay | Admitting: Cardiology

## 2024-04-04 ENCOUNTER — Encounter (HOSPITAL_COMMUNITY): Admitting: Cardiology

## 2024-04-11 ENCOUNTER — Ambulatory Visit (HOSPITAL_COMMUNITY): Admission: RE | Admit: 2024-04-11 | Source: Ambulatory Visit

## 2024-04-18 ENCOUNTER — Other Ambulatory Visit (HOSPITAL_COMMUNITY): Payer: Self-pay

## 2024-04-18 ENCOUNTER — Other Ambulatory Visit: Payer: Self-pay

## 2024-04-18 NOTE — Progress Notes (Signed)
 Specialty Pharmacy Refill Coordination Note  [name] is a 38 y.o. male assessed today regarding refills of clinic administered specialty medication(s) Cabotegravir  & Rilpivirine  (CABENUVA )   Clinic requested Courier to Provider Office   Delivery date: 04/20/24   Verified address: [address] [place] [address]   Medication will be filled on 04/19/24.

## 2024-04-19 ENCOUNTER — Other Ambulatory Visit (HOSPITAL_COMMUNITY): Payer: Self-pay

## 2024-04-20 ENCOUNTER — Telehealth: Payer: Self-pay

## 2024-04-20 NOTE — Telephone Encounter (Signed)
 [facility] Patient Advocate Encounter  Patient's medications CABENUVA  have been couriered to [facility] from [facility] and will be administered at the patients appointment on 04/25/24.  Charmaine Sharps, CPhT Specialty Pharmacy Patient [facility] Phone: [phone] Fax:  [phone]

## 2024-04-25 ENCOUNTER — Telehealth: Payer: Self-pay

## 2024-04-25 ENCOUNTER — Ambulatory Visit: Payer: Self-pay | Admitting: Infectious Diseases

## 2024-04-25 NOTE — Telephone Encounter (Signed)
 Called patient to reschedule missed appointment. No answer, left HIPAA compliant vm to contact office.

## 2024-04-27 ENCOUNTER — Telehealth: Payer: Self-pay | Admitting: Infectious Diseases

## 2024-04-27 NOTE — Telephone Encounter (Signed)
 Thank you so much

## 2024-04-27 NOTE — Telephone Encounter (Signed)
 He can be scheduled ASAP after his target window. Will continue as normal even if more than a week after target window, so the last week of July or first week of August would be ok.

## 2024-04-27 NOTE — Telephone Encounter (Signed)
 Chanz called to reschedule a missed Cab appt with the target date ending 7/28. I offered openings for 7/24 and 7/25 but pt only has availability on 7/28. At the time of this message, we do not have any openings on Monday, 7/28. Pt asked for the next available after Monday but I explained that is the last day within his window. Pt then states how he has an appt in September and thought he would be fine until then. I explained he would need to stay within his target window and pt requested to speak with someone from pharmacy. Pt can be reached at [phone].

## 2024-05-02 ENCOUNTER — Encounter: Payer: Self-pay | Admitting: Urgent Care

## 2024-05-02 DIAGNOSIS — I10 Essential (primary) hypertension: Secondary | ICD-10-CM

## 2024-05-02 DIAGNOSIS — I5022 Chronic systolic (congestive) heart failure: Secondary | ICD-10-CM

## 2024-05-03 NOTE — Progress Notes (Deleted)
 HPI: [name] is a 38 y.o. male who presents to the [facility] pharmacy clinic for Cabenuva  administration.  Patient Active Problem List   Diagnosis Date Noted   HIV disease (HCC) 02/20/2024   Medication management 02/20/2024   Chronic systolic congestive heart failure (HCC) 12/14/2023   Essential hypertension 12/14/2023   High serum phosphorus for age 40/07/2024   Stage 4 chronic kidney disease (HCC) 12/14/2023   Nasal congestion with rhinorrhea 11/02/2023   Snoring 11/02/2023   Need for meningococcal vaccination 10/23/2023   Medication monitoring encounter 10/21/2023   Acute pulmonary edema (HCC) 10/19/2023   Anasarca 10/16/2023   Hypertensive emergency 10/16/2023   Acute heart failure (HCC) 10/16/2023   Acute renal failure superimposed on stage 3b chronic kidney disease (HCC) 10/16/2023   Cough 10/16/2023   SIRS (systemic inflammatory response syndrome) (HCC) 10/16/2023   Screening examination for venereal disease 04/08/2023   Rectal pain 04/08/2023   Encounter for long-term (current) use of medications 04/08/2023   Latent syphilis 11/11/2022   Nasal septal deviation 04/05/2020   Renal insufficiency 06/15/2018   Healthcare maintenance 11/04/2017   Depression 10/27/2017   Former smoker 12/08/2013   Positive hepatitis C antibody test 11/18/2013   Thrombocytopenia, unspecified (HCC) 11/17/2013   Epistaxis 11/17/2013   Normocytic anemia 11/17/2013   Chronic otitis media 11/17/2013   Transaminitis 11/17/2013   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   AMLODIPINE  (NORVASC ) 10 MG TABLET    Take 1 tablet (10 mg total) by mouth daily.   APIXABAN  (ELIQUIS ) 5 MG TABS TABLET    Take 1 tablet (5 mg total) by mouth 2 (two) times daily.    AZELASTINE -FLUTICASONE  137-50 MCG/ACT SUSP    Place 1 spray into the nose every 12 (twelve) hours.   CABOTEGRAVIR  & RILPIVIRINE  ER (CABENUVA ) 600 & 900 MG/3ML INJECTION    Inject 1 kit into the muscle every 2 (two) months.   CARVEDILOL  (COREG ) 25 MG TABLET    Take 25 mg by mouth 2 (two) times daily.   ISOSORBIDE -HYDRALAZINE  (BIDIL ) 20-37.5 MG TABLET    Take 2 tablets by mouth 3 (three) times daily.   LOSARTAN  (COZAAR ) 25 MG TABLET    Take 25 mg by mouth daily.   TORSEMIDE  (DEMADEX ) 20 MG TABLET    Take 2 tablets (40mg ) daily if needed for weight gain above 3 pounds from your baseline weight in 24 hours or 5 pounds above your dry weight in a week, check weight daily  Modified Medications   No medications on file  Discontinued Medications   No medications on file    Allergies: No Known Allergies  Labs: Lab Results  Component Value Date   HIV1RNAQUANT NOT DETECTED 02/15/2024   HIV1RNAQUANT NOT DETECTED 10/21/2023   HIV1RNAQUANT Not Detected 08/26/2023   CD4TABS 486 02/15/2024   CD4TABS 509 12/03/2022   CD4TABS 383 (L) 06/27/2021    RPR and STI Lab Results  Component Value Date   LABRPR REACTIVE (A) 10/21/2023   LABRPR REACTIVE (A) 04/08/2023   LABRPR REACTIVE (A) 12/03/2022   LABRPR REACTIVE (A) 06/27/2021   LABRPR NON-REACTIVE 03/26/2020   RPRTITER 1:1 (H) 10/21/2023   RPRTITER 1:1 (H) 04/08/2023   RPRTITER 1:2 (H) 12/03/2022   RPRTITER 1:2 (H) 06/27/2021  STI Results GC GC CT CT  Latest Ref Rng & Units  NEGATIVE  NEGATIVE  10/21/2023 10:52 AM Negative    Negative    Negative   Negative    Negative    Negative    04/08/2023 10:25 AM Negative    Negative   Negative    Negative    04/02/2023  8:52 AM Negative   Negative    09/01/2018 12:00 AM Negative   Negative    04/22/2017 12:00 AM Negative   Negative    11/17/2013  2:00 PM  NEGATIVE   NEGATIVE     Hepatitis B Lab Results  Component Value Date   HEPBSAB POSITIVE (A) 11/17/2013   HEPBSAG NEGATIVE 11/18/2013    Hepatitis C Lab Results  Component Value Date   HEPCAB NON-REACTIVE 11/02/2023   Hepatitis A Lab Results  Component Value Date   HAV Reactive (A) 11/17/2013   Lipids: Lab Results  Component Value Date   CHOL 202 (H) 04/08/2023   TRIG 126 04/08/2023   HDL 47 04/08/2023   CHOLHDL 4.3 04/08/2023   VLDL 40 (H) 04/22/2017   LDLCALC 131 (H) 04/08/2023    TARGET DATE: The 21st  Assessment: [name] presents today for his maintenance Cabenuva  injections. Past injections were tolerated well without issues. Last HIV RNA was not detected in  May. Out of his target window this month due to ***. Still ok to continue on current schedule as he is not more than 3 months past his last injection (02/22/24).  Administered cabotegravir  600mg /91mL in left upper outer quadrant of the gluteal muscle. Administered rilpivirine  900 mg/3mL in the right upper outer quadrant of the gluteal muscle. No issues with injections. He will follow up in 2 months for next set of injections.  Plan: - Cabenuva  injections administered - Next injections scheduled for 06/20/24 with me and *** with Dr. Dea  - Call with any issues or questions  Jadeyn Hargett L. Eldena Dede, PharmD, BCIDP, AAHIVP, CPP Clinical Pharmacist Practitioner - Infectious Diseases Clinical Pharmacist Lead - Specialty Pharmacy [facility]

## 2024-05-04 ENCOUNTER — Ambulatory Visit (INDEPENDENT_AMBULATORY_CARE_PROVIDER_SITE_OTHER): Admitting: Urgent Care

## 2024-05-04 ENCOUNTER — Other Ambulatory Visit: Payer: Self-pay

## 2024-05-04 ENCOUNTER — Ambulatory Visit: Admitting: Pharmacist

## 2024-05-04 ENCOUNTER — Encounter: Payer: Self-pay | Admitting: Urgent Care

## 2024-05-04 ENCOUNTER — Encounter (HOSPITAL_COMMUNITY): Admitting: Cardiology

## 2024-05-04 ENCOUNTER — Ambulatory Visit (HOSPITAL_COMMUNITY)
Admission: RE | Admit: 2024-05-04 | Discharge: 2024-05-04 | Disposition: A | Discharge status: Home / Self Care | Source: Ambulatory Visit | Attending: Family Medicine | Admitting: Family Medicine

## 2024-05-04 DIAGNOSIS — I5022 Chronic systolic (congestive) heart failure: Secondary | ICD-10-CM | POA: Diagnosis not present

## 2024-05-04 DIAGNOSIS — I77810 Thoracic aortic ectasia: Secondary | ICD-10-CM | POA: Insufficient documentation

## 2024-05-04 DIAGNOSIS — N184 Chronic kidney disease, stage 4 (severe): Secondary | ICD-10-CM | POA: Diagnosis not present

## 2024-05-04 DIAGNOSIS — B20 Human immunodeficiency virus [HIV] disease: Secondary | ICD-10-CM | POA: Diagnosis not present

## 2024-05-04 DIAGNOSIS — I110 Hypertensive heart disease with heart failure: Secondary | ICD-10-CM | POA: Diagnosis not present

## 2024-05-04 DIAGNOSIS — I3139 Other pericardial effusion (noninflammatory): Secondary | ICD-10-CM | POA: Diagnosis not present

## 2024-05-04 DIAGNOSIS — R7989 Other specified abnormal findings of blood chemistry: Secondary | ICD-10-CM

## 2024-05-04 DIAGNOSIS — I10 Essential (primary) hypertension: Secondary | ICD-10-CM | POA: Diagnosis not present

## 2024-05-04 LAB — ECHOCARDIOGRAM COMPLETE
Area-P 1/2: 2.98 cm2
Height: 73 in
S' Lateral: 4.8 cm
Weight: 3968 [oz_av]

## 2024-05-04 MED ORDER — AMLODIPINE BESYLATE 10 MG PO TABS: 10.0000 mg | ORAL_TABLET | Freq: Every day | ORAL | 0 refills | Status: AC

## 2024-05-04 MED ORDER — AMLODIPINE BESYLATE 10 MG PO TABS: 10.0000 mg | ORAL_TABLET | Freq: Every day | ORAL | 0 refills | Status: DC

## 2024-05-04 MED ORDER — ISOSORB DINITRATE-HYDRALAZINE 20-37.5 MG PO TABS: 2.0000 | ORAL_TABLET | Freq: Three times a day (TID) | ORAL | 0 refills | Status: AC

## 2024-05-04 MED ORDER — TORSEMIDE 20 MG PO TABS: ORAL_TABLET | ORAL | 1 refills | Status: AC

## 2024-05-04 MED ORDER — LOSARTAN POTASSIUM 25 MG PO TABS: 25.0000 mg | ORAL_TABLET | Freq: Every day | ORAL | 1 refills | Status: DC

## 2024-05-04 MED ORDER — ISOSORB DINITRATE-HYDRALAZINE 20-37.5 MG PO TABS: 2.0000 | ORAL_TABLET | Freq: Three times a day (TID) | ORAL | 0 refills | Status: DC

## 2024-05-04 MED ORDER — CABOTEGRAVIR & RILPIVIRINE ER 600 & 900 MG/3ML IM SUER
1.0000 | Freq: Once | INTRAMUSCULAR | Status: AC
  Administered 2024-05-04: 1 via INTRAMUSCULAR

## 2024-05-04 MED ORDER — CARVEDILOL 25 MG PO TABS: 25.0000 mg | ORAL_TABLET | Freq: Two times a day (BID) | ORAL | 1 refills | Status: AC

## 2024-05-04 NOTE — Patient Instructions (Signed)
 MUST restart bidil  - take 2 tabs THREE TIMES DAILY.  Read your med list attached to this form and please ensure you fill and take all meds as ordered. Please get the renal artery ultrasound completed.  I will complete your FMLA paperwork. Please return in 1 week for follow up and recheck. It is VERY IMPORTANT that you keep your cardiology appointment.  We will discuss possible weight loss medication upon review of your labs.

## 2024-05-04 NOTE — Progress Notes (Unsigned)
 Established Patient Office Visit  Subjective:  Patient ID: [name], male    DOB: 09-08-1986  Age: 38 y.o. MRN: [id number]  Chief Complaint  Patient presents with  . FMLA  . Hypertension    HPI  Discussed the use of AI scribe software for clinical note transcription with the patient, who gave verbal consent to proceed.  History of Present Illness   [name] is a 38 year old male with hypertension and chronic kidney disease who presents for paperwork and medication management.  He is experiencing difficulty managing his blood pressure despite adherence to amlodipine  10 mg, carvedilol  25 mg twice daily, and torsemide  20 mg. His blood pressure remains elevated, potentially due to the discontinuation of hydralazine  one to two months ago because of pharmacy and insurance issues. No chest pain, headache, blurred vision, shortness of breath, or swelling in the ankles. He reports a cough at home but feels generally well during the visit.  He has chronic kidney disease with a GFR of 19. He last saw nephrology for preoperative clearance, and an ultrasound of his kidney was performed in January, which did not include an assessment of the renal artery.  He is currently on Cabenuva  injections for infectious disease management, receiving one injection every two months, with the last injection being two months ago.  He has a history of smoking marijuana, which he quit two years ago. He denies any use of nicotine or alcohol.  He is in the process of managing his FMLA paperwork, which he believed was valid for a year but was informed it needed resubmission after three months. He requires this for attending medical appointments.       Patient Active Problem List   Diagnosis Date Noted  . HIV disease (HCC) 02/20/2024  . Medication management 02/20/2024  . Chronic systolic congestive heart failure (HCC) 12/14/2023  . Essential hypertension 12/14/2023  . High serum phosphorus for age  67/07/2024  . Stage 4 chronic kidney disease (HCC) 12/14/2023  . Nasal congestion with rhinorrhea 11/02/2023  . Snoring 11/02/2023  . Need for meningococcal vaccination 10/23/2023  . Medication monitoring encounter 10/21/2023  . Acute pulmonary edema (HCC) 10/19/2023  . Anasarca 10/16/2023  . Hypertensive emergency 10/16/2023  . Acute heart failure (HCC) 10/16/2023  . Acute renal failure superimposed on stage 3b chronic kidney disease (HCC) 10/16/2023  . Cough 10/16/2023  . SIRS (systemic inflammatory response syndrome) (HCC) 10/16/2023  . Screening examination for venereal disease 04/08/2023  . Rectal pain 04/08/2023  . Encounter for long-term (current) use of medications 04/08/2023  . Latent syphilis 11/11/2022  . Nasal septal deviation 04/05/2020  . Renal insufficiency 06/15/2018  . Healthcare maintenance 11/04/2017  . Depression 10/27/2017  . Former smoker 12/08/2013  . Positive hepatitis C antibody test 11/18/2013  . Thrombocytopenia, unspecified (HCC) 11/17/2013  . Epistaxis 11/17/2013  . Normocytic anemia 11/17/2013  . Chronic otitis media 11/17/2013  . Transaminitis 11/17/2013  . Nasopharyngeal mass 11/16/2013   Past Medical History:  Diagnosis Date  . HIV (human immunodeficiency virus infection) (HCC)   . ITP (idiopathic thrombocytopenic purpura) 11/24/2013   Past Surgical History:  Procedure Laterality Date  . ADENOIDECTOMY N/A 11/16/2013   Procedure: ADENOIDECTOMY;  Surgeon: Marlyce Finer, MD;  Location: [facility] OR;  Service: ENT;  Laterality: N/A;  . MYRINGOTOMY WITH TUBE PLACEMENT Bilateral 11/16/2013   Procedure: MYRINGOTOMY WITH TUBE PLACEMENT;  Surgeon: Marlyce Finer, MD;  Location: [facility] OR;  Service: ENT;  Laterality: Bilateral;  . NASAL ENDOSCOPY  WITH EPISTAXIS CONTROL N/A 11/16/2013   Procedure: NASAL ENDOSCOPY with nasopharyngeal biopsy with frozen section and adnoidectomy;  Surgeon: Marlyce Finer, MD;  Location: [facility] OR;  Service: ENT;  Laterality: N/A;  . RIGHT  HEART CATH N/A 10/19/2023   Procedure: RIGHT HEART CATH;  Surgeon: Rolan Ezra RAMAN, MD;  Location: [facility] INVASIVE CV LAB;  Service: Cardiovascular;  Laterality: N/A;   Social History   Tobacco Use  . Smoking status: Former    Current packs/day: 0.00    Average packs/day: 0.1 packs/day for 12.5 years (1.2 ttl pk-yrs)    Types: Cigarettes    Start date: 12/30/2001    Quit date: 06/26/2014    Years since quitting: 9.8  . Smokeless tobacco: Never  . Tobacco comments:    pt states he wants to quit by his birthday in July  Vaping Use  . Vaping status: Never Used  Substance Use Topics  . Alcohol use: No  . Drug use: No      ROS: as noted in HPI  Objective:     BP (!) 198/148   Pulse 70   Ht 6' 1 (1.854 m)   Wt 248 lb (112.5 kg)   SpO2 98%   BMI 32.72 kg/m  BP Readings from Last 3 Encounters:  05/04/24 (!) 198/148  02/17/24 (!) 160/88  02/15/24 (!) 164/117   Wt Readings from Last 3 Encounters:  05/04/24 248 lb (112.5 kg)  02/17/24 241 lb (109.3 kg)  02/15/24 240 lb (108.9 kg)      Physical Exam      The 10-year ASCVD risk score (Arnett DK, et al., 2019) is: 12.2%  Assessment & Plan:  Hypomagnesemia -     Magnesium   Essential hypertension -     amLODIPine  Besylate; Take 1 tablet (10 mg total) by mouth daily.  Dispense: 90 tablet; Refill: 0 -     Carvedilol ; Take 1 tablet (25 mg total) by mouth 2 (two) times daily.  Dispense: 180 tablet; Refill: 1 -     Isosorb Dinitrate-hydrALAZINE ; Take 2 tablets by mouth 3 (three) times daily.  Dispense: 540 tablet; Refill: 0 -     Losartan  Potassium; Take 1 tablet (25 mg total) by mouth daily.  Dispense: 90 tablet; Refill: 1 -     Torsemide ; Take 2 tablets (40mg ) daily if needed for weight gain above 3 pounds from your baseline weight in 24 hours or 5 pounds above your dry weight in a week, check weight daily  Dispense: 180 tablet; Refill: 1 -     CBC with Differential/Platelet -     Hemoglobin A1c -     TSH -     Lipid  panel -     Comprehensive metabolic panel with GFR -     VAS US  RENAL ARTERY DUPLEX; Future  Chronic systolic congestive heart failure (HCC) -     amLODIPine  Besylate; Take 1 tablet (10 mg total) by mouth daily.  Dispense: 90 tablet; Refill: 0 -     Isosorb Dinitrate-hydrALAZINE ; Take 2 tablets by mouth 3 (three) times daily.  Dispense: 540 tablet; Refill: 0 -     Torsemide ; Take 2 tablets (40mg ) daily if needed for weight gain above 3 pounds from your baseline weight in 24 hours or 5 pounds above your dry weight in a week, check weight daily  Dispense: 180 tablet; Refill: 1  Stage 4 chronic kidney disease (HCC) -     CBC with Differential/Platelet -     Hemoglobin A1c -  Comprehensive metabolic panel with GFR -     VAS US  RENAL ARTERY DUPLEX; Future  High serum phosphorus for age -     Phosphorus  Assessment and Plan    Hypertension with chronic kidney disease stage 4 Poorly controlled hypertension due to hydralazine  discontinuation. CKD stage 4 with GFR 19, high risk for renal and cardiovascular complications. - Order renal artery ultrasound to rule out stenosis. - Restart hydralazine : two tablets now, two tonight, continue as prescribed. - Transfer all BP medications [facility]. - Monitor BP closely. - Educated on medication adherence and rebound hypertension.  HIV infection Managed with Cabenuva  injections bi-monthly. Regular kidney function monitoring due to potential impact of HIV and treatment. - Administer Cabenuva  injection today. - Monitor kidney function regularly.  Obesity Management complicated by CKD and cardiovascular risk. Weight loss medication use limited by kidney disease. - Evaluate renal function before considering weight loss medications.         Return in about 1 week (around 05/11/2024).   Benton LITTIE Gave, PA

## 2024-05-04 NOTE — Progress Notes (Signed)
 HPI: [name] is a 38 y.o. male who presents to the [facility] pharmacy clinic for Cabenuva  administration.  Patient Active Problem List   Diagnosis Date Noted   HIV disease (HCC) 02/20/2024   Medication management 02/20/2024   Chronic systolic congestive heart failure (HCC) 12/14/2023   Essential hypertension 12/14/2023   High serum phosphorus for age 38/07/2024   Stage 4 chronic kidney disease (HCC) 12/14/2023   Nasal congestion with rhinorrhea 11/02/2023   Snoring 11/02/2023   Need for meningococcal vaccination 10/23/2023   Medication monitoring encounter 10/21/2023   Acute pulmonary edema (HCC) 10/19/2023   Anasarca 10/16/2023   Hypertensive emergency 10/16/2023   Acute heart failure (HCC) 10/16/2023   Acute renal failure superimposed on stage 3b chronic kidney disease (HCC) 10/16/2023   Cough 10/16/2023   SIRS (systemic inflammatory response syndrome) (HCC) 10/16/2023   Screening examination for venereal disease 04/08/2023   Rectal pain 04/08/2023   Encounter for long-term (current) use of medications 04/08/2023   Latent syphilis 11/11/2022   Nasal septal deviation 04/05/2020   Renal insufficiency 06/15/2018   Healthcare maintenance 11/04/2017   Depression 10/27/2017   Former smoker 12/08/2013   Positive hepatitis C antibody test 11/18/2013   Thrombocytopenia, unspecified (HCC) 11/17/2013   Epistaxis 11/17/2013   Normocytic anemia 11/17/2013   Chronic otitis media 11/17/2013   Transaminitis 11/17/2013   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   AMLODIPINE  (NORVASC ) 10 MG TABLET    Take 1 tablet (10 mg total) by mouth daily.   APIXABAN  (ELIQUIS ) 5 MG TABS TABLET    Take 1 tablet (5 mg total) by mouth 2 (two) times daily.    AZELASTINE -FLUTICASONE  137-50 MCG/ACT SUSP    Place 1 spray into the nose every 12 (twelve) hours.   CABOTEGRAVIR  & RILPIVIRINE  ER (CABENUVA ) 600 & 900 MG/3ML INJECTION    Inject 1 kit into the muscle every 2 (two) months.   CARVEDILOL  (COREG ) 25 MG TABLET    Take 25 mg by mouth 2 (two) times daily.   ISOSORBIDE -HYDRALAZINE  (BIDIL ) 20-37.5 MG TABLET    Take 2 tablets by mouth 3 (three) times daily.   LOSARTAN  (COZAAR ) 25 MG TABLET    Take 25 mg by mouth daily.   TORSEMIDE  (DEMADEX ) 20 MG TABLET    Take 2 tablets (40mg ) daily if needed for weight gain above 3 pounds from your baseline weight in 24 hours or 5 pounds above your dry weight in a week, check weight daily  Modified Medications   No medications on file  Discontinued Medications   No medications on file    Allergies: No Known Allergies  Labs: Lab Results  Component Value Date   HIV1RNAQUANT NOT DETECTED 02/15/2024   HIV1RNAQUANT NOT DETECTED 10/21/2023   HIV1RNAQUANT Not Detected 08/26/2023   CD4TABS 486 02/15/2024   CD4TABS 509 12/03/2022   CD4TABS 383 (L) 06/27/2021    RPR and STI Lab Results  Component Value Date   LABRPR REACTIVE (A) 10/21/2023   LABRPR REACTIVE (A) 04/08/2023   LABRPR REACTIVE (A) 12/03/2022   LABRPR REACTIVE (A) 06/27/2021   LABRPR NON-REACTIVE 03/26/2020   RPRTITER 1:1 (H) 10/21/2023   RPRTITER 1:1 (H) 04/08/2023   RPRTITER 1:2 (H) 12/03/2022   RPRTITER 1:2 (H) 06/27/2021  STI Results GC GC CT CT  Latest Ref Rng & Units  NEGATIVE  NEGATIVE  10/21/2023 10:52 AM Negative    Negative    Negative   Negative    Negative    Negative    04/08/2023 10:25 AM Negative    Negative   Negative    Negative    04/02/2023  8:52 AM Negative   Negative    09/01/2018 12:00 AM Negative   Negative    04/22/2017 12:00 AM Negative   Negative    11/17/2013  2:00 PM  NEGATIVE   NEGATIVE     Hepatitis B Lab Results  Component Value Date   HEPBSAB POSITIVE (A) 11/17/2013   HEPBSAG NEGATIVE 11/18/2013    Hepatitis C Lab Results  Component Value Date   HEPCAB NON-REACTIVE 11/02/2023   Hepatitis A Lab Results  Component Value Date   HAV Reactive (A) 11/17/2013   Lipids: Lab Results  Component Value Date   CHOL 202 (H) 04/08/2023   TRIG 126 04/08/2023   HDL 47 04/08/2023   CHOLHDL 4.3 04/08/2023   VLDL 40 (H) 04/22/2017   LDLCALC 131 (H) 04/08/2023    TARGET DATE: The 21st  Assessment: [name] presents today for his maintenance Cabenuva  injections. Past injections were tolerated well without issues. Last HIV RNA was not detected in May. Out of his target window by 2 days this month. Still ok to continue on current schedule as he is not more than 3 months past his last injection (02/22/24).  Administered cabotegravir  600mg /22mL in left upper outer quadrant of the gluteal muscle. Administered rilpivirine  900 mg/3mL in the right upper outer quadrant of the gluteal muscle. No issues with injections. He will follow up in 2 months for next set of injections.  Plan: - Cabenuva  injections administered - Next injections scheduled for 06/20/24 with me - Call with any issues or questions  [name], PharmD, BCIDP, AAHIVP, CPP Clinical Pharmacist Practitioner - Infectious Diseases Clinical Pharmacist Lead - Specialty Pharmacy [facility] for [facility]

## 2024-05-05 ENCOUNTER — Ambulatory Visit: Payer: Self-pay | Admitting: Urgent Care

## 2024-05-05 LAB — CBC WITH DIFFERENTIAL/PLATELET
Basophils Absolute: 0 x10E3/uL (ref 0.0–0.2)
Basos: 1 %
EOS (ABSOLUTE): 0.1 x10E3/uL (ref 0.0–0.4)
Eos: 2 %
Hematocrit: 39.3 % (ref 37.5–51.0)
Hemoglobin: 12.5 g/dL — ABNORMAL LOW (ref 13.0–17.7)
Immature Grans (Abs): 0 x10E3/uL (ref 0.0–0.1)
Immature Granulocytes: 0 %
Lymphocytes Absolute: 1.8 x10E3/uL (ref 0.7–3.1)
Lymphs: 29 %
MCH: 28.2 pg (ref 26.6–33.0)
MCHC: 31.8 g/dL (ref 31.5–35.7)
MCV: 89 fL (ref 79–97)
Monocytes Absolute: 0.6 x10E3/uL (ref 0.1–0.9)
Monocytes: 10 %
Neutrophils Absolute: 3.6 x10E3/uL (ref 1.4–7.0)
Neutrophils: 58 %
Platelets: 144 x10E3/uL — ABNORMAL LOW (ref 150–450)
RBC: 4.43 x10E6/uL (ref 4.14–5.80)
RDW: 13.6 % (ref 11.6–15.4)
WBC: 6.2 x10E3/uL (ref 3.4–10.8)

## 2024-05-05 LAB — COMPREHENSIVE METABOLIC PANEL WITH GFR
ALT: 11 IU/L (ref 0–44)
AST: 24 IU/L (ref 0–40)
Albumin: 3.6 g/dL — ABNORMAL LOW (ref 4.1–5.1)
Alkaline Phosphatase: 124 IU/L — ABNORMAL HIGH (ref 44–121)
BUN/Creatinine Ratio: 7 — ABNORMAL LOW (ref 9–20)
BUN: 33 mg/dL — ABNORMAL HIGH (ref 6–24)
Bilirubin Total: 1.1 mg/dL (ref 0.0–1.2)
CO2: 19 mmol/L — ABNORMAL LOW (ref 20–29)
Calcium: 9.1 mg/dL (ref 8.7–10.2)
Chloride: 104 mmol/L (ref 96–106)
Creatinine, Ser: 4.45 mg/dL — ABNORMAL HIGH (ref 0.76–1.27)
Globulin, Total: 3.4 g/dL (ref 1.5–4.5)
Glucose: 75 mg/dL (ref 70–99)
Potassium: 5 mmol/L (ref 3.5–5.2)
Sodium: 138 mmol/L (ref 134–144)
Total Protein: 7 g/dL (ref 6.0–8.5)
eGFR: 16 mL/min/1.73 — ABNORMAL LOW (ref >59)

## 2024-05-05 LAB — LIPID PANEL
Chol/HDL Ratio: 5 ratio (ref 0.0–5.0)
Cholesterol, Total: 235 mg/dL — ABNORMAL HIGH (ref 100–199)
HDL: 47 mg/dL (ref >39)
LDL Chol Calc (NIH): 166 mg/dL — ABNORMAL HIGH (ref 0–99)
Triglycerides: 123 mg/dL (ref 0–149)
VLDL Cholesterol Cal: 22 mg/dL (ref 5–40)

## 2024-05-05 LAB — MAGNESIUM: Magnesium: 1.2 mg/dL — ABNORMAL LOW (ref 1.6–2.3)

## 2024-05-05 LAB — HEMOGLOBIN A1C
Est. average glucose Bld gHb Est-mCnc: 103 mg/dL
Hgb A1c MFr Bld: 5.2 % (ref 4.8–5.6)

## 2024-05-05 LAB — PHOSPHORUS: Phosphorus: 3.3 mg/dL (ref 2.8–4.1)

## 2024-05-05 LAB — TSH: TSH: 1.66 u[IU]/mL (ref 0.450–4.500)

## 2024-05-06 ENCOUNTER — Telehealth (HOSPITAL_COMMUNITY): Payer: Self-pay | Admitting: Cardiology

## 2024-05-06 NOTE — Telephone Encounter (Signed)
 Called to confirm/remind patient of their appointment at the [facility] on 05/06/24 .   Appointment:   [] Confirmed  [x] Left mess   [] No answer/No voice mail  [] VM Full/unable to leave message  [] Phone not in service  Patient reminded to bring all medications and/or complete list.  Confirmed patient has transportation. Gave directions, instructed to utilize valet parking.

## 2024-05-06 NOTE — Telephone Encounter (Signed)
 Called Dr. Bernardino Gasman and left a detailed message with his nurse as noted below by Whitney crain. Left return  contact # and direct return contact # as well.

## 2024-05-08 NOTE — Progress Notes (Signed)
 ADVANCED HEART FAILURE FOLLOW UP CLINIC NOTE  Referring Physician: Lowella Benton CROME, PA  Primary Care: Lowella Benton CROME, [place] Primary Cardiologist:  HPI: [name] is a 38 y.o. male who presents for follow up of chronic systolic heart failure.      Admitted 1/25 with new systolic heart failure and hypertensive urgency, BP 172/131 on arrival. Diuresed and BP treated. Echo EF severely reduced LVEF < 20% global HK and RV mildly reduced. Underwent RHC showing low filling pressures and preserved CO. No LHC with advanced CKD and no evidence of ACS. cMRI showed LVEF 18% with prominent apical trabeculations, RVEF 27%, mild to mod TR. Concern for non compaction CM. GDMT titrated, but limited by CKD. He was discharged home, weight 229 lbs.      SUBJECTIVE:  Reviewed recent echocardiogram at length.  Patient overall is doing well, NYHA class I-II symptoms, is able to carry out all his activities of daily living without issue, and denies any lower extremity swelling, orthopnea, PND.  He has been following up with nephrology and unfortunately has seen progressive worsening of his renal function.  He has not yet discussed dialysis and appears to be making good urine. We discussed genetic testing today given diagnosis of LVNC. Blood pressure elevated in clinic today.   PMH, current medications, allergies, social history, and family history reviewed in epic.  PHYSICAL EXAM: Vitals:   05/09/24 1019  BP: (!) 160/80  Pulse: 67  SpO2: 96%   GENERAL: Well nourished and in no apparent distress at rest.  PULM:  Normal work of breathing, clear to auscultation bilaterally. Respirations are unlabored.  CARDIAC:  JVP: flat         Normal rate with regular rhythm. No murmurs, rubs or gallops.  No edema. Warm and well perfused extremities. ABDOMEN: Soft, non-tender, non-distended. NEUROLOGIC: Patient is oriented x3 with no focal or lateralizing neurologic deficits.    DATA REVIEW  ECG: 02/17/2024:  NSR, normal QRS  ECHO: Echo (1/25): EF < 20%, mild LVH, G2DD, RV mildly reduced    CATH: RHC (1/25): RA 1, PA 11/4 (8), PCWP 1, CO/CI (Fick) 2.48/5.57, SVR 1020    cMRI (1/25): LVEF 18% with prominent apical trabeculations, RVEF 27%, mild to mod TR. Prominent apical trabeculations, concern for non compaction CM.   Heart failure review: - Classification: Heart failure with reduced EF - Etiology: HTN related and Cardiomyopathy due to LVNC - NYHA Class: II - Volume status: Euvolemic - ACEi/ARB/ARNI: Intolerant - Aldosterone antagonist: Intolerant - Beta-blocker: Maximally tolerated dose - Digoxin: Not a candidate - Hydralazine /Nitrates: Maximally tolerated dose - SGLT2i: Consider in future - Advanced therapies: Not needed at this time - ICD: Currently uptitrating GDMT  ASSESSMENT & PLAN:  Chronic systolic heart failure: Suspect primarily due to LVNC as well as component of hypertensive cardiomyopathy, minimal improvement on echo but BP is not well controlled. Extensive discussion had today about ICD, patient concerned about implantation and would like to give his heart more time to recover.  We discussed the risks of this approach but given lack of LGE on MRI and potential need for dialysis access in the future this is reasonable.  - Start doxazosin  1 mg daily, follow-up closely with nephrology as he will likely need further titration - Continue amlodipine  10 mg daily - Continue Coreg  25 mg twice daily - Continue BiDil  2 tablets 3 times daily - Continue torsemide  20mg  daily - Avoid ACEi/ARB/ARNI - Defer ICD until repeat echo after BP controlled - Continue  apixaban  5mg  BID given LVNC and reduced EF  CKD: Stage IV, suspect primarily due to HTN.  - Follows with nephrology  HTN: Severe, uncontrolled, workup largely negative for secondary causes. - Treatment as above  OSA:  - Needs sleep study  Follow up in 3 months  Morene Brownie, MD Advanced Heart Failure Mechanical  Circulatory Support 05/12/24

## 2024-05-09 ENCOUNTER — Encounter (HOSPITAL_COMMUNITY): Payer: Self-pay | Admitting: Cardiology

## 2024-05-09 ENCOUNTER — Ambulatory Visit (HOSPITAL_BASED_OUTPATIENT_CLINIC_OR_DEPARTMENT_OTHER)
Admission: RE | Admit: 2024-05-09 | Discharge: 2024-05-09 | Disposition: A | Discharge status: Home / Self Care | Source: Ambulatory Visit | Attending: Urgent Care | Admitting: Urgent Care

## 2024-05-09 ENCOUNTER — Ambulatory Visit (HOSPITAL_COMMUNITY)
Admission: RE | Admit: 2024-05-09 | Discharge: 2024-05-09 | Disposition: A | Discharge status: Home / Self Care | Source: Ambulatory Visit | Attending: Cardiology | Admitting: Cardiology

## 2024-05-09 VITALS — BP 160/80 | HR 67 | Wt 252.6 lb

## 2024-05-09 DIAGNOSIS — I129 Hypertensive chronic kidney disease with stage 1 through stage 4 chronic kidney disease, or unspecified chronic kidney disease: Secondary | ICD-10-CM | POA: Diagnosis not present

## 2024-05-09 DIAGNOSIS — N1832 Chronic kidney disease, stage 3b: Secondary | ICD-10-CM | POA: Diagnosis not present

## 2024-05-09 DIAGNOSIS — N184 Chronic kidney disease, stage 4 (severe): Secondary | ICD-10-CM | POA: Insufficient documentation

## 2024-05-09 DIAGNOSIS — I5022 Chronic systolic (congestive) heart failure: Secondary | ICD-10-CM | POA: Diagnosis not present

## 2024-05-09 DIAGNOSIS — I10 Essential (primary) hypertension: Secondary | ICD-10-CM

## 2024-05-09 MED ORDER — DOXAZOSIN MESYLATE 1 MG PO TABS: 1.0000 mg | ORAL_TABLET | Freq: Every day | ORAL | 11 refills | Status: AC

## 2024-05-09 NOTE — Patient Instructions (Signed)
 STOP Losartan   START Cardura  1 mg.  Please follow up with our heart failure pharmacist as scheduled.  Your physician recommends that you schedule a follow-up appointment in: 2 months ( October) ** PLEASE CALL THE OFFICE IN 3 WEEKS TO ARRANGE YOUR FOLLOW UP APPOINTMENT.**  If you have any questions or concerns before your next appointment please send us  a message through [place] or call our office at [phone].    TO LEAVE A MESSAGE FOR THE NURSE SELECT OPTION 2, PLEASE LEAVE A MESSAGE INCLUDING: YOUR NAME DATE OF BIRTH CALL BACK NUMBER REASON FOR CALL**this is important as we prioritize the call backs  YOU WILL RECEIVE A CALL BACK THE SAME DAY AS LONG AS YOU CALL BEFORE 4:00 PM  At the [facility], you and your health needs are our priority. As part of our continuing mission to provide you with exceptional heart care, we have created designated Provider Care Teams. These Care Teams include your primary Cardiologist (physician) and Advanced Practice Providers (APPs- Physician Assistants and Nurse Practitioners) who all work together to provide you with the care you need, when you need it.   You may see any of the following providers on your designated Care Team at your next follow up: Dr Toribio Fuel Dr Ezra Shuck Dr. Ria Commander Dr. Morene Brownie Amy Lenetta, NP Caffie Shed, [place] [facility], [place] Beckey Coe, NP [place] Lee, NP Ellouise Class, NP Tinnie Redman, PharmD Jaun Bash, PharmD   Please be sure to bring in all your medications bottles to every appointment.    Thank you for choosing [facility]

## 2024-05-10 ENCOUNTER — Telehealth: Payer: Self-pay

## 2024-05-10 NOTE — Telephone Encounter (Signed)
 Copied from CRM #[id number]. Topic: General - Other >> May 10, 2024 10:56 AM Susanna ORN wrote: Reason for CRM: Patient returning Christal's call. He stated to give him a call around 12 or 1, as he's currently at work. CB # [phone].

## 2024-05-10 NOTE — Telephone Encounter (Signed)
 Attempted to reach pt by phone, left msg for a return call.

## 2024-05-10 NOTE — Telephone Encounter (Signed)
 Called patient and LVM to inform of [employer] paperwork that's ready to be picked up, and $29 fee, thanks.

## 2024-05-11 ENCOUNTER — Ambulatory Visit (INDEPENDENT_AMBULATORY_CARE_PROVIDER_SITE_OTHER): Admitting: Urgent Care

## 2024-05-11 VITALS — BP 165/121 | HR 83 | Resp 16 | Ht 73.0 in | Wt 253.2 lb

## 2024-05-11 DIAGNOSIS — N184 Chronic kidney disease, stage 4 (severe): Secondary | ICD-10-CM | POA: Diagnosis not present

## 2024-05-11 DIAGNOSIS — I5022 Chronic systolic (congestive) heart failure: Secondary | ICD-10-CM

## 2024-05-11 DIAGNOSIS — B20 Human immunodeficiency virus [HIV] disease: Secondary | ICD-10-CM | POA: Diagnosis not present

## 2024-05-11 DIAGNOSIS — I10 Essential (primary) hypertension: Secondary | ICD-10-CM | POA: Diagnosis not present

## 2024-05-11 NOTE — Patient Instructions (Signed)
 Please call [phone] TODAY! You must be seen within the next week with nephrology.

## 2024-05-11 NOTE — Progress Notes (Unsigned)
   Established Patient Office Visit  Subjective:  Patient ID: [name], male    DOB: Apr 15, 1986  Age: 38 y.o. MRN: [id number]  Chief Complaint  Patient presents with   Medical Management of Chronic Issues    1 wk HTN f/u    HPI  {History (Optional):23778}  ROS: as noted in HPI  Objective:     There were no vitals taken for this visit. {Vitals History (Optional):23777}  Physical Exam   No results found for any visits on 05/11/24.  {Labs (Optional):23779}  The 10-year ASCVD risk score (Arnett DK, et al., 2019) is: 8.3%  Assessment & Plan:  Stage 4 chronic kidney disease (HCC) -     Basic metabolic panel with GFR     No follow-ups on file.   Benton LITTIE Gave, PA

## 2024-05-12 ENCOUNTER — Ambulatory Visit: Payer: Self-pay | Admitting: Urgent Care

## 2024-05-12 ENCOUNTER — Encounter: Payer: Self-pay | Admitting: Urgent Care

## 2024-05-12 LAB — BASIC METABOLIC PANEL WITH GFR
BUN/Creatinine Ratio: 6 — ABNORMAL LOW (ref 9–20)
BUN: 29 mg/dL — ABNORMAL HIGH (ref 6–24)
CO2: 19 mmol/L — ABNORMAL LOW (ref 20–29)
Calcium: 8.6 mg/dL — ABNORMAL LOW (ref 8.7–10.2)
Chloride: 104 mmol/L (ref 96–106)
Creatinine, Ser: 4.49 mg/dL — ABNORMAL HIGH (ref 0.76–1.27)
Glucose: 96 mg/dL (ref 70–99)
Potassium: 4.7 mmol/L (ref 3.5–5.2)
Sodium: 139 mmol/L (ref 134–144)
eGFR: 16 mL/min/1.73 — ABNORMAL LOW (ref >59)

## 2024-05-29 NOTE — Progress Notes (Incomplete)
 ***In Progress***    Advanced Heart Failure Clinic Note   Referring Physician: Lowella Benton CROME, PA  Primary Care: Lowella Benton CROME, [place] Primary HF Cardiologist: Zenaida Morene PARAS, MD  HPI:  [name] is a 38 y.o. male who presents for follow up of chronic systolic heart failure   Admitted 10/2023 with new systolic heart failure and hypertensive urgency, BP 172/131 on arrival. Diuresed and BP treated. Echo EF severely reduced LVEF < 20% global HK and RV mildly reduced. Underwent RHC showing low filling pressures and preserved CO. No LHC with advanced CKD and no evidence of ACS. cMRI showed LVEF 18% with prominent apical trabeculations, RVEF 27%, mild to mod TR. Concern for non compaction CM. GDMT titrated, but limited by CKD. He was discharged home, weight 229 lbs   Patient presented to HF clinic on 05/09/24 for an appointment with Dr. Zenaida. He was able to carry out all his activities of daily living without issue, and denied any lower extremity swelling, orthopnea, PND.  He had been following up with nephrology and unfortunately had seen progressive worsening of his renal function.  He had not yet discussed dialysis and appeared to be making good urine. Genetic testing was discussed given diagnosis of LVNC. Blood pressure was elevated in clinic to 160/80 mmHg.   Today he returns to HF clinic for pharmacist medication titration. At last visit with MD, doxazosin  1 mg daily was started. ***  Shortness of breath/dyspnea on exertion? {YES NO:22349}  Orthopnea/PND? {YES NO:22349} Edema? {YES NO:22349} Lightheadedness/dizziness? {YES NO:22349} Daily weights at home? {YES NO:22349} Blood pressure/heart rate monitoring at home? {YES [id number] Following low-sodium/fluid-restricted diet? {YES NO:22349}  HF Medications: Carvedilol  25 mg BID BiDil  2 tablets TID Torsemide  20 mg daily *** (or BID?) Doxazosin  1 mg daily Amlodipine  10 mg daily  Has the patient been experiencing any side effects  to the medications prescribed?  {YES NO:22349}  Does the patient have any problems obtaining medications due to transportation or finances?   {YES NO:22349}  Understanding of regimen: {excellent/good/fair/poor:19665} Understanding of indications: {excellent/good/fair/poor:19665} Potential of compliance: {excellent/good/fair/poor:19665} Patient understands to avoid NSAIDs. Patient understands to avoid decongestants.    Pertinent Lab Values: (05/11/24) Serum creatinine 4.49 mg/dL, BUN 29 mg/dL, Potassium 4.7 mmol/L, Sodium 139 mmol/L, BNP 193.1 pg/mL (02/17/24), Magnesium  1.2 mmol/L (05/04/24)***  Vital Signs: Weight: *** (last clinic weight: 252 lbs) Blood pressure: *** (last clinic BP: 160/80 mmHg) *** Heart rate: *** (last clinic HR: 67 bpm) ***  Assessment/Plan: 1. Chronic systolic heart failure: Suspect primarily due to LVNC as well as component of hypertensive cardiomyopathy, minimal improvement on echo but BP not well controlled. Extensive discussion about ICD with Dr. Zenaida on 05/09/24, patient concerned about implantation and would like to give his heart more time to recover. Dr. Zenaida discussed the risks of this approach but given lack of LGE on MRI and potential need for dialysis access in the future this was reasonable. - Start doxazosin  1 mg daily, follow-up closely with nephrology as he will likely need further titration*** - Continue amlodipine  10 mg daily*** - Continue Coreg  25 mg twice daily*** - Continue BiDil  2 tablets 3 times daily*** - Continue torsemide  20mg  daily*** - Avoid ACEi/ARB/ARNI*** - Defer ICD until repeat echo after BP controlled*** - Continue apixaban  5mg  BID given LVNC and reduced EF***   2. CKD: Stage IV, suspect primarily due to HTN.  - Follows with nephrology   3. HTN: Severe, uncontrolled, workup largely negative for secondary causes. - Treatment as above  4. OSA:  - Needs sleep study  Follow up ***  Morna Breach, PharmD PGY2 Cardiology  Pharmacy Resident

## 2024-05-30 ENCOUNTER — Inpatient Hospital Stay (HOSPITAL_COMMUNITY)
Admission: RE | Admit: 2024-05-30 | Discharge: 2024-05-30 | Disposition: A | Discharge status: Home / Self Care | Source: Ambulatory Visit

## 2024-06-13 ENCOUNTER — Encounter: Payer: Self-pay | Admitting: Urgent Care

## 2024-06-13 ENCOUNTER — Telehealth: Payer: Self-pay

## 2024-06-13 NOTE — Telephone Encounter (Signed)
 Received

## 2024-06-13 NOTE — Telephone Encounter (Signed)
 Patient came into office to drop off Provider's Certification for Employees own condition form to be completed by PCP, forms placed in [place] box, thanks.

## 2024-06-17 NOTE — Progress Notes (Signed)
 HPI: [name] is a 38 y.o. male who presents to the [facility] pharmacy clinic for Cabenuva  administration.  Patient Active Problem List   Diagnosis Date Noted   HIV disease (HCC) 02/20/2024   Medication management 02/20/2024   Chronic systolic congestive heart failure (HCC) 12/14/2023   Essential hypertension 12/14/2023   High serum phosphorus for age 31/07/2024   Stage 4 chronic kidney disease (HCC) 12/14/2023   Nasal congestion with rhinorrhea 11/02/2023   Snoring 11/02/2023   Need for meningococcal vaccination 10/23/2023   Medication monitoring encounter 10/21/2023   Acute pulmonary edema (HCC) 10/19/2023   Anasarca 10/16/2023   Hypertensive emergency 10/16/2023   Acute heart failure (HCC) 10/16/2023   Acute renal failure superimposed on stage 3b chronic kidney disease (HCC) 10/16/2023   Cough 10/16/2023   SIRS (systemic inflammatory response syndrome) (HCC) 10/16/2023   Screening examination for venereal disease 04/08/2023   Rectal pain 04/08/2023   Encounter for long-term (current) use of medications 04/08/2023   Latent syphilis 11/11/2022   Nasal septal deviation 04/05/2020   Renal insufficiency 06/15/2018   Healthcare maintenance 11/04/2017   Depression 10/27/2017   Former smoker 12/08/2013   Positive hepatitis C antibody test 11/18/2013   Thrombocytopenia, unspecified (HCC) 11/17/2013   Epistaxis 11/17/2013   Normocytic anemia 11/17/2013   Chronic otitis media 11/17/2013   Transaminitis 11/17/2013   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   APIXABAN  (ELIQUIS ) 5 MG TABS TABLET    Take 1 tablet (5 mg total) by mouth 2 (two) times daily.   CABOTEGRAVIR  & RILPIVIRINE  ER (CABENUVA ) 600 & 900 MG/3ML INJECTION    Inject 1 kit into the muscle every 2 (two) months.   CARVEDILOL  (COREG ) 25 MG TABLET     Take 1 tablet (25 mg total) by mouth 2 (two) times daily.   DOXAZOSIN  (CARDURA ) 1 MG TABLET    Take 1 tablet (1 mg total) by mouth daily.   ISOSORBIDE -HYDRALAZINE  (BIDIL ) 20-37.5 MG TABLET    Take 2 tablets by mouth 3 (three) times daily.   TORSEMIDE  (DEMADEX ) 20 MG TABLET    Take 2 tablets (40mg ) daily if needed for weight gain above 3 pounds from your baseline weight in 24 hours or 5 pounds above your dry weight in a week, check weight daily  Modified Medications   No medications on file  Discontinued Medications   No medications on file    Allergies: No Known Allergies  Labs: Lab Results  Component Value Date   HIV1RNAQUANT NOT DETECTED 02/15/2024   HIV1RNAQUANT NOT DETECTED 10/21/2023   HIV1RNAQUANT Not Detected 08/26/2023   CD4TABS 486 02/15/2024   CD4TABS 509 12/03/2022   CD4TABS 383 (L) 06/27/2021    RPR and STI Lab Results  Component Value Date   LABRPR REACTIVE (A) 10/21/2023   LABRPR REACTIVE (A) 04/08/2023   LABRPR REACTIVE (A) 12/03/2022   LABRPR REACTIVE (A) 06/27/2021   LABRPR NON-REACTIVE 03/26/2020   RPRTITER 1:1 (H) 10/21/2023   RPRTITER 1:1 (H) 04/08/2023   RPRTITER 1:2 (H) 12/03/2022   RPRTITER 1:2 (H) 06/27/2021    STI Results GC GC CT CT  Latest Ref Rng & Units  NEGATIVE  NEGATIVE  10/21/2023 10:52 AM Negative    Negative    Negative   Negative    Negative    Negative  04/08/2023 10:25 AM Negative    Negative   Negative    Negative    04/02/2023  8:52 AM Negative   Negative    09/01/2018 12:00 AM Negative   Negative    04/22/2017 12:00 AM Negative   Negative    11/17/2013  2:00 PM  NEGATIVE   NEGATIVE     Hepatitis B Lab Results  Component Value Date   HEPBSAB POSITIVE (A) 11/17/2013   HEPBSAG NEGATIVE 11/18/2013   Hepatitis C Lab Results  Component Value Date   HEPCAB NON-REACTIVE 11/02/2023   Hepatitis A Lab Results  Component Value Date   HAV Reactive (A) 11/17/2013   Lipids: Lab Results  Component Value Date   CHOL  235 (H) 05/04/2024   TRIG 123 05/04/2024   HDL 47 05/04/2024   CHOLHDL 5.0 05/04/2024   VLDL 40 (H) 04/22/2017   LDLCALC 166 (H) 05/04/2024    TARGET DATE: The 21st  Assessment: [name] presents today for his maintenance Cabenuva  injections. Past injections were tolerated well without issues. Last HIV RNA on 02/15/24, non-detectable. He is stable on his Cabenuva  and will be due for additional labs in January. Last STI screening on 10/21/23, negative. He politely declines testing today, denies recent exposure or symptoms. Lipid panel obtained on 05/04/24 with an LDL of 166, he would benefit from statin initiation. Recommended follow-up with his PCP.   Due for influenza and shingles vaccines. He politely declines vaccines today, will defer to next visit.    Administered cabotegravir  600mg /31mL in left upper outer quadrant of the gluteal muscle. Administered rilpivirine  900 mg/3mL in the right upper outer quadrant of the gluteal muscle. No issues with injections. He will follow up in 2 months for next set of injections.  Plan: Cabenuva  injections administered Next injections scheduled for 08/23/24 with Sabina Manandhar and 10/24/24 with pharmacy Call with any issues or questions  [name], PharmD PGY1 Pharmacy Resident  06/20/2024

## 2024-06-20 ENCOUNTER — Other Ambulatory Visit: Payer: Self-pay

## 2024-06-20 ENCOUNTER — Ambulatory Visit: Admitting: Pharmacist

## 2024-06-20 ENCOUNTER — Other Ambulatory Visit (HOSPITAL_COMMUNITY): Payer: Self-pay

## 2024-06-20 ENCOUNTER — Telehealth: Payer: Self-pay

## 2024-06-20 ENCOUNTER — Other Ambulatory Visit: Payer: Self-pay | Admitting: Pharmacist

## 2024-06-20 DIAGNOSIS — B20 Human immunodeficiency virus [HIV] disease: Secondary | ICD-10-CM

## 2024-06-20 MED ORDER — CABOTEGRAVIR & RILPIVIRINE ER 600 & 900 MG/3ML IM SUER
1.0000 | INTRAMUSCULAR | 5 refills | Status: AC
  Filled 2024-06-20: qty 6, 60d supply, fill #0
  Filled 2024-08-05: qty 6, 60d supply, fill #1
  Filled 2024-10-18: qty 6, 60d supply, fill #2

## 2024-06-20 MED ORDER — CABOTEGRAVIR & RILPIVIRINE ER 600 & 900 MG/3ML IM SUER
1.0000 | Freq: Once | INTRAMUSCULAR | Status: AC
  Administered 2024-06-20: 1 via INTRAMUSCULAR

## 2024-06-20 NOTE — Progress Notes (Signed)
 Specialty Pharmacy Refill Coordination Note  GARELD OBRECHT is a 38 y.o. male assessed today regarding refills of clinic administered specialty medication(s) Cabotegravir  & Rilpivirine  (CABENUVA )   Clinic requested Courier to Provider Office   Delivery date: 06/21/24   Verified address: [address] [place] [address]   Medication will be filled on 06/20/24.

## 2024-06-20 NOTE — Telephone Encounter (Signed)
 Pharmacy Patient Advocate Encounter- Cabenuva  BIV-Pharmacy Benefit:  PA was submitted to [facility] and has been approved through: 06/30/24-06/20/25 Authorization# [id number]  Please send prescription to Specialty Pharmacy: [facility]: [phone]  Estimated Copay is: 0.00

## 2024-06-20 NOTE — Telephone Encounter (Signed)
 error

## 2024-06-21 ENCOUNTER — Telehealth: Payer: Self-pay

## 2024-06-21 NOTE — Telephone Encounter (Signed)
 [facility] Patient Advocate Encounter  Patient's medications CABENUVA  have been couriered to [facility] from [facility] and will be administered at the patients appointment on 06/20/24.  Charmaine Sharps, CPhT Specialty Pharmacy Patient [facility] for [facility]ious [facility] Phone: [phone] Fax:  [phone]

## 2024-06-24 NOTE — Telephone Encounter (Signed)
 These forms were given to Tajai at front desk.

## 2024-06-25 NOTE — Addendum Note (Signed)
 Encounter addended by: Serena Morna SAUNDERS, [facility] on: 06/25/2024 6:33 PM  Actions taken: Delete clinical note

## 2024-08-05 ENCOUNTER — Other Ambulatory Visit: Payer: Self-pay

## 2024-08-05 ENCOUNTER — Other Ambulatory Visit (HOSPITAL_COMMUNITY): Payer: Self-pay

## 2024-08-05 NOTE — Progress Notes (Signed)
 Specialty Pharmacy Refill Coordination Note  [name] is a 38 y.o. male assessed today regarding refills of clinic administered specialty medication(s) Cabotegravir  & Rilpivirine  (CABENUVA )   Clinic requested Courier to Provider Office   Delivery date: 08/18/24   Verified address: [address] [place] [address]   Medication will be filled on 08/17/24.

## 2024-08-17 ENCOUNTER — Other Ambulatory Visit: Payer: Self-pay

## 2024-08-18 ENCOUNTER — Telehealth: Payer: Self-pay

## 2024-08-18 NOTE — Telephone Encounter (Signed)
 [facility] Patient Advocate Encounter  Patient's medications CABENUVA  have been couriered to [facility] from [facility] and will be administered at the patients appointment on 06/20/24.  Charmaine Sharps, CPhT Specialty Pharmacy Patient [facility] for Infectious Disease Phone: [phone] Fax:  [phone]

## 2024-08-23 ENCOUNTER — Ambulatory Visit: Admitting: Infectious Diseases

## 2024-08-23 ENCOUNTER — Ambulatory Visit (INDEPENDENT_AMBULATORY_CARE_PROVIDER_SITE_OTHER): Admitting: Pharmacist

## 2024-08-23 ENCOUNTER — Other Ambulatory Visit: Payer: Self-pay

## 2024-08-23 DIAGNOSIS — B20 Human immunodeficiency virus [HIV] disease: Secondary | ICD-10-CM | POA: Diagnosis not present

## 2024-08-23 MED ORDER — CABOTEGRAVIR & RILPIVIRINE ER 600 & 900 MG/3ML IM SUER
1.0000 | Freq: Once | INTRAMUSCULAR | Status: AC
  Administered 2024-08-23: 1 via INTRAMUSCULAR

## 2024-08-23 NOTE — Progress Notes (Signed)
 HPI: [name] is a 38 y.o. male who presents to the [facility] pharmacy clinic for Cabenuva  administration.  Referring ID Physician: Dr. Dea  Patient Active Problem List   Diagnosis Date Noted   HIV disease (HCC) 02/20/2024   Medication management 02/20/2024   Chronic systolic congestive heart failure (HCC) 12/14/2023   Essential hypertension 12/14/2023   High serum phosphorus for age 21/07/2024   Stage 4 chronic kidney disease (HCC) 12/14/2023   Nasal congestion with rhinorrhea 11/02/2023   Snoring 11/02/2023   Need for meningococcal vaccination 10/23/2023   Medication monitoring encounter 10/21/2023   Acute pulmonary edema (HCC) 10/19/2023   Anasarca 10/16/2023   Hypertensive emergency 10/16/2023   Acute heart failure (HCC) 10/16/2023   Acute renal failure superimposed on stage 3b chronic kidney disease (HCC) 10/16/2023   Cough 10/16/2023   SIRS (systemic inflammatory response syndrome) (HCC) 10/16/2023   Screening examination for venereal disease 04/08/2023   Rectal pain 04/08/2023   Encounter for long-term (current) use of medications 04/08/2023   Latent syphilis 11/11/2022   Nasal septal deviation 04/05/2020   Renal insufficiency 06/15/2018   Healthcare maintenance 11/04/2017   Depression 10/27/2017   Former smoker 12/08/2013   Positive hepatitis C antibody test 11/18/2013   Thrombocytopenia, unspecified 11/17/2013   Epistaxis 11/17/2013   Normocytic anemia 11/17/2013   Chronic otitis media 11/17/2013   Transaminitis 11/17/2013   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   APIXABAN  (ELIQUIS ) 5 MG TABS TABLET    Take 1 tablet (5 mg total) by mouth 2 (two) times daily.   CABOTEGRAVIR  & RILPIVIRINE  ER (CABENUVA ) 600 & 900 MG/3ML INJECTION    Inject 1 kit into the muscle every 2 (two) months.    CARVEDILOL  (COREG ) 25 MG TABLET    Take 1 tablet (25 mg total) by mouth 2 (two) times daily.   DOXAZOSIN  (CARDURA ) 1 MG TABLET    Take 1 tablet (1 mg total) by mouth daily.   ISOSORBIDE -HYDRALAZINE  (BIDIL ) 20-37.5 MG TABLET    Take 2 tablets by mouth 3 (three) times daily.   TORSEMIDE  (DEMADEX ) 20 MG TABLET    Take 2 tablets (40mg ) daily if needed for weight gain above 3 pounds from your baseline weight in 24 hours or 5 pounds above your dry weight in a week, check weight daily  Modified Medications   No medications on file  Discontinued Medications   No medications on file    Allergies: No Known Allergies  Past Medical History: Past Medical History:  Diagnosis Date   HIV (human immunodeficiency virus infection) (HCC)    ITP (idiopathic thrombocytopenic purpura) 11/24/2013    Social History: Social History   Socioeconomic History   Marital status: Single    Spouse name: Not on file   Number of children: Not on file   Years of education: Not on file   Highest education level: Not on file  Occupational History   Not on file  Tobacco Use   Smoking status: Former    Current packs/day: 0.00    Average packs/day: 0.1 packs/day for 12.5 years (1.2 ttl pk-yrs)    Types: Cigarettes    Start date: 12/30/2001    Quit date: 06/26/2014    Years since quitting: 10.1   Smokeless tobacco: Never   Tobacco comments:  pt states he wants to quit by his birthday in July  Vaping Use   Vaping status: Never Used  Substance and Sexual Activity   Alcohol use: No   Drug use: No   Sexual activity: Not Currently    Partners: Female, Male    Birth control/protection: Condom    Comment: declined condoms  Other Topics Concern   Not on file  Social History Narrative   Caffiene none   Working :  [employer]   Live alone, no pets.    Social Drivers of Corporate Investment Banker Strain: Not on file  Food Insecurity: No Food Insecurity (10/16/2023)   Hunger Vital Sign    Worried About Running  Out of Food in the Last Year: Never true    Ran Out of Food in the Last Year: Never true  Transportation Needs: No Transportation Needs (10/16/2023)   PRAPARE - Administrator, Civil Service (Medical): No    Lack of Transportation (Non-Medical): No  Physical Activity: Not on file  Stress: Not on file  Social Connections: Not on file    Labs: Lab Results  Component Value Date   HIV1RNAQUANT NOT DETECTED 02/15/2024   HIV1RNAQUANT NOT DETECTED 10/21/2023   HIV1RNAQUANT Not Detected 08/26/2023   CD4TABS 486 02/15/2024   CD4TABS 509 12/03/2022   CD4TABS 383 (L) 06/27/2021    RPR and STI Lab Results  Component Value Date   LABRPR REACTIVE (A) 10/21/2023   LABRPR REACTIVE (A) 04/08/2023   LABRPR REACTIVE (A) 12/03/2022   LABRPR REACTIVE (A) 06/27/2021   LABRPR NON-REACTIVE 03/26/2020   RPRTITER 1:1 (H) 10/21/2023   RPRTITER 1:1 (H) 04/08/2023   RPRTITER 1:2 (H) 12/03/2022   RPRTITER 1:2 (H) 06/27/2021    STI Results GC GC CT CT  Latest Ref Rng & Units  NEGATIVE  NEGATIVE  10/21/2023 10:52 AM Negative    Negative    Negative   Negative    Negative    Negative    04/08/2023 10:25 AM Negative    Negative   Negative    Negative    04/02/2023  8:52 AM Negative   Negative    09/01/2018 12:00 AM Negative   Negative    04/22/2017 12:00 AM Negative   Negative    11/17/2013  2:00 PM  NEGATIVE   NEGATIVE     Hepatitis B Lab Results  Component Value Date   HEPBSAB POSITIVE (A) 11/17/2013   HEPBSAG NEGATIVE 11/18/2013   Hepatitis C Lab Results  Component Value Date   HEPCAB NON-REACTIVE 11/02/2023   Hepatitis A Lab Results  Component Value Date   HAV Reactive (A) 11/17/2013   Lipids: Lab Results  Component Value Date   CHOL 235 (H) 05/04/2024   TRIG 123 05/04/2024   HDL 47 05/04/2024   CHOLHDL 5.0 05/04/2024   VLDL 40 (H) 04/22/2017   LDLCALC 166 (H) 05/04/2024    TARGET DATE:  The 21st of the month  Assessment: [name] presents today for their  maintenance Cabenuva  injections. Initial/past injections were tolerated well without issues. No problems with systemic effects of injections. Initially scheduled with Dr. Dea today but will adjust follow up with her in January.    Administered cabotegravir  600mg /37mL in left upper outer quadrant of the gluteal muscle. Administered rilpivirine  900 mg/3mL in the right upper outer quadrant of the gluteal muscle. Monitored patient for 10 minutes after injection. Injections were tolerated well without issue. Patient will follow up in 2 months for next  injection. Will check HIV RNA today.  Eligible for flu, COVID, and Shingles vaccines which he declines today. Also declines STI testing as he has not been active for ~1 year.   Plan: - Administer Cabenuva  injections  - Check HIV RNA - Next injections scheduled for 1/14 with Dr. Dea and 3/16 with Cassie  - Call with any issues or questions  Alan Geralds, PharmD, CPP, BCIDP, AAHIVP Clinical Pharmacist Practitioner Infectious Diseases Clinical Pharmacist [facility]

## 2024-08-25 LAB — HIV-1 RNA QUANT-NO REFLEX-BLD
HIV 1 RNA Quant: NOT DETECTED {copies}/mL
HIV-1 RNA Quant, Log: NOT DETECTED {Log_copies}/mL

## 2024-10-18 ENCOUNTER — Other Ambulatory Visit: Payer: Self-pay

## 2024-10-18 ENCOUNTER — Other Ambulatory Visit (HOSPITAL_COMMUNITY): Payer: Self-pay

## 2024-10-18 NOTE — Progress Notes (Signed)
 Specialty Pharmacy Refill Coordination Note  [name] is a 39 y.o. male assessed today regarding refills of clinic administered specialty medication(s) Cabotegravir  & Rilpivirine  (CABENUVA )   Clinic requested Courier to Provider Office   Delivery date: 10/20/24   Verified address: [address] [place] [address]   Medication will be filled on 10/19/24.

## 2024-10-19 ENCOUNTER — Encounter: Payer: Self-pay | Admitting: Infectious Diseases

## 2024-10-19 ENCOUNTER — Other Ambulatory Visit (HOSPITAL_COMMUNITY)
Admission: RE | Admit: 2024-10-19 | Discharge: 2024-10-19 | Disposition: A | Discharge status: Home / Self Care | Source: Ambulatory Visit | Attending: Infectious Diseases | Admitting: Infectious Diseases

## 2024-10-19 ENCOUNTER — Other Ambulatory Visit (HOSPITAL_COMMUNITY): Payer: Self-pay

## 2024-10-19 ENCOUNTER — Other Ambulatory Visit: Payer: Self-pay

## 2024-10-19 ENCOUNTER — Ambulatory Visit (INDEPENDENT_AMBULATORY_CARE_PROVIDER_SITE_OTHER): Admitting: Infectious Diseases

## 2024-10-19 VITALS — BP 189/147 | HR 99 | Temp 98.9°F | Ht 73.0 in | Wt 231.0 lb

## 2024-10-19 DIAGNOSIS — Z0000 Encounter for general adult medical examination without abnormal findings: Secondary | ICD-10-CM | POA: Insufficient documentation

## 2024-10-19 DIAGNOSIS — B20 Human immunodeficiency virus [HIV] disease: Secondary | ICD-10-CM | POA: Diagnosis not present

## 2024-10-19 DIAGNOSIS — I130 Hypertensive heart and chronic kidney disease with heart failure and stage 1 through stage 4 chronic kidney disease, or unspecified chronic kidney disease: Secondary | ICD-10-CM | POA: Diagnosis not present

## 2024-10-19 DIAGNOSIS — Z79899 Other long term (current) drug therapy: Secondary | ICD-10-CM | POA: Diagnosis not present

## 2024-10-19 DIAGNOSIS — Z113 Encounter for screening for infections with a predominantly sexual mode of transmission: Secondary | ICD-10-CM | POA: Insufficient documentation

## 2024-10-19 DIAGNOSIS — Z7185 Encounter for immunization safety counseling: Secondary | ICD-10-CM | POA: Insufficient documentation

## 2024-10-19 DIAGNOSIS — F32A Depression, unspecified: Secondary | ICD-10-CM

## 2024-10-19 DIAGNOSIS — Z5181 Encounter for therapeutic drug level monitoring: Secondary | ICD-10-CM

## 2024-10-19 MED ORDER — ATORVASTATIN CALCIUM 10 MG PO TABS: 10.0000 mg | ORAL_TABLET | Freq: Every day | ORAL | 5 refills | Status: AC

## 2024-10-19 MED ORDER — CABOTEGRAVIR & RILPIVIRINE ER 600 & 900 MG/3ML IM SUER
1.0000 | Freq: Once | INTRAMUSCULAR | Status: AC
  Administered 2024-10-19: 1 via INTRAMUSCULAR

## 2024-10-19 NOTE — Progress Notes (Signed)
 "                                                                                                                                                                                                      [address], [place], [place], [address]                                                                  Phn. [phone]; Fax: [phone]                                                                             Date: 10/21/24  Reason for Visit: Routine HIV care.   HPI: [name] is a 39 y.o.old male with a history of HIV on Biktarvy , switched to cobanuva injections starting 05/27/23, HCV ab reactive ( HCV RNA negative in 2015 and 2016), Depression, syphilis who is here for routine fu.  1/14 No issues with cabenuva  injections so far. Declined vaccines, anal pap but Ok to start atorvastatin  for lipids. Denies smoking, alcohol. No mental health concerns.  ROS: As stated in above HPI; all other systems were reviewed and are otherwise negative unless noted below  No reported fever / chills, night sweats, unintentional weight loss, acute visual change, odynophagia, chest pain/pressure, new or worsened SOB or WOB, nausea, vomiting, diarrhea, dysuria, GU discharge, syncope, seizures, red/hot swollen joints, hallucinations / delusions, rashes, new allergies, unusual / excessive bleeding, swollen lymph nodes  PMH/ PSH/ FamHx / Social Hx , medications and allergies reviewed and updated as appropriate; please see corresponding tab in EHR / prior notes                                        Current Outpatient Medications on File Prior to Visit  Medication Sig Dispense Refill   amLODipine  (NORVASC ) 10 MG tablet Take 1 tablet (10 mg total) by mouth daily. 90 tablet 0   apixaban  (ELIQUIS ) 5 MG TABS tablet  Take 1 tablet (5 mg total) by mouth 2 (two) times daily. 60 tablet 5   cabotegravir  & rilpivirine  ER (CABENUVA ) 600 & 900 MG/3ML injection Inject 1 kit into the muscle every 2 (two) months. 6 mL  5   carvedilol  (COREG ) 25 MG tablet Take 1 tablet (25 mg total) by mouth 2 (two) times daily. 180 tablet 1   doxazosin  (CARDURA ) 1 MG tablet Take 1 tablet (1 mg total) by mouth daily. 30 tablet 11   isosorbide -hydrALAZINE  (BIDIL ) 20-37.5 MG tablet Take 2 tablets by mouth 3 (three) times daily. 540 tablet 0   torsemide  (DEMADEX ) 20 MG tablet Take 2 tablets (40mg ) daily if needed for weight gain above 3 pounds from your baseline weight in 24 hours or 5 pounds above your dry weight in a week, check weight daily 180 tablet 1   No current facility-administered medications on file prior to visit.    No Known Allergies  Past Medical History:  Diagnosis Date   HIV (human immunodeficiency virus infection) (HCC)    ITP (idiopathic thrombocytopenic purpura) 11/24/2013    Past Surgical History:  Procedure Laterality Date   ADENOIDECTOMY N/A 11/16/2013   Procedure: ADENOIDECTOMY;  Surgeon: Marlyce Finer, MD;  Location: [facility] OR;  Service: ENT;  Laterality: N/A;   MYRINGOTOMY WITH TUBE PLACEMENT Bilateral 11/16/2013   Procedure: MYRINGOTOMY WITH TUBE PLACEMENT;  Surgeon: Marlyce Finer, MD;  Location: [facility] OR;  Service: ENT;  Laterality: Bilateral;   NASAL ENDOSCOPY WITH EPISTAXIS CONTROL N/A 11/16/2013   Procedure: NASAL ENDOSCOPY with nasopharyngeal biopsy with frozen section and adnoidectomy;  Surgeon: Marlyce Finer, MD;  Location: [facility] OR;  Service: ENT;  Laterality: N/A;   RIGHT HEART CATH N/A 10/19/2023   Procedure: RIGHT HEART CATH;  Surgeon: Rolan Ezra RAMAN, MD;  Location: [facility] INVASIVE CV LAB;  Service: Cardiovascular;  Laterality: N/A;    Social History   Socioeconomic History   Marital status: Single    Spouse name: Not on file   Number of children: Not on file   Years of education: Not on file   Highest education level: Not on file  Occupational History   Not on file  Tobacco Use   Smoking status: Former    Current packs/day: 0.00    Average packs/day: 0.1 packs/day for 12.5 years (1.2 ttl  pk-yrs)    Types: Cigarettes    Start date: 12/30/2001    Quit date: 06/26/2014    Years since quitting: 10.3   Smokeless tobacco: Never   Tobacco comments:    pt states he wants to quit by his birthday in July  Vaping Use   Vaping status: Never Used  Substance and Sexual Activity   Alcohol use: No   Drug use: No   Sexual activity: Not Currently    Partners: Female, Male    Birth control/protection: Condom    Comment: declined condoms  Other Topics Concern   Not on file  Social History Narrative   Caffiene none   Working :  [employer]   Live alone, no pets.    Social Drivers of Health   Tobacco Use: Medium Risk (10/19/2024)   Patient History    Smoking Tobacco Use: Former    Smokeless Tobacco Use: Never    Passive Exposure: Not on file  Financial Resource Strain: Not on file  Food Insecurity: No Food Insecurity (10/16/2023)   Hunger Vital Sign    Worried About Running Out of Food in the Last Year: Never true    Ran  Out of Food in the Last Year: Never true  Transportation Needs: No Transportation Needs (10/16/2023)   PRAPARE - Administrator, Civil Service (Medical): No    Lack of Transportation (Non-Medical): No  Physical Activity: Not on file  Stress: Not on file  Social Connections: Not on file  Intimate Partner Violence: Not At Risk (10/16/2023)   Humiliation, Afraid, Rape, and Kick questionnaire    Fear of Current or Ex-Partner: No    Emotionally Abused: No    Physically Abused: No    Sexually Abused: No  Depression (PHQ2-9): Low Risk (05/12/2024)   Depression (PHQ2-9)    PHQ-2 Score: 0  Alcohol Screen: Not on file  Housing: Low Risk (10/16/2023)   Housing Stability Vital Sign    Unable to Pay for Housing in the Last Year: No    Number of Times Moved in the Last Year: 1    Homeless in the Last Year: No  Utilities: Not At Risk (10/16/2023)   AHC Utilities    Threatened with loss of utilities: No  Health Literacy: Not on file   Family History  Problem  Relation Age of Onset   Hypertension Neg Hx    Diabetes Neg Hx     Vitals  BP (!) 189/147   Pulse 99   Temp 98.9 F (37.2 C) (Oral)   Ht 6' 1 (1.854 m)   Wt 231 lb (104.8 kg)   SpO2 99%   BMI 30.48 kg/m   Examination  Gen: no acute distress HEENT: West /AT, no scleral icterus, no pale conjunctivae, hearing normal, oral mucosa moist Neck: Supple Cardio: Normal heart rate Resp: Pulmonary effort normal in room air GI: nondistended GU: Musc: Extremities: No pedal edema Skin: No rashes Neuro: grossly non focal , awake, alert Psych: Calm, cooperative  Lab Results HIV 1 RNA Quant (copies/mL)  Date Value  08/23/2024 NOT DETECTED  02/15/2024 NOT DETECTED  10/21/2023 NOT DETECTED   CD4 T Cell Abs (/uL)  Date Value  02/15/2024 486  12/03/2022 509  06/27/2021 383 (L)   No results found for: HIV1GENOSEQ Lab Results  Component Value Date   WBC 6.2 05/04/2024   HGB 12.5 (L) 05/04/2024   HCT 39.3 05/04/2024   MCV 89 05/04/2024   PLT 144 (L) 05/04/2024    Lab Results  Component Value Date   CREATININE 4.49 (H) 05/11/2024   BUN 29 (H) 05/11/2024   NA 139 05/11/2024   K 4.7 05/11/2024   CL 104 05/11/2024   CO2 19 (L) 05/11/2024   Lab Results  Component Value Date   ALT 11 05/04/2024   AST 24 05/04/2024   ALKPHOS 124 (H) 05/04/2024   BILITOT 1.1 05/04/2024    Lab Results  Component Value Date   CHOL 235 (H) 05/04/2024   TRIG 123 05/04/2024   HDL 47 05/04/2024   LDLCALC 166 (H) 05/04/2024   Lab Results  Component Value Date   HAV Reactive (A) 11/17/2013   Lab Results  Component Value Date   HEPBSAG NEGATIVE 11/18/2013   HEPBSAB POSITIVE (A) 11/17/2013   Lab Results  Component Value Date   HCVAB Reactive (A) 11/18/2013   Lab Results  Component Value Date   CHLAMYDIAWP Negative 10/21/2023   CHLAMYDIAWP Negative 10/21/2023   N Negative 10/21/2023   N Negative 10/21/2023   Lab Results  Component Value Date   GCPROBEAPT NEGATIVE 11/17/2013    Lab Results  Component Value Date   QUANTGOLD INDETERM 11/17/2013  Health Maintenance: Immunization History  Administered Date(s) Administered   HPV 9-valent 06/24/2023, 08/26/2023, 02/22/2024   Influenza, Seasonal, Injecte, Preservative Fre 08/26/2023   Influenza,inj,Quad PF,6+ Mos 11/18/2013, 06/27/2014, 10/27/2017, 09/16/2018, 06/27/2021   Meningococcal Mcv4o 05/27/2023, 10/21/2023   PNEUMOCOCCAL CONJUGATE-20 05/27/2023   Pfizer Covid-19 Vaccine Bivalent Booster 88yrs & up 06/27/2021   Pfizer(Comirnaty)Fall Seasonal Vaccine 12 years and older 08/26/2023   Pneumococcal Polysaccharide-23 11/18/2013   Tdap 11/04/2017, 12/20/2018    Assessment/Plan: # HIV - Cabenuva  today  - Labs today  - Fu in 2 months for next dose - Aim to see me every 5 to 6 months at least for HIV labs  # CKD/CHF # HTN, uncontrolled  - He is on appropriate medications - No symptoms or signs of hypertensive emergency or urgency - Advised to recheck his blood pressure at home and call PCPs office if still elevated  - follows with Nephrology and Cardiology   # Depression - Stable  # STD Screening  - urine/oral GC and RPR. Anal GC declined  # Immunization - Declined vaccines altogether  # Health maintenance -Lipid panel, started on atorvastatin , ASVD score more than 10% -Declined anal Pap, next time  -Dental care has been discussed.   I personally spent a total of 31 minutes in the care of the patient today including preparing to see the patient, getting/reviewing separately obtained history, performing a medically appropriate exam/evaluation, counseling and educating, placing orders, documenting clinical information in the EHR, independently interpreting results, and communicating results.  Electronically signed by: Annalee Orem, MD Infectious Disease Physician [facility] [address] [place], [place] [address] Phone: [phone]   Fax: [phone]      "

## 2024-10-19 NOTE — Progress Notes (Signed)
 BP check x2- reading remained elevated. Informed MD. Pt will recheck at home after an hour- will contact PCP to discuss BP medication.

## 2024-10-20 ENCOUNTER — Telehealth: Payer: Self-pay

## 2024-10-20 ENCOUNTER — Ambulatory Visit: Payer: Self-pay | Admitting: Infectious Diseases

## 2024-10-20 LAB — URINE CYTOLOGY ANCILLARY ONLY
Chlamydia: NEGATIVE
Comment: NEGATIVE
Comment: NORMAL
Neisseria Gonorrhea: NEGATIVE

## 2024-10-20 LAB — T-HELPER CELLS (CD4) COUNT (NOT AT ARMC)
CD4 % Helper T Cell: 33 % (ref 33–65)
CD4 T Cell Abs: 342 /uL — ABNORMAL LOW (ref 400–1790)

## 2024-10-20 LAB — CYTOLOGY, (ORAL, ANAL, URETHRAL) ANCILLARY ONLY
Chlamydia: NEGATIVE
Comment: NEGATIVE
Comment: NORMAL
Neisseria Gonorrhea: NEGATIVE

## 2024-10-20 NOTE — Telephone Encounter (Signed)
 [facility] Patient Advocate Encounter  Patient's medications Cabenuva  have been couriered to [facility] from [facility] and will be administered at the patients appointment on 10/19/24.  Arland Hutchinson, CPhT Specialty Pharmacy Patient [facility] for Infectious Disease Phone: [phone] Fax:  [phone]

## 2024-10-20 NOTE — Progress Notes (Signed)
 Attempted to call- left voicemail requesting he check mychart for information.

## 2024-10-22 LAB — CBC
HCT: 33.9 % — ABNORMAL LOW (ref 39.4–51.1)
Hemoglobin: 10.7 g/dL — ABNORMAL LOW (ref 13.2–17.1)
MCH: 27.8 pg (ref 27.0–33.0)
MCHC: 31.6 g/dL (ref 31.6–35.4)
MCV: 88.1 fL (ref 81.4–101.7)
MPV: 11.4 fL (ref 7.5–12.5)
Platelets: 218 Thousand/uL (ref 140–400)
RBC: 3.85 Million/uL — ABNORMAL LOW (ref 4.20–5.80)
RDW: 13.6 % (ref 11.0–15.0)
WBC: 5.9 Thousand/uL (ref 3.8–10.8)

## 2024-10-22 LAB — COMPREHENSIVE METABOLIC PANEL WITH GFR
AG Ratio: 1.1 (calc) (ref 1.0–2.5)
ALT: 18 U/L (ref 9–46)
AST: 18 U/L (ref 10–40)
Albumin: 3.5 g/dL — ABNORMAL LOW (ref 3.6–5.1)
Alkaline phosphatase (APISO): 111 U/L (ref 36–130)
BUN/Creatinine Ratio: 7 (calc) (ref 6–22)
BUN: 51 mg/dL — ABNORMAL HIGH (ref 7–25)
CO2: 20 mmol/L (ref 20–32)
Calcium: 8.6 mg/dL (ref 8.6–10.3)
Chloride: 106 mmol/L (ref 98–110)
Creat: 7.29 mg/dL — ABNORMAL HIGH (ref 0.60–1.29)
Globulin: 3.1 g/dL (ref 1.9–3.7)
Glucose, Bld: 89 mg/dL (ref 65–99)
Potassium: 4.1 mmol/L (ref 3.5–5.3)
Sodium: 138 mmol/L (ref 135–146)
Total Bilirubin: 0.9 mg/dL (ref 0.2–1.2)
Total Protein: 6.6 g/dL (ref 6.1–8.1)
eGFR: 9 mL/min/1.73m2 — ABNORMAL LOW

## 2024-10-22 LAB — LIPID PANEL
Cholesterol: 187 mg/dL
HDL: 39 mg/dL — ABNORMAL LOW
LDL Cholesterol (Calc): 127 mg/dL — ABNORMAL HIGH
Non-HDL Cholesterol (Calc): 148 mg/dL — ABNORMAL HIGH
Total CHOL/HDL Ratio: 4.8 (calc)
Triglycerides: 103 mg/dL

## 2024-10-22 LAB — HIV RNA, RTPCR W/R GT (RTI, PI,INT)
HIV 1 RNA Quant: NOT DETECTED {copies}/mL
HIV-1 RNA Quant, Log: NOT DETECTED {Log_copies}/mL

## 2024-10-22 LAB — T PALLIDUM AB: T Pallidum Abs: POSITIVE — AB

## 2024-10-22 LAB — RPR TITER: RPR Titer: 1:1 {titer} — ABNORMAL HIGH

## 2024-10-22 LAB — SYPHILIS: RPR W/REFLEX TO RPR TITER AND TREPONEMAL ANTIBODIES, TRADITIONAL SCREENING AND DIAGNOSIS ALGORITHM: RPR Ser Ql: REACTIVE — AB

## 2024-10-24 ENCOUNTER — Ambulatory Visit: Admitting: Pharmacist

## 2024-12-19 ENCOUNTER — Ambulatory Visit: Admitting: Pharmacist
